# Patient Record
Sex: Female | Born: 1990 | Race: Black or African American | Hispanic: No | Marital: Single | Smoking: Never smoker
Health system: Southern US, Community
[De-identification: ages and names within clinical notes are randomized; demographics above are authoritative.]

## PROBLEM LIST (undated history)

## (undated) ENCOUNTER — Inpatient Hospital Stay (HOSPITAL_COMMUNITY): Payer: Self-pay

## (undated) ENCOUNTER — Emergency Department (HOSPITAL_COMMUNITY): Admission: EM | Payer: Medicaid Other

## (undated) ENCOUNTER — Ambulatory Visit (HOSPITAL_COMMUNITY): Admission: EM | Payer: Medicaid Other | Source: Home / Self Care

## (undated) DIAGNOSIS — R87610 Atypical squamous cells of undetermined significance on cytologic smear of cervix (ASC-US): Secondary | ICD-10-CM

## (undated) DIAGNOSIS — R87810 Cervical high risk human papillomavirus (HPV) DNA test positive: Secondary | ICD-10-CM

## (undated) DIAGNOSIS — B009 Herpesviral infection, unspecified: Secondary | ICD-10-CM

## (undated) DIAGNOSIS — Z3490 Encounter for supervision of normal pregnancy, unspecified, unspecified trimester: Secondary | ICD-10-CM

## (undated) HISTORY — PX: EYE SURGERY: SHX253

## (undated) HISTORY — PX: TOOTH EXTRACTION: SUR596

## (undated) HISTORY — DX: Herpesviral infection, unspecified: B00.9

## (undated) HISTORY — DX: Cervical high risk human papillomavirus (HPV) DNA test positive: R87.810

## (undated) HISTORY — DX: Atypical squamous cells of undetermined significance on cytologic smear of cervix (ASC-US): R87.610

---
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
## 2008-03-12 ENCOUNTER — Other Ambulatory Visit: Admission: RE | Admit: 2008-03-12 | Discharge: 2008-03-12 | Payer: Self-pay | Admitting: Family Medicine

## 2009-03-17 ENCOUNTER — Other Ambulatory Visit: Admission: RE | Admit: 2009-03-17 | Discharge: 2009-03-17 | Payer: Self-pay | Admitting: Family Medicine

## 2009-05-18 ENCOUNTER — Emergency Department (HOSPITAL_COMMUNITY): Admission: EM | Admit: 2009-05-18 | Discharge: 2009-05-18 | Payer: Self-pay | Admitting: Emergency Medicine

## 2010-02-11 ENCOUNTER — Inpatient Hospital Stay (INDEPENDENT_AMBULATORY_CARE_PROVIDER_SITE_OTHER)
Admission: RE | Admit: 2010-02-11 | Discharge: 2010-02-11 | Disposition: A | Discharge status: Home / Self Care | Payer: Medicaid Other | Source: Ambulatory Visit | Attending: Family Medicine | Admitting: Family Medicine

## 2010-02-11 DIAGNOSIS — J020 Streptococcal pharyngitis: Secondary | ICD-10-CM

## 2010-02-11 LAB — POCT RAPID STREP A (OFFICE): Streptococcus, Group A Screen (Direct): POSITIVE — AB

## 2010-02-12 ENCOUNTER — Emergency Department (HOSPITAL_COMMUNITY)
Admission: EM | Admit: 2010-02-12 | Discharge: 2010-02-12 | Disposition: A | Discharge status: Home / Self Care | Payer: Medicaid Other | Attending: Emergency Medicine | Admitting: Emergency Medicine

## 2010-02-12 DIAGNOSIS — R220 Localized swelling, mass and lump, head: Secondary | ICD-10-CM | POA: Insufficient documentation

## 2010-02-12 DIAGNOSIS — R599 Enlarged lymph nodes, unspecified: Secondary | ICD-10-CM | POA: Insufficient documentation

## 2010-02-12 DIAGNOSIS — J3489 Other specified disorders of nose and nasal sinuses: Secondary | ICD-10-CM | POA: Insufficient documentation

## 2010-02-12 DIAGNOSIS — R509 Fever, unspecified: Secondary | ICD-10-CM | POA: Insufficient documentation

## 2010-02-12 DIAGNOSIS — R110 Nausea: Secondary | ICD-10-CM | POA: Insufficient documentation

## 2010-02-12 DIAGNOSIS — R630 Anorexia: Secondary | ICD-10-CM | POA: Insufficient documentation

## 2010-02-12 DIAGNOSIS — J020 Streptococcal pharyngitis: Secondary | ICD-10-CM | POA: Insufficient documentation

## 2010-02-12 DIAGNOSIS — F988 Other specified behavioral and emotional disorders with onset usually occurring in childhood and adolescence: Secondary | ICD-10-CM | POA: Insufficient documentation

## 2010-02-12 DIAGNOSIS — R499 Unspecified voice and resonance disorder: Secondary | ICD-10-CM | POA: Insufficient documentation

## 2010-02-12 DIAGNOSIS — R1310 Dysphagia, unspecified: Secondary | ICD-10-CM | POA: Insufficient documentation

## 2010-02-12 LAB — RAPID STREP SCREEN (MED CTR MEBANE ONLY): Streptococcus, Group A Screen (Direct): POSITIVE — AB

## 2010-03-22 LAB — STREP A DNA PROBE: Group A Strep Probe: NEGATIVE

## 2010-03-22 LAB — POCT RAPID STREP A (OFFICE): Streptococcus, Group A Screen (Direct): NEGATIVE

## 2010-03-24 ENCOUNTER — Other Ambulatory Visit (HOSPITAL_COMMUNITY)
Admission: RE | Admit: 2010-03-24 | Discharge: 2010-03-24 | Disposition: A | Discharge status: Home / Self Care | Payer: Medicaid Other | Source: Ambulatory Visit | Attending: Obstetrics and Gynecology | Admitting: Obstetrics and Gynecology

## 2010-03-24 ENCOUNTER — Other Ambulatory Visit: Payer: Self-pay | Admitting: Nurse Practitioner

## 2010-03-24 DIAGNOSIS — Z01419 Encounter for gynecological examination (general) (routine) without abnormal findings: Secondary | ICD-10-CM | POA: Insufficient documentation

## 2010-03-24 DIAGNOSIS — Z113 Encounter for screening for infections with a predominantly sexual mode of transmission: Secondary | ICD-10-CM | POA: Insufficient documentation

## 2010-05-21 NOTE — Op Note (Signed)
NAMESELIA, WAREING           ACCOUNT NO.:  [id number]   MEDICAL RECORD NO.:  [id number]          PATIENT TYPE:  AMB   LOCATION:  DSC                          FACILITY:  [facility]   PHYSICIAN:  Pasty Spillers. Maple Hudson, M.D. DATE OF BIRTH:  Dec 31, 1990   DATE OF PROCEDURE:  12/24/2004  DATE OF DISCHARGE:                                 OPERATIVE REPORT   PREOPERATIVE DIAGNOSIS:  Residual esotropia.   POSTOPERATIVE DIAGNOSIS:  Residual esotropia.   PROCEDURE:  Right medial rectus muscle re-recession, 3.5 mm.   SURGEON:  Pasty Spillers. Maple Hudson, M.D.   ANESTHESIA:  General (laryngeal mask).   COMPLICATIONS:  None.   PROCEDURE:  After routine prep evaluation including informed consent from  the mother, the patient was taken to the operating room, where she was  identified by me.  General anesthesia was induced without difficulty after  placement of appropriate monitors.  The patient was prepped and draped in a  standard sterile fashion.  A lid speculum placed in the right eye.   Through an inferonasal fornix incision through conjunctiva and Tenon's  fascia, the right medial rectus muscle was engaged on a series of muscle  hooks and cleared of its fascial attachments.  The tendon was secured with a  double-arm 6-0 Vicryl suture, with a double-locking bite at each border of  the muscle, 1 mm from the insertion.  The muscle was disinserted.  Its  current insertion was found to be 9.5 mm posterior to the limbus.  The  muscle was reattached to sclera at a measured distance of 3.5 mm posterior  to the current insertion (i.e. 13.0 mm posterior to the limbus), using  direct scleral passes in crossed-swords fashion.  The sutures ends were tied  securely after the position of muscle been checked and found to be accurate.  Conjunctiva was closed with two 6-0 Vicryl sutures.  TobraDex ointment was  placed in the eye.  The patient was awakened without difficulty and taken to  the recovery room in stable  condition, having suffered no intraoperative or  immediate postoperative complications.      Pasty Spillers. Maple Hudson, M.D.  Electronically Signed     WOY/MEDQ  D:  12/24/2004  T:  12/27/2004  Job:  [id number]

## 2010-10-25 ENCOUNTER — Inpatient Hospital Stay (HOSPITAL_COMMUNITY)
Admission: AD | Admit: 2010-10-25 | Discharge: 2010-10-25 | Disposition: A | Discharge status: Home / Self Care | Payer: Medicaid Other | Source: Ambulatory Visit | Attending: Obstetrics & Gynecology | Admitting: Obstetrics & Gynecology

## 2010-10-25 ENCOUNTER — Encounter (HOSPITAL_COMMUNITY): Payer: Self-pay | Admitting: *Deleted

## 2010-10-25 DIAGNOSIS — N898 Other specified noninflammatory disorders of vagina: Secondary | ICD-10-CM | POA: Insufficient documentation

## 2010-10-25 DIAGNOSIS — A5901 Trichomonal vulvovaginitis: Secondary | ICD-10-CM

## 2010-10-25 LAB — URINALYSIS, ROUTINE W REFLEX MICROSCOPIC
Bilirubin Urine: NEGATIVE
Ketones, ur: 15 mg/dL — AB
Nitrite: NEGATIVE
Protein, ur: NEGATIVE mg/dL
Urobilinogen, UA: 0.2 mg/dL (ref 0.0–1.0)

## 2010-10-25 LAB — URINE MICROSCOPIC-ADD ON

## 2010-10-25 LAB — WET PREP, GENITAL: Clue Cells Wet Prep HPF POC: NONE SEEN

## 2010-10-25 MED ORDER — METRONIDAZOLE 500 MG PO TABS: 500.0000 mg | ORAL_TABLET | Freq: Two times a day (BID) | ORAL | Status: AC

## 2010-10-25 NOTE — Progress Notes (Signed)
Has not come on period, lmp 09/15, did home test was neg.  Thinks yeast infection, odor, itching and d/c.  Has tried OTC treatment and it is not helping.  Throat is scratchy and red- wants throat culture.

## 2010-10-25 NOTE — ED Provider Notes (Signed)
History     CSN: [id number] Arrival date & time: 10/25/2010  3:46 PM   None     Chief Complaint  Patient presents with  . Vaginal Discharge   HPI Diana Jacobs is a 20 y.o. female who presents to MAU for vaginal itching burning and irritation that started 3 weeks. The symptoms have gotten worse. Patient states that it feels like a yeast infection but much worse. The discharge is watery and causing burning in the vaginal area. Current sex partner x 7 months.   Past Medical History  Diagnosis Date  . No pertinent past medical history     Past Surgical History  Procedure Date  . Eye surgery   . Tooth extraction     No family history on file.  History  Substance Use Topics  . Smoking status: Never Smoker   . Smokeless tobacco: Not on file  . Alcohol Use: No    OB History    Grav Para Term Preterm Abortions TAB SAB Ect Mult Living   0               Review of Systems  HENT: Negative.   Eyes: Negative.   Respiratory: Negative.   Cardiovascular: Negative.   Gastrointestinal: Negative for nausea, vomiting, abdominal pain, diarrhea and constipation.  Genitourinary: Positive for dysuria, vaginal discharge and vaginal pain. Negative for urgency, frequency, vaginal bleeding and pelvic pain.  Musculoskeletal: Negative for back pain.  Skin: Negative.   Neurological: Negative.   Psychiatric/Behavioral: Negative for confusion and agitation.    Allergies  Review of patient's allergies indicates no known allergies.  Home Medications  No current outpatient prescriptions on file.  BP 117/59  Pulse 82  Temp(Src) 97.8 F (36.6 C) (Oral)  Resp 18  Ht 5' 4.5" (1.638 m)  Wt 196 lb (88.905 kg)  BMI 33.12 kg/m2  LMP 09/18/2010  Physical Exam  Nursing note and vitals reviewed. Constitutional: She is oriented to person, place, and time. She appears well-developed and well-nourished. No distress.  HENT:  Head: Normocephalic.  Eyes: EOM are normal.  Neck: Neck supple.   Cardiovascular: Normal rate.   Pulmonary/Chest: Effort normal.  Abdominal: Soft. There is no tenderness.  Genitourinary:       External genitalia without lesions. Frothy yellow vaginal discharge. Cervix friable with strawberry appearence. No CMT, no adnexal tenderness. Uterus without palpable enlargement.   Musculoskeletal: Normal range of motion.  Neurological: She is alert and oriented to person, place, and time. No cranial nerve deficit.  Skin: Skin is warm and dry.    ED Course  Procedures    Results for orders placed during the hospital encounter of 10/25/10 (from the past 24 hour(s))  URINALYSIS, ROUTINE W REFLEX MICROSCOPIC     Status: Abnormal   Collection Time   10/25/10  4:30 PM      Component Value Range   Color, Urine YELLOW  YELLOW    Appearance HAZY (*) CLEAR    Specific Gravity, Urine >1.030 (*) 1.005 - 1.030    pH 6.0  5.0 - 8.0    Glucose, UA NEGATIVE  NEGATIVE (mg/dL)   Hgb urine dipstick TRACE (*) NEGATIVE    Bilirubin Urine NEGATIVE  NEGATIVE    Ketones, ur 15 (*) NEGATIVE (mg/dL)   Protein, ur NEGATIVE  NEGATIVE (mg/dL)   Urobilinogen, UA 0.2  0.0 - 1.0 (mg/dL)   Nitrite NEGATIVE  NEGATIVE    Leukocytes, UA MODERATE (*) NEGATIVE   URINE MICROSCOPIC-ADD ON  Status: Abnormal   Collection Time   10/25/10  4:30 PM      Component Value Range   Squamous Epithelial / LPF FEW (*) RARE    WBC, UA 7-10  <3 (WBC/hpf)   RBC / HPF 0-2  <3 (RBC/hpf)   Bacteria, UA FEW (*) RARE    Urine-Other TRICHOMONAS PRESENT    POCT PREGNANCY, URINE     Status: Normal   Collection Time   10/25/10  4:40 PM      Component Value Range   Preg Test, Ur NEGATIVE    WET PREP, GENITAL     Status: Abnormal   Collection Time   10/25/10  4:50 PM      Component Value Range   Yeast, Wet Prep NONE SEEN  NONE SEEN    Trich, Wet Prep MANY (*) NONE SEEN    Clue Cells, Wet Prep NONE SEEN  NONE SEEN    WBC, Wet Prep HPF POC MODERATE (*) NONE SEEN     Assessment:  Trichomonas  vaginitis  Plan:  Flagyl 500 mg. Po bid x 7 days   Discussed need for partner treatment   GC, Chlamydia cultures pending   Follow up with STD clinic  MDM          Kerrie Buffalo, NP 10/25/10 1732

## 2011-01-24 ENCOUNTER — Encounter (HOSPITAL_COMMUNITY): Payer: Self-pay | Admitting: *Deleted

## 2011-01-24 ENCOUNTER — Inpatient Hospital Stay (HOSPITAL_COMMUNITY)
Admission: AD | Admit: 2011-01-24 | Discharge: 2011-01-24 | Disposition: A | Discharge status: Home / Self Care | Payer: Medicaid Other | Source: Ambulatory Visit | Attending: Obstetrics & Gynecology | Admitting: Obstetrics & Gynecology

## 2011-01-24 DIAGNOSIS — O99891 Other specified diseases and conditions complicating pregnancy: Secondary | ICD-10-CM | POA: Insufficient documentation

## 2011-01-24 DIAGNOSIS — Z3400 Encounter for supervision of normal first pregnancy, unspecified trimester: Secondary | ICD-10-CM

## 2011-01-24 MED ORDER — PRENATAL RX 60-1 MG PO TABS: 1.0000 | ORAL_TABLET | Freq: Every day | ORAL | Status: DC

## 2011-01-24 NOTE — ED Provider Notes (Signed)
Diana Trinka Hammonds20 y.o.G2P0 @[redacted]w[redacted]d  Chief Complaint  Patient presents with  . Possible Pregnancy    SUBJECTIVE  HPI:  Had pos HPT and requests pregnnacy verification letter. No vaginal bleeding or abd pain. Happy about pregnancy.   Past Medical History  Diagnosis Date  . No pertinent past medical history    Past Surgical History  Procedure Date  . Eye surgery   . Tooth extraction    History   Social History  . Marital Status: Single    Spouse Name: N/A    Number of Children: N/A  . Years of Education: N/A   Occupational History  . Not on file.   Social History Main Topics  . Smoking status: Never Smoker   . Smokeless tobacco: Not on file  . Alcohol Use: No  . Drug Use: No  . Sexually Active: Yes   Other Topics Concern  . Not on file   Social History Narrative  . No narrative on file   No current facility-administered medications on file prior to encounter.   Current Outpatient Prescriptions on File Prior to Encounter  Medication Sig Dispense Refill  . miconazole (MONISTAT 7) 100 MG vaginal suppository Place 100 mg vaginally at bedtime. Patient states that she was using this medication for a yeast infections.       No Known Allergies  ROS: Pertinent items in HPI  OBJECTIVE  BP 127/67  Pulse 91  Temp(Src) 99.2 F (37.3 C) (Oral)  Resp 16  Ht 5\' 4"  (1.626 m)  Wt 92.352 kg (203 lb 9.6 oz)  BMI 34.95 kg/m2  SpO2 99%  LMP 12/15/2010  Complete PE deferred NAD Abd soft, NT  Results for orders placed during the hospital encounter of 01/24/11 (from the past 24 hour(s))  POCT PREGNANCY, URINE     Status: Normal   Collection Time   01/24/11  5:23 PM      Component Value Range   Preg Test, Ur POSITIVE     ASSESSMENT   Early pregnancy [redacted]w[redacted]d by LMP  PLAN  Preg verification letter, PNV Rx, pregnancy precautions, list of providers given

## 2011-01-24 NOTE — Progress Notes (Signed)
Patient states she had a positive pregnancy test yesterday at home, wants confirmation. Had vomiting one time on 1-19 and has some nausea on and off. Able to eat and drink with no problems.

## 2011-01-31 ENCOUNTER — Encounter (HOSPITAL_COMMUNITY): Payer: Self-pay | Admitting: *Deleted

## 2011-01-31 ENCOUNTER — Inpatient Hospital Stay (HOSPITAL_COMMUNITY): Payer: Medicaid Other

## 2011-01-31 ENCOUNTER — Inpatient Hospital Stay (HOSPITAL_COMMUNITY)
Admission: AD | Admit: 2011-01-31 | Discharge: 2011-01-31 | Disposition: A | Discharge status: Home / Self Care | Payer: Medicaid Other | Source: Ambulatory Visit | Attending: Obstetrics & Gynecology | Admitting: Obstetrics & Gynecology

## 2011-01-31 DIAGNOSIS — B9689 Other specified bacterial agents as the cause of diseases classified elsewhere: Secondary | ICD-10-CM | POA: Insufficient documentation

## 2011-01-31 DIAGNOSIS — A499 Bacterial infection, unspecified: Secondary | ICD-10-CM

## 2011-01-31 DIAGNOSIS — N760 Acute vaginitis: Secondary | ICD-10-CM | POA: Insufficient documentation

## 2011-01-31 DIAGNOSIS — O2390 Unspecified genitourinary tract infection in pregnancy, unspecified trimester: Secondary | ICD-10-CM | POA: Insufficient documentation

## 2011-01-31 DIAGNOSIS — O200 Threatened abortion: Secondary | ICD-10-CM | POA: Insufficient documentation

## 2011-01-31 LAB — CBC
HCT: 40.1 % (ref 36.0–46.0)
Hemoglobin: 13.7 g/dL (ref 12.0–15.0)
MCH: 29.7 pg (ref 26.0–34.0)
MCHC: 34.2 g/dL (ref 30.0–36.0)
RBC: 4.62 MIL/uL (ref 3.87–5.11)

## 2011-01-31 LAB — URINE MICROSCOPIC-ADD ON

## 2011-01-31 LAB — HCG, QUANTITATIVE, PREGNANCY: hCG, Beta Chain, Quant, S: 19004 m[IU]/mL — ABNORMAL HIGH (ref <5)

## 2011-01-31 LAB — URINALYSIS, ROUTINE W REFLEX MICROSCOPIC
Bilirubin Urine: NEGATIVE
Glucose, UA: NEGATIVE mg/dL
Ketones, ur: NEGATIVE mg/dL
Leukocytes, UA: NEGATIVE
Specific Gravity, Urine: >1.03 — ABNORMAL HIGH (ref 1.005–1.030)
pH: 6 (ref 5.0–8.0)

## 2011-01-31 LAB — WET PREP, GENITAL
Trich, Wet Prep: NONE SEEN
Yeast Wet Prep HPF POC: NONE SEEN

## 2011-01-31 MED ORDER — PRENATAL RX 60-1 MG PO TABS: 1.0000 | ORAL_TABLET | Freq: Every day | ORAL | Status: DC

## 2011-01-31 MED ORDER — METRONIDAZOLE 500 MG PO TABS: 500.0000 mg | ORAL_TABLET | Freq: Three times a day (TID) | ORAL | Status: DC

## 2011-01-31 NOTE — ED Provider Notes (Signed)
History   Diana Jacobs is a 21 y.o. year old G89P0 female at [redacted]w[redacted]d weeks gestation by LMP who presents to MAU reporting bright red VB w/ small clots and mild cramping this morning. She has not had any ultrasounds this pregnancy and is worried that she may be miscarrying. She denies passage of tissue.    CSN: [id number]  Arrival date & time 01/31/11  0703   None     Chief Complaint  Patient presents with  . Abdominal Cramping  . Vaginal Bleeding    (Consider location/radiation/quality/duration/timing/severity/associated sxs/prior treatment) HPI  Past Medical History  Diagnosis Date  . No pertinent past medical history     Past Surgical History  Procedure Date  . Eye surgery   . Tooth extraction     No family history on file.  History  Substance Use Topics  . Smoking status: Never Smoker   . Smokeless tobacco: Not on file  . Alcohol Use: No    OB History    Grav Para Term Preterm Abortions TAB SAB Ect Mult Living   1               Review of Systems: Otherwise neg  Allergies  Review of patient's allergies indicates no known allergies.  Home Medications  No current outpatient prescriptions on file.  BP 121/74  Pulse 86  Temp(Src) 99 F (37.2 C) (Oral)  Resp 16  Ht 5\' 4"  (1.626 m)  Wt 92.352 kg (203 lb 9.6 oz)  BMI 34.95 kg/m2  SpO2 99%  LMP 12/15/2010  Physical Exam  Constitutional: She is oriented to person, place, and time. She appears well-developed and well-nourished. No distress.  Cardiovascular: Normal rate.   Pulmonary/Chest: Effort normal.  Abdominal: Soft. Bowel sounds are normal. There is no tenderness.  Genitourinary:       No blood on pad.  Pelvic exam: Cervix visually closed, pink, without lesion or injury, no bleeding noted in vaginal vault.    Bimanual exam: Cervix 0/th/hi, adnexa with no masses or enlargement, uterus slightly enlarged from nonpregnant size  Neurological: She is alert and oriented to person, place, and time.    Skin: Skin is warm and dry.  Psychiatric: She has a normal mood and affect.    ED Course  Procedures (including critical care time)  [phone]: To US Pelvic exam with wet prep, GC/Chlamydia  Results for orders placed during the hospital encounter of 01/31/11 (from the past 24 hour(s))  URINALYSIS, ROUTINE W REFLEX MICROSCOPIC     Status: Abnormal   Collection Time   01/31/11  7:20 AM      Component Value Range   Color, Urine YELLOW  YELLOW    APPearance CLEAR  CLEAR    Specific Gravity, Urine >1.030 (*) 1.005 - 1.030    pH 6.0  5.0 - 8.0    Glucose, UA NEGATIVE  NEGATIVE (mg/dL)   Hgb urine dipstick LARGE (*) NEGATIVE    Bilirubin Urine NEGATIVE  NEGATIVE    Ketones, ur NEGATIVE  NEGATIVE (mg/dL)   Protein, ur NEGATIVE  NEGATIVE (mg/dL)   Urobilinogen, UA 0.2  0.0 - 1.0 (mg/dL)   Nitrite NEGATIVE  NEGATIVE    Leukocytes, UA NEGATIVE  NEGATIVE   URINE MICROSCOPIC-ADD ON     Status: Abnormal   Collection Time   01/31/11  7:20 AM      Component Value Range   Squamous Epithelial / LPF FEW (*) RARE    RBC / HPF 3-6  <3 (RBC/hpf)  CBC     Status: Normal   Collection Time   01/31/11  8:31 AM      Component Value Range   WBC 5.8  4.0 - 10.5 (K/uL)   RBC 4.62  3.87 - 5.11 (MIL/uL)   Hemoglobin 13.7  12.0 - 15.0 (g/dL)   HCT 16.1  09.6 - 04.5 (%)   MCV 86.8  78.0 - 100.0 (fL)   MCH 29.7  26.0 - 34.0 (pg)   MCHC 34.2  30.0 - 36.0 (g/dL)   RDW 40.9  81.1 - 91.4 (%)   Platelets 373  150 - 400 (K/uL)  ABO/RH     Status: Normal   Collection Time   01/31/11  8:31 AM      Component Value Range   ABO/RH(D) B POS    HCG, QUANTITATIVE, PREGNANCY     Status: Abnormal   Collection Time   01/31/11  8:31 AM      Component Value Range   hCG, Beta Chain, Quant, S 19004 (*) <5 (mIU/mL)  WET PREP, GENITAL     Status: Abnormal   Collection Time   01/31/11  9:06 AM      Component Value Range   Yeast, Wet Prep NONE SEEN  NONE SEEN    Trich, Wet Prep NONE SEEN  NONE SEEN    Clue Cells, Wet Prep  MODERATE (*) NONE SEEN    WBC, Wet Prep HPF POC FEW (*) NONE SEEN     U/S indicates IUP [redacted]w[redacted]d with fetal pole and gestational sac visualized but no cardiac activity visualized.       MDM  Care of pt turned over to [facility], CNM.  Dorathy Kinsman 01/31/2011 7:46 AM   A: Threatened abortion Bacterial vaginosis  P: D/C home with bleeding precautions Return to MAU for quantitative hCG in 48 hours Flagyl 500 mg BID for 7 days

## 2011-01-31 NOTE — Progress Notes (Signed)
Patient states she started having a little cramping yesterday. This morning after wiping noted dark red blood on tissue. Not wearing a pad at this time.

## 2011-02-01 LAB — GC/CHLAMYDIA PROBE AMP, GENITAL: GC Probe Amp, Genital: NEGATIVE

## 2011-02-02 ENCOUNTER — Inpatient Hospital Stay (HOSPITAL_COMMUNITY)
Admission: AD | Admit: 2011-02-02 | Discharge: 2011-02-02 | Disposition: A | Discharge status: Home / Self Care | Payer: Medicaid Other | Source: Ambulatory Visit | Attending: Obstetrics and Gynecology | Admitting: Obstetrics and Gynecology

## 2011-02-02 ENCOUNTER — Inpatient Hospital Stay (HOSPITAL_COMMUNITY): Payer: Medicaid Other

## 2011-02-02 DIAGNOSIS — O021 Missed abortion: Secondary | ICD-10-CM | POA: Insufficient documentation

## 2011-02-02 MED ORDER — IBUPROFEN 600 MG PO TABS: 600.0000 mg | ORAL_TABLET | Freq: Four times a day (QID) | ORAL | Status: AC | PRN

## 2011-02-02 MED ORDER — PROMETHAZINE HCL 25 MG PO TABS: 25.0000 mg | ORAL_TABLET | Freq: Four times a day (QID) | ORAL | Status: AC | PRN

## 2011-02-02 MED ORDER — HYDROCODONE-ACETAMINOPHEN 5-325 MG PO TABS: 2.0000 | ORAL_TABLET | ORAL | Status: AC | PRN

## 2011-02-02 MED ORDER — PROMETHAZINE HCL 25 MG PO TABS: 25.0000 mg | ORAL_TABLET | Freq: Four times a day (QID) | ORAL | Status: DC | PRN

## 2011-02-02 MED ORDER — MISOPROSTOL 200 MCG PO TABS
800.0000 ug | ORAL_TABLET | Freq: Once | ORAL | Status: AC
  Administered 2011-02-02: 800 ug via VAGINAL
  Filled 2011-02-02: qty 4

## 2011-02-02 NOTE — ED Notes (Signed)
Patient taken to room #6 by Lilyan Punt, NP to discuss plan of care and place Cytotec.

## 2011-02-02 NOTE — Progress Notes (Signed)
Patient to MAU for repeat BHCG. Patient denies any pain or bleeding.  

## 2011-02-02 NOTE — ED Provider Notes (Signed)
History     Chief Complaint  Patient presents with  . Follow-up   HPI Diana Jacobs 21 y.o. returns to MAU today for repeat quant.  Was seen on 01-31-11 with some vaginal bleeding.  Quant was 19,000 and ultrasound showed IUGS with yolk sac and fetal pole, but no FHT seen.  Today is not having pain or vaginal bleeding.     OB History    Grav Para Term Preterm Abortions TAB SAB Ect Mult Living   1               Past Medical History  Diagnosis Date  . No pertinent past medical history     Past Surgical History  Procedure Date  . Eye surgery   . Tooth extraction     No family history on file.  History  Substance Use Topics  . Smoking status: Never Smoker   . Smokeless tobacco: Never Used  . Alcohol Use: No    Allergies: No Known Allergies  Prescriptions prior to admission  Medication Sig Dispense Refill  . metroNIDAZOLE (FLAGYL) 500 MG tablet Take 1 tablet (500 mg total) by mouth 3 (three) times daily.  14 tablet  0  . Prenatal Vit-Fe Fumarate-FA (PRENATAL MULTIVITAMIN) 60-1 MG tablet Take 1 tablet by mouth daily.  60 tablet  0    ROS Physical Exam   Blood pressure 119/77, pulse 97, temperature 98.5 F (36.9 C), temperature source Oral, resp. rate 16, last menstrual period 12/15/2010, SpO2 97.00%.  Physical Exam  Nursing note and vitals reviewed. Constitutional: She is oriented to person, place, and time. She appears well-developed and well-nourished.  HENT:  Head: Normocephalic.  Eyes: EOM are normal.  Neck: Neck supple.  Musculoskeletal: Normal range of motion.  Neurological: She is alert and oriented to person, place, and time.  Skin: Skin is warm and dry.  Psychiatric: She has a normal mood and affect.    MAU Course  Procedures Results for YOSHIYE, KRAFT (MRN [id number]) as of 02/02/2011 12:16  Ref. Range 01/31/2011 08:31 02/02/2011 10:05  hCG, Beta Chain, Quant, S Latest Range: <5 mIU/mL 19004 (H) 23394 (H)   Ultrasound - 7w 0d IUGS with  yolk sac and no fetal pole.  MDM Consult with Dr. Jolayne Panther.  Explained to client that the pregnancy is not progressing, no fetal pole seen today on ultrasound.  Discussed options for care and client wants to use cytotec.  Reviewed side effects and expected bleeding and cramping.  Gave written info on cytotec.  Will place Cytotec today and client to go home.  Blood type B +.  Hgb 13 on 01-31-11.         Early Intrauterine Pregnancy Failure  _X__  Documented intrauterine pregnancy failure less than or equal to [redacted] weeks gestation  _X__  No serious current illness  _X__  Baseline Hgb greater than or equal to 10g/dl  _X__  Patient has easily accessible transportation to the hospital  _X__  Clear preference  _X__  Practitioner/physician deems patient reliable  _X__  Counseling by practitioner or physician  ___  Patient education by RN  _X__  Consent form signed  _NA__  Rho-Gam given by RN if indicated  _X__ Medication dispensed   __X_   Cytotec 800 mcg  __   Intravaginally by patient at home         _X_   Intravaginally by NP in MAU        __   Rectally by patient  at home        __   Rectally by RN in MAU  ___  Ibuprofen 600 mg 1 tablet by mouth every 6 hours as needed #30 - prescribed ___  Hydrocodone/acetaminophen 5/325 mg by mouth every 4 to 6 hours as needed - prescribed ___  Phenergan 12.5 mg by mouth every 4 hours as needed for nausea - prescribed   Cytotec 800 mcg placed vaginally by NP.   Assessment and Plan  Inadequate rise in quant Ultrasound showing no progression of pregnancy.  Plan cytotec for early pregnancy failure Will send message to GYN clinic for follow up.  Diana Jacobs 02/02/2011, 12:19 PM   Nolene Bernheim, NP 02/02/11 1354

## 2011-02-04 NOTE — ED Provider Notes (Signed)
Agree with above note.  Diana Jacobs 02/04/2011 5:53 AM

## 2011-02-18 ENCOUNTER — Ambulatory Visit: Payer: Medicaid Other | Admitting: Family

## 2011-03-04 ENCOUNTER — Ambulatory Visit (INDEPENDENT_AMBULATORY_CARE_PROVIDER_SITE_OTHER): Payer: Self-pay | Admitting: Family

## 2011-03-04 ENCOUNTER — Encounter: Payer: Self-pay | Admitting: Family

## 2011-03-04 VITALS — BP 128/91 | HR 81 | Temp 97.2°F | Ht 64.5 in | Wt 203.8 lb

## 2011-03-04 DIAGNOSIS — O039 Complete or unspecified spontaneous abortion without complication: Secondary | ICD-10-CM | POA: Insufficient documentation

## 2011-03-04 NOTE — Progress Notes (Signed)
  Subjective:    Patient ID: Diana Jacobs, female    DOB: 1990-04-18, 21 y.o.   MRN: [id number]  HPI Pt is here s/p cytotec placement for failed pregnancy on 02/02/11.  Pt reports bleeding x 3 weeks.  Bleeding has decreased to spotting.  Pt desires pregnancy at this time.  No reports of signs of pregnancy.     Review of Systems  Genitourinary: Positive for vaginal bleeding (scant).  All other systems reviewed and are negative.       Objective:   Physical Exam  Constitutional: Diana Jacobs is oriented to person, place, and time. Diana Jacobs appears well-developed and well-nourished. No distress.  HENT:  Head: Normocephalic.  Neck: Normal range of motion. Neck supple.  Cardiovascular: Normal rate, regular rhythm and normal heart sounds.   Pulmonary/Chest: Effort normal and breath sounds normal.  Abdominal: Soft. Bowel sounds are normal. There is no tenderness.  Genitourinary: Uterus is not enlarged. There is bleeding (scant) around the vagina.  Musculoskeletal: Normal range of motion.  Neurological: Diana Jacobs is alert and oriented to person, place, and time.  Skin: Skin is warm and dry. No pallor.   Urine pregnancy - negative       Assessment & Plan:  Complete Miscarriage  Plan: Continue prenatal vitamins Obtain care when pregnant  [facility]

## 2011-03-04 NOTE — Progress Notes (Signed)
  Subjective:    Patient ID: Diana Jacobs, female    DOB: 11/11/1990, 21 y.o.   MRN: [id number]  HPI    Review of Systems     Objective:   Physical Exam        Assessment & Plan:

## 2011-03-04 NOTE — Patient Instructions (Signed)
Miscarriage An early pregnancy loss or spontaneous abortion (miscarriage) is a common problem. This usually happens when the pregnancy is not developing normally. It is very unlikely that you or your partner did anything to cause this, although cigarette smoking, a sexually transmitted disease, excessive alcohol use, or drug abuse can increase the risk. Other causes are:  Abnormalities of the uterus.   Hormone or medical problems.   Trauma or genetic (chromosome) problems.  Having a miscarriage does not change your chances of having a normal pregnancy in the future. Your caregiver will advise you when it is safe to try to get pregnant again. AFTER A MISCARRIAGE  A miscarriage is inevitable when there is continual, heavy vaginal bleeding; cramping; dilation of the cervix; or passing of any pregnancy tissue. Bleeding and cramping will usually continue until all the tissue has been removed from the womb (uterus).   Often the uterus does not clean itself out completely. A medication or a D&C procedure is needed to loosen or remove the pregnancy tissue from the uterus. A D&C scrapes or suctions the tissue out.   If you are RH negative, you may need to have Rh immune globulin to avoid Rh problems.   You may be given medication to fight an infection if the miscarriage was due to an infection.  HOME CARE INSTRUCTIONS   You should rest in bed for the next 2 to 3 days.   Do not take tub baths or put anything in your vagina, including tampons or a douche.   Do not have sex until your caregiver approves.   Avoid exercise or heavy activities until directed by your caregiver.   Save any vaginal discharge that looks like tissue. Ask your caregiver if he or she wants to inspect the discharge.   If you and your partner are having problems with guilt or grieving, talk to your caregiver or get counseling to help you understand and cope with your pregnancy loss.   Allow enough time to grieve before  trying to get pregnant again.  SEEK IMMEDIATE MEDICAL CARE IF:   You have persistent heavy bleeding or a bad smelling vaginal discharge.   You have continued abdominal or pelvic pain.   You have an oral temperature above 102 F (38.9 C), not controlled by medicine.   You have severe weakness, fainting, or keep throwing up (vomiting).   You develop chills.   You are experiencing domestic violence.  MAKE SURE YOU:   Understand these instructions.   Will watch your condition.   Will get help right away if you are not doing well or get worse.  Document Released: 01/28/2004 Document Revised: 09/01/2010 Document Reviewed: 12/13/2007 [employer] Patient Information 2012 [employer]. 

## 2011-05-05 ENCOUNTER — Other Ambulatory Visit: Payer: Self-pay | Admitting: Nurse Practitioner

## 2011-05-05 ENCOUNTER — Other Ambulatory Visit (HOSPITAL_COMMUNITY)
Admission: RE | Admit: 2011-05-05 | Discharge: 2011-05-05 | Disposition: A | Discharge status: Home / Self Care | Payer: Self-pay | Source: Ambulatory Visit | Attending: Obstetrics and Gynecology | Admitting: Obstetrics and Gynecology

## 2011-05-05 DIAGNOSIS — N760 Acute vaginitis: Secondary | ICD-10-CM | POA: Insufficient documentation

## 2011-05-05 DIAGNOSIS — Z01419 Encounter for gynecological examination (general) (routine) without abnormal findings: Secondary | ICD-10-CM | POA: Insufficient documentation

## 2011-05-05 DIAGNOSIS — R87810 Cervical high risk human papillomavirus (HPV) DNA test positive: Secondary | ICD-10-CM | POA: Insufficient documentation

## 2011-05-05 DIAGNOSIS — Z113 Encounter for screening for infections with a predominantly sexual mode of transmission: Secondary | ICD-10-CM | POA: Insufficient documentation

## 2012-06-30 ENCOUNTER — Inpatient Hospital Stay (HOSPITAL_COMMUNITY)
Admission: AD | Admit: 2012-06-30 | Discharge: 2012-06-30 | Disposition: A | Discharge status: Home / Self Care | Payer: Medicaid Other | Source: Ambulatory Visit | Attending: Obstetrics and Gynecology | Admitting: Obstetrics and Gynecology

## 2012-06-30 ENCOUNTER — Encounter (HOSPITAL_COMMUNITY): Payer: Self-pay | Admitting: *Deleted

## 2012-06-30 DIAGNOSIS — Z3201 Encounter for pregnancy test, result positive: Secondary | ICD-10-CM | POA: Insufficient documentation

## 2012-06-30 LAB — URINALYSIS, ROUTINE W REFLEX MICROSCOPIC
Ketones, ur: NEGATIVE mg/dL
Leukocytes, UA: NEGATIVE
Protein, ur: NEGATIVE mg/dL
Urobilinogen, UA: 0.2 mg/dL (ref 0.0–1.0)

## 2012-06-30 LAB — POCT PREGNANCY, URINE: Preg Test, Ur: POSITIVE — AB

## 2012-06-30 NOTE — MAU Provider Note (Signed)
History     CSN: [id number]  Arrival date and time: 06/30/12 0830   First Provider Initiated Contact with Patient 06/30/12 [phone]      Chief Complaint  Patient presents with  . Possible Pregnancy   HPI Diana Jacobs 22 y.o. [redacted]w[redacted]d  Comes to MAU for pregnancy verification.  Having some nausea with no vomiting.  Denies any problems.  Is not having abdominal pain or vaginal bleeding.  Does not have Mediciad.   OB History   Grav Para Term Preterm Abortions TAB SAB Ect Mult Living   2    1  1          Past Medical History  Diagnosis Date  . No pertinent past medical history   . Medical history non-contributory     Past Surgical History  Procedure Laterality Date  . Eye surgery    . Tooth extraction      Family History  Problem Relation Age of Onset  . Hypertension Neg Hx   . Diabetes Neg Hx     History  Substance Use Topics  . Smoking status: Never Smoker   . Smokeless tobacco: Never Used  . Alcohol Use: No    Allergies: No Known Allergies  No prescriptions prior to admission    Review of Systems  Constitutional: Negative for fever.  Gastrointestinal: Positive for nausea. Negative for vomiting, abdominal pain, diarrhea and constipation.  Genitourinary:       No vaginal discharge. No vaginal bleeding. No dysuria.   Physical Exam   Blood pressure 132/76, pulse 71, temperature 98.4 F (36.9 C), resp. rate 18, height 5' 4.5" (1.638 m), weight 214 lb (97.07 kg), last menstrual period 05/23/2012.  Physical Exam  Nursing note and vitals reviewed. Constitutional: She is oriented to person, place, and time. She appears well-developed and well-nourished. No distress.  HENT:  Head: Normocephalic.  Eyes: EOM are normal.  Neck: Neck supple.  Musculoskeletal: Normal range of motion.  Neurological: She is alert and oriented to person, place, and time.  Skin: Skin is warm and dry.  Psychiatric: She has a normal mood and affect.    MAU Course   Procedures Results for orders placed during the hospital encounter of 06/30/12 (from the past 24 hour(s))  URINALYSIS, ROUTINE W REFLEX MICROSCOPIC     Status: Abnormal   Collection Time    06/30/12  8:30 AM      Result Value Range   Color, Urine YELLOW  YELLOW   APPearance CLEAR  CLEAR   Specific Gravity, Urine 1.025  1.005 - 1.030   pH 6.0  5.0 - 8.0   Glucose, UA NEGATIVE  NEGATIVE mg/dL   Hgb urine dipstick TRACE (*) NEGATIVE   Bilirubin Urine NEGATIVE  NEGATIVE   Ketones, ur NEGATIVE  NEGATIVE mg/dL   Protein, ur NEGATIVE  NEGATIVE mg/dL   Urobilinogen, UA 0.2  0.0 - 1.0 mg/dL   Nitrite NEGATIVE  NEGATIVE   Leukocytes, UA NEGATIVE  NEGATIVE  URINE MICROSCOPIC-ADD ON     Status: None   Collection Time    06/30/12  8:30 AM      Result Value Range   Squamous Epithelial / LPF RARE  RARE   RBC / HPF 0-2  <3 RBC/hpf   Urine-Other MUCOUS PRESENT    POCT PREGNANCY, URINE     Status: Abnormal   Collection Time    06/30/12  8:56 AM      Result Value Range   Preg Test, Ur POSITIVE (*)  NEGATIVE   MDM Discussed with client beginning prenatal care.  Given info to apply for Medicaid.  [facility] to [facility] care.  Assessment and Plan  Early pregnancy  Plan Your pregnancy test is positive.  No smoking, no drugs, no alcohol.  Take a prenatal vitamin one by mouth every day.  Eat small frequent snacks to avoid nausea.  Begin prenatal care as soon as possible. Pregnancy verification given.  Katena Petitjean 06/30/2012, 9:35 AM

## 2012-06-30 NOTE — MAU Note (Signed)
Pt reports having positive pregnancy test at home. C/o mild nausea. No other complaints

## 2012-07-01 NOTE — MAU Provider Note (Signed)
Attestation of Attending Supervision of Advanced Practitioner (CNM/NP): Evaluation and management procedures were performed by the Advanced Practitioner under my supervision and collaboration.  I have reviewed the Advanced Practitioner's note and chart, and I agree with the management and plan.  Jatziry Wechter 07/01/2012 6:34 AM

## 2012-07-23 ENCOUNTER — Ambulatory Visit (INDEPENDENT_AMBULATORY_CARE_PROVIDER_SITE_OTHER): Payer: Medicaid Other | Admitting: Obstetrics and Gynecology

## 2012-07-23 ENCOUNTER — Encounter: Payer: Self-pay | Admitting: Obstetrics and Gynecology

## 2012-07-23 VITALS — BP 126/79 | Temp 97.3°F | Wt 211.0 lb

## 2012-07-23 DIAGNOSIS — Z3401 Encounter for supervision of normal first pregnancy, first trimester: Secondary | ICD-10-CM | POA: Insufficient documentation

## 2012-07-23 DIAGNOSIS — Z3481 Encounter for supervision of other normal pregnancy, first trimester: Secondary | ICD-10-CM

## 2012-07-23 DIAGNOSIS — Z3480 Encounter for supervision of other normal pregnancy, unspecified trimester: Secondary | ICD-10-CM

## 2012-07-23 LAB — POCT URINALYSIS DIP (DEVICE)
Glucose, UA: NEGATIVE mg/dL
Ketones, ur: NEGATIVE mg/dL
Protein, ur: NEGATIVE mg/dL
Specific Gravity, Urine: 1.025 (ref 1.005–1.030)
Urobilinogen, UA: 0.2 mg/dL (ref 0.0–1.0)

## 2012-07-23 LAB — US OB LIMITED

## 2012-07-23 MED ORDER — CONCEPT OB 130-92.4-1 MG PO CAPS: 1.0000 | ORAL_CAPSULE | ORAL | Status: DC

## 2012-07-23 NOTE — Patient Instructions (Signed)
Pregnancy - First Trimester  During sexual intercourse, millions of sperm go into the vagina. Only 1 sperm will penetrate and fertilize the female egg while it is in the Fallopian tube. One week later, the fertilized egg implants into the wall of the uterus. An embryo begins to develop into a baby. At 6 to 8 weeks, the eyes and face are formed and the heartbeat can be seen on ultrasound. At the end of 12 weeks (first trimester), all the baby's organs are formed. Now that you are pregnant, you will want to do everything you can to have a healthy baby. Two of the most important things are to get good prenatal care and follow your caregiver's instructions. Prenatal care is all the medical care you receive before the baby's birth. It is given to prevent, find, and treat problems during the pregnancy and childbirth.  PRENATAL EXAMS  · During prenatal visits, your weight, blood pressure, and urine are checked. This is done to make sure you are healthy and progressing normally during the pregnancy.  · A pregnant woman should gain 25 to 35 pounds during the pregnancy. However, if you are overweight or underweight, your caregiver will advise you regarding your weight.  · Your caregiver will ask and answer questions for you.  · Blood work, cervical cultures, other necessary tests, and a Pap test are done during your prenatal exams. These tests are done to check on your health and the probable health of your baby. Tests are strongly recommended and done for HIV with your permission. This is the virus that causes AIDS. These tests are done because medicines can be given to help prevent your baby from being born with this infection should you have been infected without knowing it. Blood work is also used to find out your blood type, previous infections, and follow your blood levels (hemoglobin).  · Low hemoglobin (anemia) is common during pregnancy. Iron and vitamins are given to help prevent this. Later in the pregnancy, blood  tests for diabetes will be done along with any other tests if any problems develop.  · You may need other tests to make sure you and the baby are doing well.  CHANGES DURING THE FIRST TRIMESTER   Your body goes through many changes during pregnancy. They vary from person to person. Talk to your caregiver about changes you notice and are concerned about. Changes can include:  · Your menstrual period stops.  · The egg and sperm carry the genes that determine what you look like. Genes from you and your partner are forming a baby. The female genes determine whether the baby is a boy or a girl.  · Your body increases in girth and you may feel bloated.  · Feeling sick to your stomach (nauseous) and throwing up (vomiting). If the vomiting is uncontrollable, call your caregiver.  · Your breasts will begin to enlarge and become tender.  · Your nipples may stick out more and become darker.  · The need to urinate more. Painful urination may mean you have a bladder infection.  · Tiring easily.  · Loss of appetite.  · Cravings for certain kinds of food.  · At first, you may gain or lose a couple of pounds.  · You may have changes in your emotions from day to day (excited to be pregnant or concerned something may go wrong with the pregnancy and baby).  · You may have more vivid and strange dreams.  HOME CARE INSTRUCTIONS   ·   It is very important to avoid all smoking, alcohol and non-prescribed drugs during your pregnancy. These affect the formation and growth of the baby. Avoid chemicals while pregnant to ensure the delivery of a healthy infant.  · Start your prenatal visits by the 12th week of pregnancy. They are usually scheduled monthly at first, then more often in the last 2 months before delivery. Keep your caregiver's appointments. Follow your caregiver's instructions regarding medicine use, blood and lab tests, exercise, and diet.  · During pregnancy, you are providing food for you and your baby. Eat regular, well-balanced  meals. Choose foods such as meat, fish, milk and other low fat dairy products, vegetables, fruits, and whole-grain breads and cereals. Your caregiver will tell you of the ideal weight gain.  · You can help morning sickness by keeping soda crackers at the bedside. Eat a couple before arising in the morning. You may want to use the crackers without salt on them.  · Eating 4 to 5 small meals rather than 3 large meals a day also may help the nausea and vomiting.  · Drinking liquids between meals instead of during meals also seems to help nausea and vomiting.  · A physical sexual relationship may be continued throughout pregnancy if there are no other problems. Problems may be early (premature) leaking of amniotic fluid from the membranes, vaginal bleeding, or belly (abdominal) pain.  · Exercise regularly if there are no restrictions. Check with your caregiver or physical therapist if you are unsure of the safety of some of your exercises. Greater weight gain will occur in the last 2 trimesters of pregnancy. Exercising will help:  · Control your weight.  · Keep you in shape.  · Prepare you for labor and delivery.  · Help you lose your pregnancy weight after you deliver your baby.  · Wear a good support or jogging bra for breast tenderness during pregnancy. This may help if worn during sleep too.  · Ask when prenatal classes are available. Begin classes when they are offered.  · Do not use hot tubs, steam rooms, or saunas.  · Wear your seat belt when driving. This protects you and your baby if you are in an accident.  · Avoid raw meat, uncooked cheese, cat litter boxes, and soil used by cats throughout the pregnancy. These carry germs that can cause birth defects in the baby.  · The first trimester is a good time to visit your dentist for your dental health. Getting your teeth cleaned is okay. Use a softer toothbrush and brush gently during pregnancy.  · Ask for help if you have financial, counseling, or nutritional needs  during pregnancy. Your caregiver will be able to offer counseling for these needs as well as refer you for other special needs.  · Do not take any medicines or herbs unless told by your caregiver.  · Inform your caregiver if there is any mental or physical domestic violence.  · Make a list of emergency phone numbers of family, friends, hospital, and police and fire departments.  · Write down your questions. Take them to your prenatal visit.  · Do not douche.  · Do not cross your legs.  · If you have to stand for long periods of time, rotate you feet or take small steps in a circle.  · You may have more vaginal secretions that may require a sanitary pad. Do not use tampons or scented sanitary pads.  MEDICINES AND DRUG USE IN PREGNANCY  ·   Take prenatal vitamins as directed. The vitamin should contain 1 milligram of folic acid. Keep all vitamins out of reach of children. Only a couple vitamins or tablets containing iron may be fatal to a baby or young child when ingested.  · Avoid use of all medicines, including herbs, over-the-counter medicines, not prescribed or suggested by your caregiver. Only take over-the-counter or prescription medicines for pain, discomfort, or fever as directed by your caregiver. Do not use aspirin, ibuprofen, or naproxen unless directed by your caregiver.  · Let your caregiver also know about herbs you may be using.  · Alcohol is related to a number of birth defects. This includes fetal alcohol syndrome. All alcohol, in any form, should be avoided completely. Smoking will cause low birth rate and premature babies.  · Street or illegal drugs are very harmful to the baby. They are absolutely forbidden. A baby born to an addicted mother will be addicted at birth. The baby will go through the same withdrawal an adult does.  · Let your caregiver know about any medicines that you have to take and for what reason you take them.  SEEK MEDICAL CARE IF:   You have any concerns or worries during your  pregnancy. It is better to call with your questions if you feel they cannot wait, rather than worry about them.  SEEK IMMEDIATE MEDICAL CARE IF:   · An unexplained oral temperature above 102° F (38.9° C) develops, or as your caregiver suggests.  · You have leaking of fluid from the vagina (birth canal). If leaking membranes are suspected, take your temperature and inform your caregiver of this when you call.  · There is vaginal spotting or bleeding. Notify your caregiver of the amount and how many pads are used.  · You develop a bad smelling vaginal discharge with a change in the color.  · You continue to feel sick to your stomach (nauseated) and have no relief from remedies suggested. You vomit blood or coffee ground-like materials.  · You lose more than 2 pounds of weight in 1 week.  · You gain more than 2 pounds of weight in 1 week and you notice swelling of your face, hands, feet, or legs.  · You gain 5 pounds or more in 1 week (even if you do not have swelling of your hands, face, legs, or feet).  · You get exposed to German measles and have never had them.  · You are exposed to fifth disease or chickenpox.  · You develop belly (abdominal) pain. Round ligament discomfort is a common non-cancerous (benign) cause of abdominal pain in pregnancy. Your caregiver still must evaluate this.  · You develop headache, fever, diarrhea, pain with urination, or shortness of breath.  · You fall or are in a car accident or have any kind of trauma.  · There is mental or physical violence in your home.  Document Released: 12/14/2000 Document Revised: 09/14/2011 Document Reviewed: 06/17/2008  Exit[employer]® Patient Information ©2014 [employer].

## 2012-07-23 NOTE — Progress Notes (Signed)
Pulse: 93

## 2012-07-23 NOTE — Progress Notes (Signed)
   Subjective:    Diana Jacobs is a G2P0010 [redacted]w[redacted]d being seen today for her first obstetrical visit.  Her obstetrical history is significant for obesity. Patient does intend to breast feed. Pregnancy history fully reviewed.  Patient reports no complaints.  Filed Vitals:   07/23/12 1356  BP: 126/79  Temp: 97.3 F (36.3 C)  Weight: 211 lb (95.709 kg)    HISTORY: OB History   Grav Para Term Preterm Abortions TAB SAB Ect Mult Living   2    1  1         # Outc Date GA Lbr Len/2nd Wgt Sex Del Anes PTL Lv   1 SAB 1/13           Comments: 7wks   2 CUR              Past Medical History  Diagnosis Date  . No pertinent past medical history   . Medical history non-contributory    Past Surgical History  Procedure Laterality Date  . Eye surgery    . Tooth extraction     Family History  Problem Relation Age of Onset  . Hypertension Neg Hx   . Diabetes Neg Hx      Exam   Gen WNWD IN NAD                         System: Breast:  normal appearance, no masses or tenderness   Skin: normal coloration and turgor, no rashes    Neurologic: oriented, normal, grossly non-focal   Extremities: normal strength, tone, and muscle mass   HEENT PERRLA   Mouth/Teeth mucous membranes moist, pharynx normal without lesions   Neck supple   Cardiovascular: regular rate and rhythm, no murmurs or gallops   Respiratory:  appears well, vitals normal, no respiratory distress, acyanotic, normal RR, ear and throat exam is normal, neck free of mass or lymphadenopathy, chest clear, no wheezing, crepitations, rhonchi, normal symmetric air entry   Abdomen: soft, non-tender; bowel sounds normal; no masses,  no organomegaly          Assessment:    Pregnancy: G2P0010 Patient Active Problem List   Diagnosis Date Noted  . Supervision of normal first pregnancy in first trimester 07/23/2012  . Miscarriage 03/04/2011        Plan:     Initial labs drawn. Prenatal vitamins. Problem list  reviewed and updated. Genetic Screening discussed Integrated Screen: ordered.  Ultrasound discussed; fetal survey: requested.  Follow up in 4 weeks. 50% of 30 min visit spent on counseling and coordination of care.  Diane to do ultrasound for viability. Pelvic and Pap next visit.   Teri Diltz 07/23/2012

## 2012-07-23 NOTE — Progress Notes (Signed)
Informal [place] for viability - +IUP, FHR = 156 per [facility] doppler.  Deirdre Poe CNM notified.  First screen scheduled on 8/18.

## 2012-07-24 LAB — OBSTETRIC PANEL
Antibody Screen: NEGATIVE
Eosinophils Absolute: 0.1 10*3/uL (ref 0.0–0.7)
Eosinophils Relative: 1 % (ref 0–5)
HCT: 38.8 % (ref 36.0–46.0)
Lymphocytes Relative: 22 % (ref 12–46)
Lymphs Abs: 1.3 10*3/uL (ref 0.7–4.0)
MCH: 29.8 pg (ref 26.0–34.0)
MCV: 86.8 fL (ref 78.0–100.0)
Monocytes Absolute: 0.5 10*3/uL (ref 0.1–1.0)
Platelets: 378 10*3/uL (ref 150–400)
RBC: 4.47 MIL/uL (ref 3.87–5.11)
Rh Type: POSITIVE
Rubella: 5.07 Index — ABNORMAL HIGH (ref <0.90)

## 2012-07-24 LAB — GC/CHLAMYDIA PROBE AMP: GC Probe RNA: NEGATIVE

## 2012-07-25 LAB — HEMOGLOBINOPATHY EVALUATION
Hgb A2 Quant: 2.7 % (ref 2.2–3.2)
Hgb A: 97.3 % (ref 96.8–97.8)

## 2012-07-26 LAB — CULTURE, OB URINE: Colony Count: >=100000

## 2012-07-28 ENCOUNTER — Inpatient Hospital Stay (HOSPITAL_COMMUNITY)
Admission: AD | Admit: 2012-07-28 | Discharge: 2012-07-28 | Disposition: A | Discharge status: Home / Self Care | Payer: Medicaid Other | Source: Ambulatory Visit | Attending: Obstetrics & Gynecology | Admitting: Obstetrics & Gynecology

## 2012-07-28 ENCOUNTER — Encounter (HOSPITAL_COMMUNITY): Payer: Self-pay

## 2012-07-28 DIAGNOSIS — O99891 Other specified diseases and conditions complicating pregnancy: Secondary | ICD-10-CM | POA: Insufficient documentation

## 2012-07-28 DIAGNOSIS — K219 Gastro-esophageal reflux disease without esophagitis: Secondary | ICD-10-CM | POA: Insufficient documentation

## 2012-07-28 DIAGNOSIS — R109 Unspecified abdominal pain: Secondary | ICD-10-CM | POA: Insufficient documentation

## 2012-07-28 LAB — COMPREHENSIVE METABOLIC PANEL
AST: 18 U/L (ref 0–37)
Albumin: 3.7 g/dL (ref 3.5–5.2)
Alkaline Phosphatase: 64 U/L (ref 39–117)
BUN: 13 mg/dL (ref 6–23)
Chloride: 96 mEq/L (ref 96–112)
Creatinine, Ser: 0.79 mg/dL (ref 0.50–1.10)
Potassium: 3.9 mEq/L (ref 3.5–5.1)
Total Protein: 7.6 g/dL (ref 6.0–8.3)

## 2012-07-28 LAB — CBC
HCT: 38.6 % (ref 36.0–46.0)
MCHC: 34.7 g/dL (ref 30.0–36.0)
Platelets: 325 10*3/uL (ref 150–400)
RDW: 12.8 % (ref 11.5–15.5)
WBC: 8.1 10*3/uL (ref 4.0–10.5)

## 2012-07-28 LAB — URINALYSIS, ROUTINE W REFLEX MICROSCOPIC
Glucose, UA: NEGATIVE mg/dL
Ketones, ur: NEGATIVE mg/dL
Leukocytes, UA: NEGATIVE
Protein, ur: NEGATIVE mg/dL
Urobilinogen, UA: 0.2 mg/dL (ref 0.0–1.0)

## 2012-07-28 MED ORDER — GI COCKTAIL ~~LOC~~
30.0000 mL | Freq: Once | ORAL | Status: AC
  Administered 2012-07-28: 30 mL via ORAL
  Filled 2012-07-28: qty 30

## 2012-07-28 MED ORDER — FAMOTIDINE 40 MG PO TABS: 40.0000 mg | ORAL_TABLET | Freq: Every day | ORAL | Status: DC

## 2012-07-28 NOTE — MAU Note (Signed)
Pt states upper abdominal pain began about an hour ago. Denies vag bleeding or d/c.

## 2012-07-28 NOTE — MAU Provider Note (Signed)
  History     CSN: [id number]  Arrival date and time: 07/28/12 2100   None     Chief Complaint  Patient presents with  . Abdominal Pain   HPI  Pt is a G2P0010 at 9.3 wks IUP (confirmed by ultrasound by Diane [facility] in [facility]) here with report of mid upper quadrant abdominal pain that started today.  Pain is difficult to describe per pt; rated a 12 out of 10.  Pain increased after eating.  Nothing improves the pain.  Denies nausea or vomiting.    Past Medical History  Diagnosis Date  . No pertinent past medical history   . Medical history non-contributory     Past Surgical History  Procedure Laterality Date  . Eye surgery    . Tooth extraction      Family History  Problem Relation Age of Onset  . Hypertension Neg Hx   . Diabetes Neg Hx     History  Substance Use Topics  . Smoking status: Never Smoker   . Smokeless tobacco: Never Used  . Alcohol Use: No    Allergies: No Known Allergies  Prescriptions prior to admission  Medication Sig Dispense Refill  . Prenat w/o A Vit-FeFum-FePo-FA (CONCEPT OB) 130-92.4-1 MG CAPS Take 1 tablet by mouth 1 day or 1 dose.  30 capsule  5    Review of Systems  Constitutional: Negative for fever and chills.  Cardiovascular: Negative for chest pain.  Gastrointestinal: Positive for abdominal pain. Negative for nausea, vomiting, diarrhea and constipation.  All other systems reviewed and are negative.   Physical Exam   Blood pressure 124/68, pulse 72, temperature 97.9 F (36.6 C), temperature source Oral, resp. rate 18, height 5\' 4"  (1.626 m), weight 96.435 kg (212 lb 9.6 oz), last menstrual period 05/23/2012, SpO2 100.00%.  Physical Exam  Constitutional: She is oriented to person, place, and time. She appears well-developed and well-nourished. No distress.  Appears comfortable  HENT:  Head: Normocephalic.  Neck: Normal range of motion. Neck supple.  Cardiovascular: Normal rate, regular rhythm and normal heart sounds.    Respiratory: Effort normal and breath sounds normal.  GI: Soft. She exhibits distension. There is tenderness (Mid upper quadrant) in the epigastric area. There is no rebound.  Genitourinary: No bleeding around the vagina.  Neurological: She is alert and oriented to person, place, and time.  Skin: Skin is warm and dry.    MAU Course  Procedures  2135 CBC, CMP, and GI Cocktail ordered. 2225 Report improvement in pain after GI Cocktail.    Assessment and Plan  GERD  Plan: DC to home RX Pepcid Keep scheduled appointment with [facility]  [facility] 07/28/2012, 9:27 PM

## 2012-07-29 NOTE — MAU Provider Note (Signed)
Attestation of Attending Supervision of Advanced Practitioner (PA/CNM/NP): Evaluation and management procedures were performed by the Advanced Practitioner under my supervision and collaboration.  I have reviewed the Advanced Practitioner's note and chart, and I agree with the management and plan.  Shabazz Mckey, MD, FACOG Attending Obstetrician & Gynecologist Faculty Practice, [facility]  

## 2012-08-07 ENCOUNTER — Encounter (HOSPITAL_COMMUNITY): Payer: Self-pay | Admitting: *Deleted

## 2012-08-07 ENCOUNTER — Inpatient Hospital Stay (HOSPITAL_COMMUNITY)
Admission: AD | Admit: 2012-08-07 | Discharge: 2012-08-07 | Disposition: A | Discharge status: Home / Self Care | Payer: Medicaid Other | Source: Ambulatory Visit | Attending: Obstetrics and Gynecology | Admitting: Obstetrics and Gynecology

## 2012-08-07 ENCOUNTER — Inpatient Hospital Stay (HOSPITAL_COMMUNITY): Payer: Medicaid Other

## 2012-08-07 DIAGNOSIS — O209 Hemorrhage in early pregnancy, unspecified: Secondary | ICD-10-CM | POA: Insufficient documentation

## 2012-08-07 DIAGNOSIS — O468X9 Other antepartum hemorrhage, unspecified trimester: Secondary | ICD-10-CM

## 2012-08-07 DIAGNOSIS — O418X10 Other specified disorders of amniotic fluid and membranes, first trimester, not applicable or unspecified: Secondary | ICD-10-CM

## 2012-08-07 NOTE — MAU Provider Note (Signed)
History     CSN: [id number]  Arrival date and time: 08/07/12 0135   First Provider Initiated Contact with Patient 08/07/12 0217      Chief Complaint  Patient presents with  . Vaginal Bleeding   HPI [name] is a 22 y.o. G2P0010 at [redacted]w[redacted]d who presents to MAU today with complaint of vaginal bleeding that started just after 1am today. The patient describes the amount of bleeding as similar to a period that has lightened since onset. She had IUP on unofficial [place] in [facility] previously. She denies pain, vaginal discharge, fever, N/V/D or constipation or UTI symptoms. She had intercourse on Saturday night.   OB History   Grav Para Term Preterm Abortions TAB SAB Ect Mult Living   2    1  1          Past Medical History  Diagnosis Date  . No pertinent past medical history   . Medical history non-contributory     Past Surgical History  Procedure Laterality Date  . Eye surgery    . Tooth extraction      Family History  Problem Relation Age of Onset  . Hypertension Neg Hx   . Diabetes Neg Hx     History  Substance Use Topics  . Smoking status: Never Smoker   . Smokeless tobacco: Never Used  . Alcohol Use: No    Allergies: No Known Allergies  No prescriptions prior to admission    Review of Systems  Constitutional: Negative for fever and malaise/fatigue.  Gastrointestinal: Negative for nausea, vomiting, abdominal pain and diarrhea.  Genitourinary: Negative for dysuria, urgency and frequency.       + vaginal bleeding Neg - vaginal discharge  Neurological: Negative for dizziness and loss of consciousness.   Physical Exam   Blood pressure 135/72, pulse 89, temperature 98.5 F (36.9 C), temperature source Oral, resp. rate 18, height 5\' 4"  (1.626 m), weight 216 lb 2 oz (98.034 kg), last menstrual period 05/23/2012.  Physical Exam  Constitutional: She is oriented to person, place, and time. She appears well-developed and well-nourished. No distress.   HENT:  Head: Normocephalic and atraumatic.  Cardiovascular: Normal rate, regular rhythm and normal heart sounds.   Respiratory: Breath sounds normal. No respiratory distress.  GI: Soft. Bowel sounds are normal. She exhibits no distension and no mass. There is no tenderness. There is no rebound and no guarding.  Genitourinary: Uterus is enlarged (appropriate for GA). Uterus is not tender. Cervix exhibits no motion tenderness, no discharge and no friability. Right adnexum displays no mass and no tenderness. Left adnexum displays no mass and no tenderness. There is bleeding (small amount of dark blood noted) around the vagina. No vaginal discharge found.  Neurological: She is alert and oriented to person, place, and time.  Skin: Skin is warm and dry. No erythema.  Psychiatric: She has a normal mood and affect.   US Ob Comp Less 14 Wks  08/07/2012   *RADIOLOGY REPORT*  Clinical Data: Pregnant patient with vaginal bleeding.  OBSTETRIC <14 WK ULTRASOUND  Technique:  Transabdominal ultrasound was performed for evaluation of the gestation as well as the maternal uterus and adnexal regions.  Comparison:  None.  Intrauterine gestational sac: Visualized/normal in shape. Small subchorionic hemorrhage is identified. Yolk sac: Visualized. Embryo: Visualized. Cardiac Activity: Detected. Heart Rate: 158 bpm  CRL:  38 mm  10 w  5 d        [place] EDC: 02/28/2013  Maternal uterus/Adnexae: Unremarkable.  IMPRESSION: Single living intrauterine pregnancy.  Small subchorionic hemorrhage is identified.   Original Report Authenticated By: Holley Dexter, M.D.     MAU Course  Procedures None  MDM +FHTs More bleeding noted than expected for spotting after intercourse. Will get [place] today to attempt to confirm source of bleeding  Assessment and Plan  A: IUP at 10w 5d with cardiac activity Small subchorionic hemorrhage, first trimester  P: Discharge home Pelvic rest advised Bleeding precautions discussed Patient  advised to keep appointment as scheduled for routine prenatal care with [facility] Patient may return to MAU as needed or if her condition were to change or worsen  Freddi Starr, PA-C  08/07/2012, 3:44 AM

## 2012-08-07 NOTE — Discharge Instructions (Signed)
Pelvic Rest Pelvic rest is sometimes recommended for women when:   The placenta is partially or completely covering the opening of the cervix (placenta previa).  There is bleeding between the uterine wall and the amniotic sac in the first trimester (subchorionic hemorrhage).  The cervix begins to open without labor starting (incompetent cervix, cervical insufficiency).  The labor is too early (preterm labor). HOME CARE INSTRUCTIONS  Do not have sexual intercourse, stimulation, or an orgasm.  Do not use tampons, douche, or put anything in the vagina.  Do not lift anything over 10 pounds (4.5 kg).  Avoid strenuous activity or straining your pelvic muscles. SEEK MEDICAL CARE IF:  You have any vaginal bleeding during pregnancy. Treat this as a potential emergency.  You have cramping pain felt low in the stomach (stronger than menstrual cramps).  You notice vaginal discharge (watery, mucus, or bloody).  You have a low, dull backache.  There are regular contractions or uterine tightening. SEEK IMMEDIATE MEDICAL CARE IF: You have vaginal bleeding and have placenta previa.  Document Released: 04/16/2010 Document Revised: 03/14/2011 Document Reviewed: 04/16/2010 [facility] Patient Information 2014 [place], [place]. Subchorionic Hematoma A subchorionic hematoma is a gathering of blood between the outer wall of the placenta and the inner wall of the womb (uterus). The placenta is the organ which connects the fetus to the wall of the uterus. The placenta performs the feeding, breathing (oxygen to the fetus), and removes the waste (excretory work) of the fetus.  [facility] is the most common abnormality found on an ultrasound done during the first trimester or early second trimester of pregnancy. If there has been little or no vaginal bleeding, early small hematomas usually shrink on their own and do not affect the baby or pregnancy. The blood is gradually absorbed over about 1-2 weeks. When bleeding  starts later in pregnancy, is larger or occurs in an older patient, the outcome may not be as good. Larger hematomas may get bigger, which increases the chances for miscarriage. [facility] also increases the risk of premature detachment of the placenta from the uterus, preterm (premature) labor and stillbirth. CAUSES  There is no known cause for [facility]. However, it may occur at the time when the fertilized egg implants on the uterus and slightly separates from the uterus. SYMPTOMS  Most patients with a small subchorionic hematoma in first trimester have no problems. If problems do show up, they may include:  Premature labor.  Painless vaginal bleeding.  Belly (abdominal) or pelvic pain/cramping. DIAGNOSIS  An ultrasound will likely be performed. HOME CARE INSTRUCTIONS   Although bed rest will not prevent more bleeding or prevent a miscarriage, your caregiver may recommend bed rest until you are advised otherwise.  Your caregiver may ask you to avoid heavy lifting (more than 10 pounds), exercise, sexual intercourse or douching.  Keep track of the number of pads you use each day and how soaked (saturated) they are. Write down this information.  Do not use tampons.  Your caregiver may ask you to have follow-up blood tests and/or ultrasound studies.  Your caregiver may recommend Rho gam vaccination if you are Rh negative and the father is Rh positive. SEEK IMMEDIATE MEDICAL CARE IF:   You experience severe cramps in your stomach, back, abdomen or pelvis.  An unexplained oral temperature above 100 F (37.8 C) develops. Write these down.  You pass large clots or tissue. Save any tissue for your caregiver to look at.  Your bleeding increases or you become light-headed, weak or  have fainting episodes. Document Released: 04/06/2006 Document Revised: 03/14/2011 Document Reviewed: 04/11/2008 [facility] Patient Information 2014 [place], [place].

## 2012-08-07 NOTE — MAU Note (Signed)
PT SAYS ALL FINE UNTIL TONIGHT  AT  0057- SHE WAS LAYING IN BED- SHE STARTED GUSHING BLOOD. WITH NO CRAMPS. PAD ON IN TRIAGE-  SMALL AMT RED/ BROWN   BLOOD.Marland Kitchen   GOING TO CLINIC  FOR PNC-  HAS AN APPOINTMENT ON 8-18.  HAD AN U/S IN CLINIC 2 WEEKS AGO 7-21-  ALL FINE.   LAST SEX-  Saturday.

## 2012-08-08 NOTE — MAU Provider Note (Signed)
Attestation of Attending Supervision of Advanced Practitioner (CNM/NP): Evaluation and management procedures were performed by the Advanced Practitioner under my supervision and collaboration.  I have reviewed the Advanced Practitioner's note and chart, and I agree with the management and plan.  Avianah Pellman 08/08/2012 10:58 AM   

## 2012-08-20 ENCOUNTER — Ambulatory Visit (HOSPITAL_COMMUNITY)
Admission: RE | Admit: 2012-08-20 | Discharge: 2012-08-20 | Disposition: A | Discharge status: Home / Self Care | Payer: Medicaid Other | Source: Ambulatory Visit | Attending: Obstetrics and Gynecology | Admitting: Obstetrics and Gynecology

## 2012-08-20 ENCOUNTER — Other Ambulatory Visit: Payer: Self-pay

## 2012-08-20 ENCOUNTER — Ambulatory Visit (HOSPITAL_COMMUNITY)
Admission: RE | Admit: 2012-08-20 | Discharge: 2012-08-20 | Disposition: A | Discharge status: Home / Self Care | Payer: Medicaid Other | Source: Ambulatory Visit | Attending: Family Medicine | Admitting: Family Medicine

## 2012-08-20 DIAGNOSIS — O3510X0 Maternal care for (suspected) chromosomal abnormality in fetus, unspecified, not applicable or unspecified: Secondary | ICD-10-CM | POA: Insufficient documentation

## 2012-08-20 DIAGNOSIS — Z3481 Encounter for supervision of other normal pregnancy, first trimester: Secondary | ICD-10-CM

## 2012-08-20 DIAGNOSIS — Z3689 Encounter for other specified antenatal screening: Secondary | ICD-10-CM | POA: Insufficient documentation

## 2012-08-20 DIAGNOSIS — O351XX0 Maternal care for (suspected) chromosomal abnormality in fetus, not applicable or unspecified: Secondary | ICD-10-CM | POA: Insufficient documentation

## 2012-08-21 ENCOUNTER — Ambulatory Visit (INDEPENDENT_AMBULATORY_CARE_PROVIDER_SITE_OTHER): Payer: Medicaid Other | Admitting: Family Medicine

## 2012-08-21 VITALS — BP 133/79 | Temp 96.8°F | Wt 216.7 lb

## 2012-08-21 DIAGNOSIS — Z3401 Encounter for supervision of normal first pregnancy, first trimester: Secondary | ICD-10-CM

## 2012-08-21 DIAGNOSIS — Z3482 Encounter for supervision of other normal pregnancy, second trimester: Secondary | ICD-10-CM

## 2012-08-21 DIAGNOSIS — Z3480 Encounter for supervision of other normal pregnancy, unspecified trimester: Secondary | ICD-10-CM

## 2012-08-21 LAB — POCT URINALYSIS DIP (DEVICE)
Bilirubin Urine: NEGATIVE
Leukocytes, UA: NEGATIVE
Nitrite: NEGATIVE
Protein, ur: NEGATIVE mg/dL
pH: 5.5 (ref 5.0–8.0)

## 2012-08-21 LAB — OB RESULTS CONSOLE HIV ANTIBODY (ROUTINE TESTING): HIV: NONREACTIVE

## 2012-08-21 NOTE — Progress Notes (Signed)
Pulse: 89

## 2012-08-21 NOTE — Progress Notes (Signed)
   Subjective:    Diana Jacobs is a 22 y.o. female being seen today for her obstetrical visit. She is at [redacted]w[redacted]d gestation. Patient reports no bleeding, no contractions and no cramping. Fetal movement: NA. Seen in MAU for vaginal bleeding. Resolved. No recurrence.  Menstrual History: OB History   Grav Para Term Preterm Abortions TAB SAB Ect Mult Living   2    1  1           Patient's last menstrual period was 05/23/2012.    The following portions of the patient's history were reviewed and updated as appropriate: allergies, current medications, past family history, past medical history, past social history, past surgical history and problem list.  Review of Systems Pertinent items are noted in HPI.   Objective:     BP 133/79  Temp(Src) 96.8 F (36 C)  Wt 98.294 kg (216 lb 11.2 oz)  BMI 37.18 kg/m2  LMP 05/23/2012 Uterine Size: size equals dates  Pelvic Exam:   FHT: 151  Assessment:  Diana Jacobs is a 22 y.o. G2P0010 at [redacted]w[redacted]d by here for ROB visit.  Normal NT test,   Discussed with Patient:  - New OB labs from last visit were wnl. - RTC for any VB, regular, painful cramps/ctxs occurring at a rate of >2/10 min, fever (100.5 or higher), n/v/d, any pain that is unresolving or worsening. - Routine precautions(SAB, depression, infection s/s) - RTC in 4 weeks for next appt.  To Do: 1.   [ ]  Vaccines: Flu:  Tdap:  [ ]  BCM:   Edu: [x]  PTL precautions; [ ]  BF class; [ ]  childbirth class; [ ]   BF counseling;

## 2012-08-21 NOTE — Patient Instructions (Signed)
Pregnancy - Second Trimester The second trimester of pregnancy (3 to 6 months) is a period of rapid growth for you and your baby. At the end of the sixth month, your baby is about 9 inches long and weighs 1 1/2 pounds. You will begin to feel the baby move between 18 and 20 weeks of the pregnancy. This is called quickening. Weight gain is faster. A clear fluid (colostrum) may leak out of your breasts. You may feel small contractions of the womb (uterus). This is known as false labor or Braxton-Hicks contractions. This is like a practice for labor when the baby is ready to be born. Usually, the problems with morning sickness have usually passed by the end of your first trimester. Some women develop small dark blotches (called cholasma, mask of pregnancy) on their face that usually goes away after the baby is born. Exposure to the sun makes the blotches worse. Acne may also develop in some pregnant women and pregnant women who have acne, may find that it goes away. PRENATAL EXAMS  Blood work may continue to be done during prenatal exams. These tests are done to check on your health and the probable health of your baby. Blood work is used to follow your blood levels (hemoglobin). Anemia (low hemoglobin) is common during pregnancy. Iron and vitamins are given to help prevent this. You will also be checked for diabetes between 24 and 28 weeks of the pregnancy. Some of the previous blood tests may be repeated.  The size of the uterus is measured during each visit. This is to make sure that the baby is continuing to grow properly according to the dates of the pregnancy.  Your blood pressure is checked every prenatal visit. This is to make sure you are not getting toxemia.  Your urine is checked to make sure you do not have an infection, diabetes or protein in the urine.  Your weight is checked often to make sure gains are happening at the suggested rate. This is to ensure that both you and your baby are  growing normally.  Sometimes, an ultrasound is performed to confirm the proper growth and development of the baby. This is a test which bounces harmless sound waves off the baby so your caregiver can more accurately determine due dates. Sometimes, a test is done on the amniotic fluid surrounding the baby. This test is called an amniocentesis. The amniotic fluid is obtained by sticking a needle into the belly (abdomen). This is done to check the chromosomes in instances where there is a concern about possible genetic problems with the baby. It is also sometimes done near the end of pregnancy if an early delivery is required. In this case, it is done to help make sure the baby's lungs are mature enough for the baby to live outside of the womb. CHANGES OCCURING IN THE SECOND TRIMESTER OF PREGNANCY Your body goes through many changes during pregnancy. They vary from person to person. Talk to your caregiver about changes you notice that you are concerned about.  During the second trimester, you will likely have an increase in your appetite. It is normal to have cravings for certain foods. This varies from person to person and pregnancy to pregnancy.  Your lower abdomen will begin to bulge.  You may have to urinate more often because the uterus and baby are pressing on your bladder. It is also common to get more bladder infections during pregnancy. You can help this by drinking lots of fluids   and emptying your bladder before and after intercourse.  You may begin to get stretch marks on your hips, abdomen, and breasts. These are normal changes in the body during pregnancy. There are no exercises or medicines to take that prevent this change.  You may begin to develop swollen and bulging veins (varicose veins) in your legs. Wearing support hose, elevating your feet for 15 minutes, 3 to 4 times a day and limiting salt in your diet helps lessen the problem.  Heartburn may develop as the uterus grows and  pushes up against the stomach. Antacids recommended by your caregiver helps with this problem. Also, eating smaller meals 4 to 5 times a day helps.  Constipation can be treated with a stool softener or adding bulk to your diet. Drinking lots of fluids, and eating vegetables, fruits, and whole grains are helpful.  Exercising is also helpful. If you have been very active up until your pregnancy, most of these activities can be continued during your pregnancy. If you have been less active, it is helpful to start an exercise program such as walking.  Hemorrhoids may develop at the end of the second trimester. Warm sitz baths and hemorrhoid cream recommended by your caregiver helps hemorrhoid problems.  Backaches may develop during this time of your pregnancy. Avoid heavy lifting, wear low heal shoes, and practice good posture to help with backache problems.  Some pregnant women develop tingling and numbness of their hand and fingers because of swelling and tightening of ligaments in the wrist (carpel tunnel syndrome). This goes away after the baby is born.  As your breasts enlarge, you may have to get a bigger bra. Get a comfortable, cotton, support bra. Do not get a nursing bra until the last month of the pregnancy if you will be nursing the baby.  You may get a dark line from your belly button to the pubic area called the linea nigra.  You may develop rosy cheeks because of increase blood flow to the face.  You may develop spider looking lines of the face, neck, arms, and chest. These go away after the baby is born. HOME CARE INSTRUCTIONS   It is extremely important to avoid all smoking, herbs, alcohol, and unprescribed drugs during your pregnancy. These chemicals affect the formation and growth of the baby. Avoid these chemicals throughout the pregnancy to ensure the delivery of a healthy infant.  Most of your home care instructions are the same as suggested for the first trimester of your  pregnancy. Keep your caregiver's appointments. Follow your caregiver's instructions regarding medicine use, exercise, and diet.  During pregnancy, you are providing food for you and your baby. Continue to eat regular, well-balanced meals. Choose foods such as meat, fish, milk and other low fat dairy products, vegetables, fruits, and whole-grain breads and cereals. Your caregiver will tell you of the ideal weight gain.  A physical sexual relationship may be continued up until near the end of pregnancy if there are no other problems. Problems could include early (premature) leaking of amniotic fluid from the membranes, vaginal bleeding, abdominal pain, or other medical or pregnancy problems.  Exercise regularly if there are no restrictions. Check with your caregiver if you are unsure of the safety of some of your exercises. The greatest weight gain will occur in the last 2 trimesters of pregnancy. Exercise will help you:  Control your weight.  Get you in shape for labor and delivery.  Lose weight after you have the baby.  Wear   a good support or jogging bra for breast tenderness during pregnancy. This may help if worn during sleep. Pads or tissues may be used in the bra if you are leaking colostrum.  Do not use hot tubs, steam rooms or saunas throughout the pregnancy.  Wear your seat belt at all times when driving. This protects you and your baby if you are in an accident.  Avoid raw meat, uncooked cheese, cat litter boxes, and soil used by cats. These carry germs that can cause birth defects in the baby.  The second trimester is also a good time to visit your dentist for your dental health if this has not been done yet. Getting your teeth cleaned is okay. Use a soft toothbrush. Brush gently during pregnancy.  It is easier to leak urine during pregnancy. Tightening up and strengthening the pelvic muscles will help with this problem. Practice stopping your urination while you are going to the  bathroom. These are the same muscles you need to strengthen. It is also the muscles you would use as if you were trying to stop from passing gas. You can practice tightening these muscles up 10 times a set and repeating this about 3 times per day. Once you know what muscles to tighten up, do not perform these exercises during urination. It is more likely to contribute to an infection by backing up the urine.  Ask for help if you have financial, counseling, or nutritional needs during pregnancy. Your caregiver will be able to offer counseling for these needs as well as refer you for other special needs.  Your skin may become oily. If so, wash your face with mild soap, use non-greasy moisturizer and oil or cream based makeup. MEDICINES AND DRUG USE IN PREGNANCY  Take prenatal vitamins as directed. The vitamin should contain 1 milligram of folic acid. Keep all vitamins out of reach of children. Only a couple vitamins or tablets containing iron may be fatal to a baby or young child when ingested.  Avoid use of all medicines, including herbs, over-the-counter medicines, not prescribed or suggested by your caregiver. Only take over-the-counter or prescription medicines for pain, discomfort, or fever as directed by your caregiver. Do not use aspirin.  Let your caregiver also know about herbs you may be using.  Alcohol is related to a number of birth defects. This includes fetal alcohol syndrome. All alcohol, in any form, should be avoided completely. Smoking will cause low birth rate and premature babies.  Street or illegal drugs are very harmful to the baby. They are absolutely forbidden. A baby born to an addicted mother will be addicted at birth. The baby will go through the same withdrawal an adult does. SEEK MEDICAL CARE IF:  You have any concerns or worries during your pregnancy. It is better to call with your questions if you feel they cannot wait, rather than worry about them. SEEK IMMEDIATE  MEDICAL CARE IF:   An unexplained oral temperature above 102 F (38.9 C) develops, or as your caregiver suggests.  You have leaking of fluid from the vagina (birth canal). If leaking membranes are suspected, take your temperature and tell your caregiver of this when you call.  There is vaginal spotting, bleeding, or passing clots. Tell your caregiver of the amount and how many pads are used. Light spotting in pregnancy is common, especially following intercourse.  You develop a bad smelling vaginal discharge with a change in the color from clear to white.  You continue to feel   sick to your stomach (nauseated) and have no relief from remedies suggested. You vomit blood or coffee ground-like materials.  You lose more than 2 pounds of weight or gain more than 2 pounds of weight over 1 week, or as suggested by your caregiver.  You notice swelling of your face, hands, feet, or legs.  You get exposed to German measles and have never had them.  You are exposed to fifth disease or chickenpox.  You develop belly (abdominal) pain. Round ligament discomfort is a common non-cancerous (benign) cause of abdominal pain in pregnancy. Your caregiver still must evaluate you.  You develop a bad headache that does not go away.  You develop fever, diarrhea, pain with urination, or shortness of breath.  You develop visual problems, blurry, or double vision.  You fall or are in a car accident or any kind of trauma.  There is mental or physical violence at home. Document Released: 12/14/2000 Document Revised: 09/14/2011 Document Reviewed: 06/18/2008 [employer] Patient Information 2014 [employer].  

## 2012-08-31 ENCOUNTER — Encounter: Payer: Self-pay | Admitting: *Deleted

## 2012-08-31 DIAGNOSIS — Z3401 Encounter for supervision of normal first pregnancy, first trimester: Secondary | ICD-10-CM

## 2012-09-10 ENCOUNTER — Encounter (HOSPITAL_COMMUNITY): Payer: Self-pay | Admitting: *Deleted

## 2012-09-10 ENCOUNTER — Inpatient Hospital Stay (HOSPITAL_COMMUNITY): Payer: Medicaid Other

## 2012-09-10 ENCOUNTER — Inpatient Hospital Stay (HOSPITAL_COMMUNITY)
Admission: AD | Admit: 2012-09-10 | Discharge: 2012-09-10 | Disposition: A | Discharge status: Home / Self Care | Payer: Medicaid Other | Source: Ambulatory Visit | Attending: Obstetrics & Gynecology | Admitting: Obstetrics & Gynecology

## 2012-09-10 DIAGNOSIS — B373 Candidiasis of vulva and vagina: Secondary | ICD-10-CM | POA: Insufficient documentation

## 2012-09-10 DIAGNOSIS — O2390 Unspecified genitourinary tract infection in pregnancy, unspecified trimester: Secondary | ICD-10-CM | POA: Insufficient documentation

## 2012-09-10 DIAGNOSIS — N949 Unspecified condition associated with female genital organs and menstrual cycle: Secondary | ICD-10-CM | POA: Insufficient documentation

## 2012-09-10 DIAGNOSIS — B3731 Acute candidiasis of vulva and vagina: Secondary | ICD-10-CM | POA: Insufficient documentation

## 2012-09-10 DIAGNOSIS — R109 Unspecified abdominal pain: Secondary | ICD-10-CM | POA: Insufficient documentation

## 2012-09-10 LAB — URINALYSIS, ROUTINE W REFLEX MICROSCOPIC
Bilirubin Urine: NEGATIVE
Glucose, UA: NEGATIVE mg/dL
Hgb urine dipstick: NEGATIVE
Protein, ur: NEGATIVE mg/dL
Urobilinogen, UA: 0.2 mg/dL (ref 0.0–1.0)

## 2012-09-10 LAB — URINE MICROSCOPIC-ADD ON

## 2012-09-10 LAB — WET PREP, GENITAL
Clue Cells Wet Prep HPF POC: NONE SEEN
Trich, Wet Prep: NONE SEEN

## 2012-09-10 MED ORDER — FLUCONAZOLE 150 MG PO TABS: 150.0000 mg | ORAL_TABLET | Freq: Once | ORAL | Status: DC

## 2012-09-10 MED ORDER — FLUCONAZOLE 150 MG PO TABS
150.0000 mg | ORAL_TABLET | Freq: Once | ORAL | Status: AC
  Administered 2012-09-10: 150 mg via ORAL
  Filled 2012-09-10: qty 1

## 2012-09-10 NOTE — MAU Note (Signed)
asking if she can get an [place]

## 2012-09-10 NOTE — MAU Note (Signed)
Thinks has UTI (burns when she pees), and having pain in rt side, started last Thurs.

## 2012-09-10 NOTE — MAU Note (Signed)
Pt says she has had right sided, lateral pain since last Thursday that has worsened.  She mostly c/o frequency and burning upon urination for the past four days as welll.

## 2012-09-10 NOTE — MAU Provider Note (Signed)
History     CSN: [id number]  Arrival date and time: 09/10/12 1420   First Provider Initiated Contact with Patient 09/10/12 1523      Chief Complaint  Patient presents with  . Abdominal Pain   HPI   [name] is a 22 y.o. female G2P0010 at [redacted]w[redacted]d who presents with complaints of abdominal pain and vaginal pain. Symptoms started last Thursday; the pain comes and goes. Pt has not started feeling the baby move; she denies vaginal bleeding or abnormal vaginal discharge.  Plans to see Diana Jacobs office for prenatal care. First appointment is next week. She started prenatal care in the clinic, however decided to change Diana Jacobs is very interested in having an US done today.   OB History   Grav Para Term Preterm Abortions TAB SAB Ect Mult Living   2    1  1          Past Medical History  Diagnosis Date  . No pertinent past medical history   . Medical history non-contributory     Past Surgical History  Procedure Laterality Date  . Eye surgery    . Tooth extraction      Family History  Problem Relation Age of Onset  . Hypertension Neg Hx   . Diabetes Neg Hx     History  Substance Use Topics  . Smoking status: Never Smoker   . Smokeless tobacco: Never Used  . Alcohol Use: No    Allergies: No Known Allergies  Prescriptions prior to admission  Medication Sig Dispense Refill  . Prenat w/o A Vit-FeFum-FePo-FA (CONCEPT OB) 130-92.4-1 MG CAPS Take 1 tablet by mouth 1 day or 1 dose.  30 capsule  5   Results for orders placed during the hospital encounter of 09/10/12 (from the past 24 hour(s))  URINALYSIS, ROUTINE W REFLEX MICROSCOPIC     Status: Abnormal   Collection Time    09/10/12  3:00 PM      Result Value Range   Color, Urine YELLOW  YELLOW   APPearance CLEAR  CLEAR   Specific Gravity, Urine >1.030 (*) 1.005 - 1.030   pH 6.0  5.0 - 8.0   Glucose, UA NEGATIVE  NEGATIVE mg/dL   Hgb urine dipstick NEGATIVE  NEGATIVE   Bilirubin Urine NEGATIVE  NEGATIVE   Ketones, ur NEGATIVE  NEGATIVE mg/dL   Protein, ur NEGATIVE  NEGATIVE mg/dL   Urobilinogen, UA 0.2  0.0 - 1.0 mg/dL   Nitrite NEGATIVE  NEGATIVE   Leukocytes, UA TRACE (*) NEGATIVE  URINE MICROSCOPIC-ADD ON     Status: Abnormal   Collection Time    09/10/12  3:00 PM      Result Value Range   Squamous Epithelial / LPF FEW (*) RARE   WBC, UA 7-10  <3 WBC/hpf   RBC / HPF 3-6  <3 RBC/hpf   Bacteria, UA MANY (*) RARE   Urine-Other FEW YEAST    WET PREP, GENITAL     Status: Abnormal   Collection Time    09/10/12  3:50 PM      Result Value Range   Yeast Wet Prep HPF POC FEW (*) NONE SEEN   Trich, Wet Prep NONE SEEN  NONE SEEN   Clue Cells Wet Prep HPF POC NONE SEEN  NONE SEEN   WBC, Wet Prep HPF POC FEW (*) NONE SEEN     Review of Systems  Constitutional: Negative for fever and chills.  Gastrointestinal: Positive for abdominal pain. Negative  for heartburn, nausea, vomiting, diarrhea and constipation.       Right mid to upper abdominal pain  Genitourinary: Positive for dysuria, urgency and frequency. Negative for hematuria and flank pain.  Neurological: Negative for dizziness and headaches.   Physical Exam   Blood pressure 121/74, pulse 83, temperature 98.4 F (36.9 C), temperature source Oral, resp. rate 20, height 5\' 3"  (1.6 m), weight 97.977 kg (216 lb), last menstrual period 05/23/2012. Fetal heart tones by fetal doppler: 147 bpm    Physical Exam  Constitutional: She appears well-developed and well-nourished. No distress.  GI: She exhibits no distension. There is no tenderness. There is no rebound.  Genitourinary: There is tenderness on the right labia. There is tenderness on the left labia. Uterus is enlarged. Cervix exhibits discharge. Cervix exhibits no motion tenderness and no friability. Right adnexum displays no tenderness. Left adnexum displays no tenderness. Vaginal discharge found.  Erythema surrounding perineum  Speculum exam: Vagina - moderate amount of white  discharge, no odor; Thick Curd like discharge in vaginal canal Cervix - No contact bleeding Bimanual exam: Cervix closed; middle, fetal presentation palpated low in the pelvis  Uterus non tender, normal size for gestational age Adnexa non tender, no masses bilaterally GC/Chlam, wet prep done Chaperone present for exam.   Skin: Skin is warm. She is not diaphoretic.    MAU Course  Procedures  MDM +fht  UA Wet prep  GC/Chlamydia   [place] for cervical length  Diflucan 150 mg PO in MAU   [place] report shows: Cervical length 4.43 cm. Cervix: normal appearance by transvaginal scan  Assessment and Plan  A: Vulvovaginal Candidiasis   Normal cervical length by [place]    P: Discharge home RX: Diflucan 150 mg PO; take second dose in 3 days (#1) no rf Return to MAU with worsening symptoms  Keep your scheduled appointment with Diana Jacobs, Diana Jacobs IRENE FNP-C 09/10/2012, 8:02 PM

## 2012-09-11 LAB — GC/CHLAMYDIA PROBE AMP
CT Probe RNA: NEGATIVE
GC Probe RNA: NEGATIVE

## 2012-09-11 LAB — URINE CULTURE

## 2012-09-18 ENCOUNTER — Encounter: Payer: Medicaid Other | Admitting: Obstetrics & Gynecology

## 2012-09-25 ENCOUNTER — Inpatient Hospital Stay (HOSPITAL_COMMUNITY): Admission: AD | Admit: 2012-09-25 | Payer: Medicaid Other | Source: Ambulatory Visit | Admitting: Obstetrics

## 2012-10-12 ENCOUNTER — Ambulatory Visit (HOSPITAL_COMMUNITY): Payer: Medicaid Other

## 2012-11-15 ENCOUNTER — Encounter (HOSPITAL_COMMUNITY): Payer: Self-pay | Admitting: Emergency Medicine

## 2012-11-15 ENCOUNTER — Emergency Department (INDEPENDENT_AMBULATORY_CARE_PROVIDER_SITE_OTHER)
Admission: EM | Admit: 2012-11-15 | Discharge: 2012-11-15 | Disposition: A | Discharge status: Home / Self Care | Payer: Medicaid Other | Source: Home / Self Care

## 2012-11-15 DIAGNOSIS — M778 Other enthesopathies, not elsewhere classified: Secondary | ICD-10-CM

## 2012-11-15 DIAGNOSIS — M65839 Other synovitis and tenosynovitis, unspecified forearm: Secondary | ICD-10-CM

## 2012-11-15 NOTE — ED Provider Notes (Signed)
CSN: [id number]     Arrival date & time 11/15/12  1422 History   First MD Initiated Contact with Patient 11/15/12 1442     Chief Complaint  Patient presents with  . Arm Pain   (Consider location/radiation/quality/duration/timing/severity/associated sxs/prior Treatment) HPI Comments: 22 year old moderately obese female complaining of left wrist and forearm pain since last p.m. She states it woke her up during the night. Pain is located from mid forearm to the wrist. She is unaware of any particular injury that may have caused the pain. She uses her phone for various reasons to include games and texting but states she never uses her left hand. She is unemployed and has no job for repetitive use. She states she does clean her house periodically and uses both hands.  Patient is a 22 y.o. female presenting with arm pain.  Arm Pain    Past Medical History  Diagnosis Date  . No pertinent past medical history   . Medical history non-contributory    Past Surgical History  Procedure Laterality Date  . Eye surgery    . Tooth extraction     Family History  Problem Relation Age of Onset  . Hypertension Neg Hx   . Diabetes Neg Hx    History  Substance Use Topics  . Smoking status: Never Smoker   . Smokeless tobacco: Never Used  . Alcohol Use: No   OB History   Grav Para Term Preterm Abortions TAB SAB Ect Mult Living   2    1  1         Review of Systems  Constitutional: Negative for fever, chills and activity change.  HENT: Negative.   Respiratory: Negative.   Cardiovascular: Negative.   Musculoskeletal: Positive for arthralgias.       As per HPI  Skin: Negative.  Negative for color change, pallor and rash.  Neurological: Negative.     Allergies  Review of patient's allergies indicates no known allergies.  Home Medications   Current Outpatient Rx  Name  Route  Sig  Dispense  Refill  . Prenat w/o A Vit-FeFum-FePo-FA (CONCEPT OB) 130-92.4-1 MG CAPS   Oral   Take 1 tablet  by mouth 1 day or 1 dose.   30 capsule   5   . fluconazole (DIFLUCAN) 150 MG tablet   Oral   Take 1 tablet (150 mg total) by mouth once.   1 tablet   0    BP 127/75  Pulse 97  Temp(Src) 98 F (36.7 C) (Oral)  Resp 16  SpO2 100%  LMP 05/23/2012 Physical Exam  Nursing note and vitals reviewed. Constitutional: She is oriented to person, place, and time. She appears well-developed and well-nourished. No distress.  HENT:  Head: Normocephalic and atraumatic.  Eyes: EOM are normal.  Neck: Normal range of motion. Neck supple.  Pulmonary/Chest: Effort normal. No respiratory distress.  Musculoskeletal:  The lightest touch to her distal forearm and wrist causes her to withdrawal  And wence. No swelling, deformity or discoloration. Pulse 2+. Able to make a fist, extend and flex the wrist but with forearm and wrist pain. Pronation and supination intact. Distal N/V, M/S intact.  Neurological: She is alert and oriented to person, place, and time. No cranial nerve deficit.  Skin: Skin is warm and dry.  Psychiatric: She has a normal mood and affect.  Content of speech and behavior suggests she is undereducated with a poor fund of knowledge.    ED Course  Procedures (including critical care  time) Labs Review Labs Reviewed - No data to display Imaging Review No results found.     MDM   1. Tendinitis of left wrist      Wear wrist splint for 4-5 days, longer if pain persists. Limit use of L wrist and hand. Ice off and on Tylenol as needed Follow with your [facility] doctor.  Hayden Rasmussen, NP 11/15/12 1530  Hayden Rasmussen, NP 11/15/12 1537

## 2012-11-15 NOTE — ED Notes (Signed)
Pt c/o left arm/wrist pain onset this morning Denies: inj/trauma, strenuous activity Reports she was awoken by the pain; does not know if she inj arm while asleep Pain increases w/activity Alert w/no signs of acute distress.

## 2012-11-17 NOTE — ED Provider Notes (Signed)
Medical screening examination/treatment/procedure(s) were performed by a resident physician or non-physician practitioner and as the supervising physician I was immediately available for consultation/collaboration.  Clementeen Graham, MD    Rodolph Bong, MD 11/17/12 [phone]

## 2012-11-21 ENCOUNTER — Encounter: Payer: Self-pay | Admitting: *Deleted

## 2012-12-12 ENCOUNTER — Inpatient Hospital Stay (HOSPITAL_COMMUNITY)
Admission: AD | Admit: 2012-12-12 | Discharge: 2012-12-12 | Disposition: A | Discharge status: Home / Self Care | Payer: Medicaid Other | Source: Ambulatory Visit | Attending: Obstetrics | Admitting: Obstetrics

## 2012-12-12 ENCOUNTER — Encounter (HOSPITAL_COMMUNITY): Payer: Self-pay | Admitting: *Deleted

## 2012-12-12 DIAGNOSIS — O26899 Other specified pregnancy related conditions, unspecified trimester: Secondary | ICD-10-CM

## 2012-12-12 DIAGNOSIS — R109 Unspecified abdominal pain: Secondary | ICD-10-CM | POA: Insufficient documentation

## 2012-12-12 DIAGNOSIS — O99891 Other specified diseases and conditions complicating pregnancy: Secondary | ICD-10-CM | POA: Insufficient documentation

## 2012-12-12 DIAGNOSIS — O9989 Other specified diseases and conditions complicating pregnancy, childbirth and the puerperium: Secondary | ICD-10-CM

## 2012-12-12 LAB — WET PREP, GENITAL
Clue Cells Wet Prep HPF POC: NONE SEEN
Trich, Wet Prep: NONE SEEN
Yeast Wet Prep HPF POC: NONE SEEN

## 2012-12-12 LAB — URINE MICROSCOPIC-ADD ON

## 2012-12-12 LAB — URINALYSIS, ROUTINE W REFLEX MICROSCOPIC
Glucose, UA: NEGATIVE mg/dL
Nitrite: NEGATIVE
Specific Gravity, Urine: 1.025 (ref 1.005–1.030)
pH: 5.5 (ref 5.0–8.0)

## 2012-12-12 MED ORDER — ACETAMINOPHEN 325 MG PO TABS
650.0000 mg | ORAL_TABLET | Freq: Once | ORAL | Status: AC
  Administered 2012-12-12: 650 mg via ORAL
  Filled 2012-12-12: qty 2

## 2012-12-12 NOTE — MAU Note (Signed)
abd pain and pelvic pressure, started at 1200. Denies hx of PTL

## 2012-12-12 NOTE — MAU Provider Note (Signed)
History     CSN: [id number]  Arrival date and time: 12/12/12 1242   First Provider Initiated Contact with Patient 12/12/12 1358      Chief Complaint  Patient presents with  . Abdominal Pain   HPI  Diana Jacobs is 22 y.o. G2P0010 [redacted]w[redacted]d weeks presenting with sharp abdominal pain--suprapubic area-X 2 hrs.  Rating her pain 10/10.  She is a patient of Dr. Gaynell Face.  She reports uncomplicated pregnancy.  Denies trauma.  Began while she was sitting.  Denies vaginal bleeding, leaking of fluid, or vaginal discharge.  She has not tried anything for the pain.  + FETAL MOVEMENT>    Past Medical History  Diagnosis Date  . No pertinent past medical history   . Medical history non-contributory     Past Surgical History  Procedure Laterality Date  . Eye surgery    . Tooth extraction      Family History  Problem Relation Age of Onset  . Hypertension Neg Hx   . Diabetes Neg Hx     History  Substance Use Topics  . Smoking status: Never Smoker   . Smokeless tobacco: Never Used  . Alcohol Use: No    Allergies: No Known Allergies  Prescriptions prior to admission  Medication Sig Dispense Refill  . Prenatal Vit-Fe Fumarate-FA (PRENATAL MULTIVITAMIN) TABS tablet Take 1 tablet by mouth daily at 12 noon.        Review of Systems  Constitutional: Negative for fever and chills.  Gastrointestinal: Positive for abdominal pain (suprapubic and bilateral lower pain ). Negative for nausea and vomiting.  Genitourinary: Negative for dysuria, urgency, frequency and hematuria.       Neg for vaginal bleeding, vaginal discharge   Physical Exam   Blood pressure 118/70, pulse 104, temperature 98.7 F (37.1 C), temperature source Oral, resp. rate 18, height 5' 3.5" (1.613 m), weight 221 lb (100.245 kg), last menstrual period 05/23/2012.  Physical Exam  Constitutional: She is oriented to person, place, and time. She appears well-developed and well-nourished. No distress.  HENT:  Head:  Normocephalic.  Neck: Normal range of motion.  Cardiovascular: Normal rate.   Respiratory: Effort normal.  GI: Soft. She exhibits no distension and no mass. There is no tenderness (mild suprapubic tenderness and bilateral pain- very low over the round ligaments). There is no rebound and no guarding.  Genitourinary: There is no rash, tenderness or lesion on the right labia. There is no rash, tenderness or lesion on the left labia. Uterus is enlarged. Uterus is not tender. Cervix exhibits no motion tenderness, no discharge and no friability. No erythema or bleeding around the vagina. Vaginal discharge (small amount of white discharge without odor) found.  Cervix is closed. posterior  Neurological: She is alert and oriented to person, place, and time.  Skin: Skin is warm and dry.  Psychiatric: She has a normal mood and affect. Her behavior is normal.   Results for orders placed during the hospital encounter of 12/12/12 (from the past 24 hour(s))  URINALYSIS, ROUTINE W REFLEX MICROSCOPIC     Status: Abnormal   Collection Time    12/12/12  1:30 PM      Result Value Range   Color, Urine AMBER (*) YELLOW   APPearance CLEAR  CLEAR   Specific Gravity, Urine 1.025  1.005 - 1.030   pH 5.5  5.0 - 8.0   Glucose, UA NEGATIVE  NEGATIVE mg/dL   Hgb urine dipstick NEGATIVE  NEGATIVE   Bilirubin Urine NEGATIVE  NEGATIVE   Ketones, ur 15 (*) NEGATIVE mg/dL   Protein, ur NEGATIVE  NEGATIVE mg/dL   Urobilinogen, UA 1.0  0.0 - 1.0 mg/dL   Nitrite NEGATIVE  NEGATIVE   Leukocytes, UA TRACE (*) NEGATIVE  URINE MICROSCOPIC-ADD ON     Status: Abnormal   Collection Time    12/12/12  1:30 PM      Result Value Range   Squamous Epithelial / LPF MANY (*) RARE   WBC, UA 3-6  <3 WBC/hpf   RBC / HPF 0-2  <3 RBC/hpf   Bacteria, UA MANY (*) RARE  WET PREP, GENITAL     Status: Abnormal   Collection Time    12/12/12  2:30 PM      Result Value Range   Yeast Wet Prep HPF POC NONE SEEN  NONE SEEN   Trich, Wet Prep  NONE SEEN  NONE SEEN   Clue Cells Wet Prep HPF POC NONE SEEN  NONE SEEN   WBC, Wet Prep HPF POC FEW (*) NONE SEEN   MAU Course  Procedures   FMS  Baseline 135, moderate variability, reactive.  Contractions not seen                      Urine culture to lab  MDM Tyelnol 650mg  po given in MAU. 15:35  Patient is feeling better after tylenol 650mg , po hydration and crackers--- rating her pain 3/10  Assessment and Plan  A:  Abdominal pain in third trimester      Cervix is closed      Round ligament pain     P:  Stressed importance of staying well hydrated to decrease muscle pain      May take Tylenol prn at home    Follow up with Dr. Caryl Pina if sxs worsen.    Patient instructed to call office Friday am to get urine culture results  KEY,EVE M 12/12/2012, 3:41 PM

## 2012-12-13 LAB — URINE CULTURE

## 2013-01-03 NOTE — L&D Delivery Note (Signed)
Delivery Note At 2:12 AM a viable female was delivered via Vaginal, Spontaneous Delivery (Presentation: ; Occiput Posterior).  APGAR: , ; weight .   Placenta status: Intact, Spontaneous.  Cord: 3 vessels with the following complications: None.  Cord pH: not deone  Anesthesia: Epidural  Episiotomy: Median Lacerations: 3rd degree Suture Repair: 2.0 vicryl Est. Blood Loss (mL):   Mom to postpartum.  Baby to Couplet care / Skin to Skin.  Diana Jacobs A 02/28/2013, 2:31 AM

## 2013-01-22 LAB — OB RESULTS CONSOLE GBS: STREP GROUP B AG: POSITIVE

## 2013-01-22 LAB — OB RESULTS CONSOLE GC/CHLAMYDIA
CHLAMYDIA, DNA PROBE: NEGATIVE
GC PROBE AMP, GENITAL: NEGATIVE

## 2013-02-26 ENCOUNTER — Telehealth (HOSPITAL_COMMUNITY): Payer: Self-pay | Admitting: *Deleted

## 2013-02-26 ENCOUNTER — Encounter (HOSPITAL_COMMUNITY): Payer: Self-pay | Admitting: *Deleted

## 2013-02-26 NOTE — Telephone Encounter (Signed)
Preadmission screen  

## 2013-02-27 ENCOUNTER — Encounter (HOSPITAL_COMMUNITY): Payer: Self-pay

## 2013-02-27 ENCOUNTER — Inpatient Hospital Stay (HOSPITAL_COMMUNITY)
Admission: RE | Admit: 2013-02-27 | Discharge: 2013-03-02 | DRG: 775 | Disposition: A | Discharge status: Home / Self Care | Payer: Medicaid Other | Source: Ambulatory Visit | Attending: Obstetrics | Admitting: Obstetrics

## 2013-02-27 ENCOUNTER — Encounter (HOSPITAL_COMMUNITY): Payer: Medicaid Other | Admitting: Anesthesiology

## 2013-02-27 ENCOUNTER — Inpatient Hospital Stay (HOSPITAL_COMMUNITY): Payer: Medicaid Other | Admitting: Anesthesiology

## 2013-02-27 DIAGNOSIS — O9989 Other specified diseases and conditions complicating pregnancy, childbirth and the puerperium: Secondary | ICD-10-CM

## 2013-02-27 DIAGNOSIS — Z22330 Carrier of Group B streptococcus: Secondary | ICD-10-CM

## 2013-02-27 DIAGNOSIS — D649 Anemia, unspecified: Secondary | ICD-10-CM | POA: Diagnosis present

## 2013-02-27 DIAGNOSIS — Z3490 Encounter for supervision of normal pregnancy, unspecified, unspecified trimester: Secondary | ICD-10-CM

## 2013-02-27 DIAGNOSIS — O99892 Other specified diseases and conditions complicating childbirth: Secondary | ICD-10-CM | POA: Diagnosis present

## 2013-02-27 DIAGNOSIS — O9902 Anemia complicating childbirth: Secondary | ICD-10-CM | POA: Diagnosis present

## 2013-02-27 LAB — CBC
HCT: 33.9 % — ABNORMAL LOW (ref 36.0–46.0)
HCT: 35.2 % — ABNORMAL LOW (ref 36.0–46.0)
Hemoglobin: 11.2 g/dL — ABNORMAL LOW (ref 12.0–15.0)
Hemoglobin: 11.9 g/dL — ABNORMAL LOW (ref 12.0–15.0)
MCH: 27.6 pg (ref 26.0–34.0)
MCH: 28.3 pg (ref 26.0–34.0)
MCHC: 33 g/dL (ref 30.0–36.0)
MCHC: 33.8 g/dL (ref 30.0–36.0)
MCV: 83.5 fL (ref 78.0–100.0)
MCV: 83.8 fL (ref 78.0–100.0)
PLATELETS: 193 10*3/uL (ref 150–400)
PLATELETS: 206 10*3/uL (ref 150–400)
RBC: 4.06 MIL/uL (ref 3.87–5.11)
RBC: 4.2 MIL/uL (ref 3.87–5.11)
RDW: 13.5 % (ref 11.5–15.5)
RDW: 13.6 % (ref 11.5–15.5)
WBC: 5.5 10*3/uL (ref 4.0–10.5)
WBC: 10.2 10*3/uL (ref 4.0–10.5)

## 2013-02-27 LAB — URINALYSIS, ROUTINE W REFLEX MICROSCOPIC
BILIRUBIN URINE: NEGATIVE
Glucose, UA: NEGATIVE mg/dL
Ketones, ur: 40 mg/dL — AB
Leukocytes, UA: NEGATIVE
Nitrite: NEGATIVE
PH: 6 (ref 5.0–8.0)
PROTEIN: 30 mg/dL — AB
Specific Gravity, Urine: 1.025 (ref 1.005–1.030)
Urobilinogen, UA: 0.2 mg/dL (ref 0.0–1.0)

## 2013-02-27 LAB — TYPE AND SCREEN
ABO/RH(D): B POS
Antibody Screen: NEGATIVE

## 2013-02-27 LAB — COMPREHENSIVE METABOLIC PANEL
ALBUMIN: 3.1 g/dL — AB (ref 3.5–5.2)
ALK PHOS: 202 U/L — AB (ref 39–117)
ALT: 10 U/L (ref 0–35)
AST: 20 U/L (ref 0–37)
BUN: 7 mg/dL (ref 6–23)
CHLORIDE: 99 meq/L (ref 96–112)
CO2: 21 meq/L (ref 19–32)
CREATININE: 0.75 mg/dL (ref 0.50–1.10)
Calcium: 9.1 mg/dL (ref 8.4–10.5)
GFR calc Af Amer: >90 mL/min (ref >90)
GFR calc non Af Amer: >90 mL/min (ref >90)
Glucose, Bld: 98 mg/dL (ref 70–99)
POTASSIUM: 4.3 meq/L (ref 3.7–5.3)
SODIUM: 136 meq/L — AB (ref 137–147)
Total Bilirubin: 0.4 mg/dL (ref 0.3–1.2)
Total Protein: 7.7 g/dL (ref 6.0–8.3)

## 2013-02-27 LAB — LACTATE DEHYDROGENASE: LDH: 293 U/L — AB (ref 94–250)

## 2013-02-27 LAB — RPR: RPR Ser Ql: NONREACTIVE

## 2013-02-27 LAB — URINE MICROSCOPIC-ADD ON

## 2013-02-27 LAB — URIC ACID: URIC ACID, SERUM: 4.8 mg/dL (ref 2.4–7.0)

## 2013-02-27 MED ORDER — MISOPROSTOL 25 MCG QUARTER TABLET
25.0000 ug | ORAL_TABLET | Freq: Four times a day (QID) | ORAL | Status: AC
  Administered 2013-02-27: 25 ug via VAGINAL
  Filled 2013-02-27: qty 0.25

## 2013-02-27 MED ORDER — CITRIC ACID-SODIUM CITRATE 334-500 MG/5ML PO SOLN: 30.0000 mL | ORAL | Status: DC | PRN

## 2013-02-27 MED ORDER — DIPHENHYDRAMINE HCL 50 MG/ML IJ SOLN
12.5000 mg | INTRAMUSCULAR | Status: DC | PRN
  Administered 2013-02-27 (×2): 12.5 mg via INTRAVENOUS

## 2013-02-27 MED ORDER — EPHEDRINE 5 MG/ML INJ
INTRAVENOUS | Status: AC
  Filled 2013-02-27: qty 4

## 2013-02-27 MED ORDER — PHENYLEPHRINE 40 MCG/ML (10ML) SYRINGE FOR IV PUSH (FOR BLOOD PRESSURE SUPPORT): 80.0000 ug | PREFILLED_SYRINGE | INTRAVENOUS | Status: DC | PRN

## 2013-02-27 MED ORDER — FLEET ENEMA 7-19 GM/118ML RE ENEM
1.0000 | ENEMA | RECTAL | Status: DC | PRN
Start: 2013-02-27 — End: 2013-02-28

## 2013-02-27 MED ORDER — OXYCODONE-ACETAMINOPHEN 5-325 MG PO TABS: 1.0000 | ORAL_TABLET | ORAL | Status: DC | PRN

## 2013-02-27 MED ORDER — FENTANYL 2.5 MCG/ML BUPIVACAINE 1/10 % EPIDURAL INFUSION (WH - ANES): 14.0000 mL/h | INTRAMUSCULAR | Status: DC | PRN

## 2013-02-27 MED ORDER — PENICILLIN G POTASSIUM 5000000 UNITS IJ SOLR
5.0000 10*6.[IU] | Freq: Once | INTRAVENOUS | Status: AC
  Administered 2013-02-27: 5 10*6.[IU] via INTRAVENOUS
  Filled 2013-02-27: qty 5

## 2013-02-27 MED ORDER — OXYTOCIN 40 UNITS IN LACTATED RINGERS INFUSION - SIMPLE MED
1.0000 m[IU]/min | INTRAVENOUS | Status: DC
  Administered 2013-02-27: 2 m[IU]/min via INTRAVENOUS
  Filled 2013-02-27: qty 1000

## 2013-02-27 MED ORDER — LACTATED RINGERS IV SOLN: 500.0000 mL | Freq: Once | INTRAVENOUS | Status: DC

## 2013-02-27 MED ORDER — MAGNESIUM SULFATE BOLUS VIA INFUSION
4.0000 g | Freq: Once | INTRAVENOUS | Status: DC
  Filled 2013-02-27: qty 500

## 2013-02-27 MED ORDER — EPHEDRINE 5 MG/ML INJ: 10.0000 mg | INTRAVENOUS | Status: DC | PRN

## 2013-02-27 MED ORDER — BUTORPHANOL TARTRATE 1 MG/ML IJ SOLN
1.0000 mg | INTRAMUSCULAR | Status: DC | PRN
  Filled 2013-02-27: qty 1

## 2013-02-27 MED ORDER — FENTANYL 2.5 MCG/ML BUPIVACAINE 1/10 % EPIDURAL INFUSION (WH - ANES)
14.0000 mL/h | INTRAMUSCULAR | Status: DC | PRN
  Administered 2013-02-27 (×2): 14 mL/h via EPIDURAL
  Filled 2013-02-27: qty 125

## 2013-02-27 MED ORDER — OXYTOCIN 40 UNITS IN LACTATED RINGERS INFUSION - SIMPLE MED
1.0000 m[IU]/min | INTRAVENOUS | Status: DC
  Administered 2013-02-27: 2 m[IU]/min via INTRAVENOUS

## 2013-02-27 MED ORDER — LACTATED RINGERS IV SOLN: 500.0000 mL | INTRAVENOUS | Status: DC | PRN

## 2013-02-27 MED ORDER — ACETAMINOPHEN 325 MG PO TABS: 650.0000 mg | ORAL_TABLET | ORAL | Status: DC | PRN

## 2013-02-27 MED ORDER — ONDANSETRON HCL 4 MG/2ML IJ SOLN: 4.0000 mg | Freq: Four times a day (QID) | INTRAMUSCULAR | Status: DC | PRN

## 2013-02-27 MED ORDER — FENTANYL 2.5 MCG/ML BUPIVACAINE 1/10 % EPIDURAL INFUSION (WH - ANES)
INTRAMUSCULAR | Status: AC
  Filled 2013-02-27: qty 125

## 2013-02-27 MED ORDER — LIDOCAINE HCL (PF) 1 % IJ SOLN
30.0000 mL | INTRAMUSCULAR | Status: AC | PRN
  Administered 2013-02-27 (×2): 5 mL via SUBCUTANEOUS

## 2013-02-27 MED ORDER — PENICILLIN G POTASSIUM 5000000 UNITS IJ SOLR
2.5000 10*6.[IU] | INTRAMUSCULAR | Status: DC
  Administered 2013-02-27 – 2013-02-28 (×4): 2.5 10*6.[IU] via INTRAVENOUS
  Filled 2013-02-27 (×8): qty 2.5

## 2013-02-27 MED ORDER — LACTATED RINGERS IV SOLN
INTRAVENOUS | Status: DC
  Administered 2013-02-27 – 2013-02-28 (×3): via INTRAVENOUS

## 2013-02-27 MED ORDER — PHENYLEPHRINE 40 MCG/ML (10ML) SYRINGE FOR IV PUSH (FOR BLOOD PRESSURE SUPPORT)
PREFILLED_SYRINGE | INTRAVENOUS | Status: AC
  Filled 2013-02-27: qty 10

## 2013-02-27 MED ORDER — DIPHENHYDRAMINE HCL 50 MG/ML IJ SOLN
12.5000 mg | INTRAMUSCULAR | Status: DC | PRN
  Filled 2013-02-27 (×2): qty 1

## 2013-02-27 MED ORDER — LIDOCAINE HCL (PF) 1 % IJ SOLN
INTRAMUSCULAR | Status: AC
  Administered 2013-02-28: 30 mL
  Filled 2013-02-27: qty 30

## 2013-02-27 MED ORDER — IBUPROFEN 600 MG PO TABS
600.0000 mg | ORAL_TABLET | Freq: Four times a day (QID) | ORAL | Status: DC | PRN
  Administered 2013-02-28: 600 mg via ORAL
  Filled 2013-02-27: qty 1

## 2013-02-27 MED ORDER — OXYTOCIN 40 UNITS IN LACTATED RINGERS INFUSION - SIMPLE MED
62.5000 mL/h | INTRAVENOUS | Status: DC
  Administered 2013-02-28: 62.5 mL/h via INTRAVENOUS

## 2013-02-27 MED ORDER — MAGNESIUM SULFATE 40 G IN LACTATED RINGERS - SIMPLE
2.0000 g/h | INTRAVENOUS | Status: DC
  Administered 2013-02-27: 4 g/h via INTRAVENOUS
  Filled 2013-02-27 (×2): qty 500

## 2013-02-27 MED ORDER — OXYTOCIN BOLUS FROM INFUSION: 500.0000 mL | INTRAVENOUS | Status: DC

## 2013-02-27 MED ORDER — TERBUTALINE SULFATE 1 MG/ML IJ SOLN: 0.2500 mg | Freq: Once | INTRAMUSCULAR | Status: AC | PRN

## 2013-02-27 NOTE — H&P (Signed)
This is Diana Jacobs dictating the history and physical on  Diana Jacobs she is a 23 year old gravida 2 para 0010 at 40 weeks EDC 225 HEENT patient desires induction positive GBS and received penicillin on admission she was started on low-dose Pitocin and is having occasional contraction that was discontinued cervix 1 cm 80% presenting part floating so instead Cytotec was inserted since she was not contracting amniotomy an attempt was done however the no fluid obtained so patient will get Cytotec x2 to initiate labor Past medical history negative Past surgical history negative Social history negative System review negative Physical exam well-developed female in no distress HEENT negative Lungs clear to P&A Heart regular rhythm no murmurs no gallops Abdomen term Pelvic as described above

## 2013-02-27 NOTE — Anesthesia Procedure Notes (Signed)
Epidural Patient location during procedure: OB Start time: 02/27/2013 4:08 PM End time: 02/27/2013 4:18 PM  Staffing Anesthesiologist: Meghana Tullo, CHRIS Performed by: anesthesiologist   Preanesthetic Checklist Completed: patient identified, surgical consent, pre-op evaluation, timeout performed, IV checked, risks and benefits discussed and monitors and equipment checked  Epidural Patient position: sitting Prep: site prepped and draped and DuraPrep Patient monitoring: heart rate, cardiac monitor, continuous pulse ox and blood pressure Approach: midline Location: L3-L4 Injection technique: LOR saline  Needle:  Needle type: Tuohy  Needle gauge: 17 G Needle length: 9 cm Needle insertion depth: 8 cm Catheter type: closed end flexible Catheter size: 19 Gauge Catheter at skin depth: 14 cm Test dose: Other  Assessment Events: blood not aspirated, injection not painful, no injection resistance, negative IV test and no paresthesia  Additional Notes H+P and labs checked, risks and benefits discussed with the patient, consent obtained, procedure tolerated well and without complications.  Reason for block:procedure for pain

## 2013-02-27 NOTE — Progress Notes (Signed)
Provider Attempted to AROM--unsuccessful--orders to stop pitocin and start cytotec x2 doses

## 2013-02-27 NOTE — Anesthesia Preprocedure Evaluation (Signed)
Anesthesia Evaluation  Patient identified by MRN, date of birth, ID band Patient awake    Reviewed: Allergy & Precautions, H&P , NPO status , Patient's Chart, lab work & pertinent test results  History of Anesthesia Complications Negative for: history of anesthetic complications  Airway Mallampati: III TM Distance: >3 FB Neck ROM: Full    Dental  (+) Teeth Intact   Pulmonary neg pulmonary ROS,    Pulmonary exam normal       Cardiovascular negative cardio ROS  Rhythm:Regular Rate:Normal     Neuro/Psych negative neurological ROS  negative psych ROS   GI/Hepatic negative GI ROS, Neg liver ROS,   Endo/Other  negative endocrine ROS  Renal/GU negative Renal ROS     Musculoskeletal negative musculoskeletal ROS (+)   Abdominal   Peds  Hematology  (+) anemia ,   Anesthesia Other Findings   Reproductive/Obstetrics (+) Pregnancy                           Anesthesia Physical Anesthesia Plan  ASA: II  Anesthesia Plan: Epidural   Post-op Pain Management:    Induction:   Airway Management Planned: Natural Airway  Additional Equipment: None  Intra-op Plan:   Post-operative Plan:   Informed Consent: I have reviewed the patients History and Physical, chart, labs and discussed the procedure including the risks, benefits and alternatives for the proposed anesthesia with the patient or authorized representative who has indicated his/her understanding and acceptance.   Dental advisory given  Plan Discussed with: Anesthesiologist  Anesthesia Plan Comments:         Anesthesia Quick Evaluation

## 2013-02-28 ENCOUNTER — Encounter (HOSPITAL_COMMUNITY): Payer: Self-pay

## 2013-02-28 LAB — CBC
HEMATOCRIT: 30.1 % — AB (ref 36.0–46.0)
Hemoglobin: 10.1 g/dL — ABNORMAL LOW (ref 12.0–15.0)
MCH: 27.9 pg (ref 26.0–34.0)
MCHC: 33.6 g/dL (ref 30.0–36.0)
MCV: 83.1 fL (ref 78.0–100.0)
PLATELETS: 185 10*3/uL (ref 150–400)
RBC: 3.62 MIL/uL — ABNORMAL LOW (ref 3.87–5.11)
RDW: 13.6 % (ref 11.5–15.5)
WBC: 10.6 10*3/uL — AB (ref 4.0–10.5)

## 2013-02-28 LAB — MRSA PCR SCREENING: MRSA BY PCR: NEGATIVE

## 2013-02-28 MED ORDER — LANOLIN HYDROUS EX OINT: TOPICAL_OINTMENT | CUTANEOUS | Status: DC | PRN

## 2013-02-28 MED ORDER — IBUPROFEN 600 MG PO TABS
600.0000 mg | ORAL_TABLET | Freq: Four times a day (QID) | ORAL | Status: DC
  Administered 2013-02-28 – 2013-03-02 (×8): 600 mg via ORAL
  Filled 2013-02-28 (×8): qty 1

## 2013-02-28 MED ORDER — ONDANSETRON HCL 4 MG PO TABS: 4.0000 mg | ORAL_TABLET | ORAL | Status: DC | PRN

## 2013-02-28 MED ORDER — BENZOCAINE-MENTHOL 20-0.5 % EX AERO
1.0000 "application " | INHALATION_SPRAY | CUTANEOUS | Status: DC | PRN
  Administered 2013-03-01: 1 via TOPICAL
  Filled 2013-02-28 (×3): qty 56

## 2013-02-28 MED ORDER — DIPHENHYDRAMINE HCL 25 MG PO CAPS: 25.0000 mg | ORAL_CAPSULE | Freq: Four times a day (QID) | ORAL | Status: DC | PRN

## 2013-02-28 MED ORDER — WITCH HAZEL-GLYCERIN EX PADS: 1.0000 "application " | MEDICATED_PAD | CUTANEOUS | Status: DC | PRN

## 2013-02-28 MED ORDER — SENNOSIDES-DOCUSATE SODIUM 8.6-50 MG PO TABS
2.0000 | ORAL_TABLET | ORAL | Status: DC
  Administered 2013-02-28: 2 via ORAL
  Filled 2013-02-28: qty 2

## 2013-02-28 MED ORDER — TETANUS-DIPHTH-ACELL PERTUSSIS 5-2.5-18.5 LF-MCG/0.5 IM SUSP
0.5000 mL | Freq: Once | INTRAMUSCULAR | Status: AC
  Administered 2013-03-01: 0.5 mL via INTRAMUSCULAR
  Filled 2013-02-28 (×2): qty 0.5

## 2013-02-28 MED ORDER — DIBUCAINE 1 % RE OINT
1.0000 "application " | TOPICAL_OINTMENT | RECTAL | Status: DC | PRN
  Filled 2013-02-28: qty 28

## 2013-02-28 MED ORDER — ONDANSETRON HCL 4 MG/2ML IJ SOLN: 4.0000 mg | INTRAMUSCULAR | Status: DC | PRN

## 2013-02-28 MED ORDER — OXYCODONE-ACETAMINOPHEN 5-325 MG PO TABS: 1.0000 | ORAL_TABLET | ORAL | Status: DC | PRN

## 2013-02-28 MED ORDER — LABETALOL HCL 200 MG PO TABS
200.0000 mg | ORAL_TABLET | Freq: Three times a day (TID) | ORAL | Status: DC
  Filled 2013-02-28: qty 1

## 2013-02-28 MED ORDER — SIMETHICONE 80 MG PO CHEW: 80.0000 mg | CHEWABLE_TABLET | ORAL | Status: DC | PRN

## 2013-02-28 MED ORDER — LACTATED RINGERS IV SOLN
INTRAVENOUS | Status: DC
  Administered 2013-02-28 – 2013-03-01 (×2): via INTRAVENOUS

## 2013-02-28 MED ORDER — FERROUS SULFATE 325 (65 FE) MG PO TABS
325.0000 mg | ORAL_TABLET | Freq: Two times a day (BID) | ORAL | Status: DC
  Administered 2013-02-28 – 2013-03-02 (×5): 325 mg via ORAL
  Filled 2013-02-28 (×7): qty 1

## 2013-02-28 MED ORDER — PRENATAL MULTIVITAMIN CH
1.0000 | ORAL_TABLET | Freq: Every day | ORAL | Status: DC
  Administered 2013-02-28 – 2013-03-01 (×2): 1 via ORAL
  Filled 2013-02-28 (×2): qty 1

## 2013-02-28 MED ORDER — LABETALOL HCL 200 MG PO TABS
200.0000 mg | ORAL_TABLET | Freq: Three times a day (TID) | ORAL | Status: DC
  Administered 2013-02-28 – 2013-03-02 (×7): 200 mg via ORAL
  Filled 2013-02-28 (×7): qty 1

## 2013-02-28 MED ORDER — INFLUENZA VAC SPLIT QUAD 0.5 ML IM SUSP
0.5000 mL | INTRAMUSCULAR | Status: AC
  Administered 2013-03-01: 0.5 mL via INTRAMUSCULAR
  Filled 2013-02-28 (×2): qty 0.5

## 2013-02-28 MED ORDER — ZOLPIDEM TARTRATE 5 MG PO TABS: 5.0000 mg | ORAL_TABLET | Freq: Every evening | ORAL | Status: DC | PRN

## 2013-02-28 NOTE — Progress Notes (Signed)
Give Patient Care Guide, advised of patient rights while hospitalized, given advanced directive brochure and enc to ask for further information if needed.  Patient teaching reviewed.

## 2013-02-28 NOTE — Progress Notes (Signed)
Patient ID: Diana CluckMercedes G Jacobs, female   DOB: March 08, 1990, 23 y.o.   MRN: [id number] Postpartum day 0 Blood pressure is normal Fundus firm Lochia moderate Negative Patient's on magnesium sulfate 2 g per hour and labetalol 200 3 times a day

## 2013-03-01 NOTE — Anesthesia Postprocedure Evaluation (Signed)
  Anesthesia Post-op Note  Patient: Diana Jacobs  Procedure(s) Performed: * No procedures listed *  Patient Location: A-ICU  Anesthesia Type:Epidural  Level of Consciousness: awake and alert   Airway and Oxygen Therapy: Patient Spontanous Breathing  Post-op Pain: mild  Post-op Assessment: Patient's Cardiovascular Status Stable, Respiratory Function Stable, No signs of Nausea or vomiting, Pain level controlled, No headache, No residual numbness and No residual motor weakness  Post-op Vital Signs: stable  Complications: No apparent anesthesia complications

## 2013-03-01 NOTE — Progress Notes (Signed)
UR chart review completed.  

## 2013-03-01 NOTE — Progress Notes (Signed)
Patient ID: Diana Jacobs, female   DOB: 02-15-1990, 23 y.o.   MRN: [id number] Postpartum day one Vital signs normal on Fundus firm Legs negative discontinue magnesium sulfate today and transfer and and

## 2013-03-02 NOTE — Discharge Summary (Signed)
Obstetric Discharge Summary Reason for Admission: induction of labor Prenatal Procedures: none Intrapartum Procedures: spontaneous vaginal delivery Postpartum Procedures: none Complications-Operative and Postpartum: none Hemoglobin  Date Value Ref Range Status  02/28/2013 10.1* 12.0 - 15.0 g/dL Final     HCT  Date Value Ref Range Status  02/28/2013 30.1* 36.0 - 46.0 % Final    Physical Exam:  General: alert Lochia: appropriate Uterine Fundus: firm Incision: healing well DVT Evaluation: No evidence of DVT seen on physical exam.  Discharge Diagnoses: Term Pregnancy-delivered  Discharge Information: Date: 03/02/2013 Activity: pelvic rest Diet: routine Medications: Percocet Condition: stable Instructions: refer to practice specific booklet Discharge to: home Follow-up Information   Follow up with Diana CosierMARSHALL,[name], MD.   Specialty:  Obstetrics and Gynecology   Contact information:   [address] [place] [address] [phone]       Newborn Data: Live born female  Birth Weight: 8 lb 4.8 oz (3765 g) APGAR: 7, 8  Home with mother.  Diana Jacobs Jacobs 03/02/2013, 7:53 AM

## 2013-03-02 NOTE — Discharge Instructions (Signed)
Discharge instructions ° °· You can wash your hair °· Shower °· Eat what you want °· Drink what you want °· See me in 6 weeks °· Your ankles are going to swell more in the next 2 weeks than when pregnant °· No sex for 6 weeks ° ° °Vihan Santagata A, MD 03/02/2013 ° ° °

## 2013-11-04 ENCOUNTER — Encounter (HOSPITAL_COMMUNITY): Payer: Self-pay

## 2013-11-26 ENCOUNTER — Encounter (HOSPITAL_COMMUNITY): Payer: Self-pay | Admitting: Emergency Medicine

## 2013-11-26 ENCOUNTER — Emergency Department (INDEPENDENT_AMBULATORY_CARE_PROVIDER_SITE_OTHER): Payer: Medicaid Other

## 2013-11-26 ENCOUNTER — Emergency Department (HOSPITAL_COMMUNITY)
Admission: EM | Admit: 2013-11-26 | Discharge: 2013-11-26 | Disposition: A | Discharge status: Home / Self Care | Payer: Medicaid Other | Source: Home / Self Care | Attending: Family Medicine | Admitting: Family Medicine

## 2013-11-26 DIAGNOSIS — S63502A Unspecified sprain of left wrist, initial encounter: Secondary | ICD-10-CM

## 2013-11-26 NOTE — Discharge Instructions (Signed)
Ice, splint and advil as needed. Return or see orthopedist if further problems.

## 2013-11-26 NOTE — ED Provider Notes (Signed)
CSN: [id number]     Arrival date & time 11/26/13  [id number] History   First MD Initiated Contact with Patient 11/26/13 1021     Chief Complaint  Patient presents with  . Wrist Injury   (Consider location/radiation/quality/duration/timing/severity/associated sxs/prior Treatment) Patient is a 23 y.o. female presenting with wrist injury. The history is provided by the patient.  Wrist Injury Location:  Wrist Time since incident:  12 hours Injury: yes   Mechanism of injury: fall   Fall:    Fall occurred:  Down stairs Wrist location:  L wrist Pain details:    Severity:  Moderate   Progression:  Unchanged Chronicity:  New Dislocation: no   Prior injury to area:  No Relieved by:  Ice Associated symptoms: decreased range of motion   Associated symptoms: no numbness and no tingling     Past Medical History  Diagnosis Date  . No pertinent past medical history   . Medical history non-contributory   . Herpes     told had herpes, no lesions   Past Surgical History  Procedure Laterality Date  . Eye surgery    . Tooth extraction    . Eye surgery     Family History  Problem Relation Age of Onset  . Hypertension Neg Hx   . Diabetes Neg Hx    History  Substance Use Topics  . Smoking status: Never Smoker   . Smokeless tobacco: Never Used  . Alcohol Use: No   OB History    Gravida Para Term Preterm AB TAB SAB Ectopic Multiple Living   2 1 1  1  1   1      Review of Systems  Constitutional: Negative.   Musculoskeletal: Positive for joint swelling.  Skin: Negative.     Allergies  Review of patient's allergies indicates no known allergies.  Home Medications   Prior to Admission medications   Not on File   LMP 11/05/2013  Breastfeeding? No Physical Exam  Constitutional: She is oriented to person, place, and time. She appears well-developed and well-nourished.  Musculoskeletal: She exhibits tenderness.       Left shoulder: Normal.       Left elbow: Normal.       Left  wrist: She exhibits decreased range of motion, tenderness, bony tenderness and swelling. She exhibits no deformity.       Left hand: Normal. She exhibits normal range of motion. Normal sensation noted. Normal strength noted.  Neurological: She is alert and oriented to person, place, and time.  Skin: Skin is warm and dry.  Nursing note and vitals reviewed.   ED Course  Procedures (including critical care time) Labs Review Labs Reviewed - No data to display  Imaging Review Dg Wrist Complete Left  11/26/2013   CLINICAL DATA:  Patient fell down stairs with pain after fall  EXAM: LEFT WRIST - COMPLETE 3+ VIEW  COMPARISON:  None.  FINDINGS: Frontal, oblique, lateral, and ulnar deviation scaphoid images were obtained. There is no demonstrable fracture or dislocation. Joint spaces appear intact. No erosive change.  IMPRESSION: No fracture or dislocation.  No appreciable arthropathy.   Electronically Signed   By: Bretta BangWilliam  Woodruff M.D.   On: 11/26/2013 10:59   X-rays reviewed and report per radiologist.   MDM   1. Sprain of wrist joint, left, initial encounter        Linna HoffJames D Lyssa Hackley, MD 11/26/13 1115

## 2013-11-26 NOTE — ED Notes (Signed)
Reports falling down steps last night injuring right wrist.  Having pain and limited ROM.

## 2013-12-10 ENCOUNTER — Encounter (HOSPITAL_COMMUNITY): Payer: Self-pay | Admitting: *Deleted

## 2013-12-10 ENCOUNTER — Inpatient Hospital Stay (HOSPITAL_COMMUNITY)
Admission: AD | Admit: 2013-12-10 | Discharge: 2013-12-10 | Disposition: A | Discharge status: Home / Self Care | Payer: Medicaid Other | Source: Ambulatory Visit | Attending: Obstetrics | Admitting: Obstetrics

## 2013-12-10 DIAGNOSIS — Z3201 Encounter for pregnancy test, result positive: Secondary | ICD-10-CM | POA: Diagnosis not present

## 2013-12-10 DIAGNOSIS — Z3200 Encounter for pregnancy test, result unknown: Secondary | ICD-10-CM | POA: Diagnosis present

## 2013-12-10 LAB — POCT PREGNANCY, URINE: Preg Test, Ur: POSITIVE — AB

## 2013-12-10 NOTE — MAU Note (Signed)
Here for preg test. Had pos test on 12/05.  Needs confirmation.  Asking if the dr can give the pills to pass the baby through.

## 2013-12-10 NOTE — MAU Note (Signed)
Urine in lab 

## 2013-12-10 NOTE — MAU Provider Note (Signed)
  History     CSN: [id number]  Arrival date and time: 12/10/13 0846   None     Chief Complaint  Patient presents with  . Possible Pregnancy   HPI Pt is a 23 yo G3P1011 at 5928w0d pregnancy here for pregnancy verification letter.  Denies abdominal pain or vaginal bleeding.  Patient's last menstrual period was 11/05/2013.  Pt verbalizes not desiring pregnancy.    Past Medical History  Diagnosis Date  . No pertinent past medical history   . Medical history non-contributory   . Herpes     told had herpes, no lesions    Past Surgical History  Procedure Laterality Date  . Eye surgery    . Tooth extraction    . Eye surgery      Family History  Problem Relation Age of Onset  . Hypertension Neg Hx   . Diabetes Neg Hx     History  Substance Use Topics  . Smoking status: Never Smoker   . Smokeless tobacco: Never Used  . Alcohol Use: No    Allergies: No Known Allergies  No prescriptions prior to admission    ROS  No symptoms at this time.  Physical Exam   Blood pressure 125/78, pulse 84, temperature 98 F (36.7 C), temperature source Oral, resp. rate 16, height 5' 3.5" (1.613 m), weight 97.523 kg (215 lb), last menstrual period 11/05/2013, not currently breastfeeding.  Physical Exam  Constitutional: She is oriented to person, place, and time. She appears well-developed and well-nourished. No distress.  HENT:  Head: Normocephalic.  Neck: Normal range of motion. Neck supple.  Neurological: She is alert and oriented to person, place, and time. She has normal reflexes.  Skin: Skin is warm and dry.    MAU Course  Procedures Results for orders placed or performed during the hospital encounter of 12/10/13 (from the past 24 hour(s))  Pregnancy, urine POC     Status: Abnormal   Collection Time: 12/10/13  9:17 AM  Result Value Ref Range   Preg Test, Ur POSITIVE (A) NEGATIVE     Assessment and Plan  23 yo G3P1011 at 1628w0d wks Pregnancy  Plan: Discharge to  home Pregnancy letter given.    Rochele PagesKARIM, Lounette Sloan N 12/10/2013, 9:29 AM

## 2014-01-28 ENCOUNTER — Encounter (HOSPITAL_COMMUNITY): Payer: Self-pay

## 2014-02-13 IMAGING — US US OB TRANSVAGINAL
1 series · 14 of 16 positions shown · non-contrast
Comparison: none

[Series 1: us ob transvaginal · 16 acquisitions, 14 frames shown]
[im 1/16]
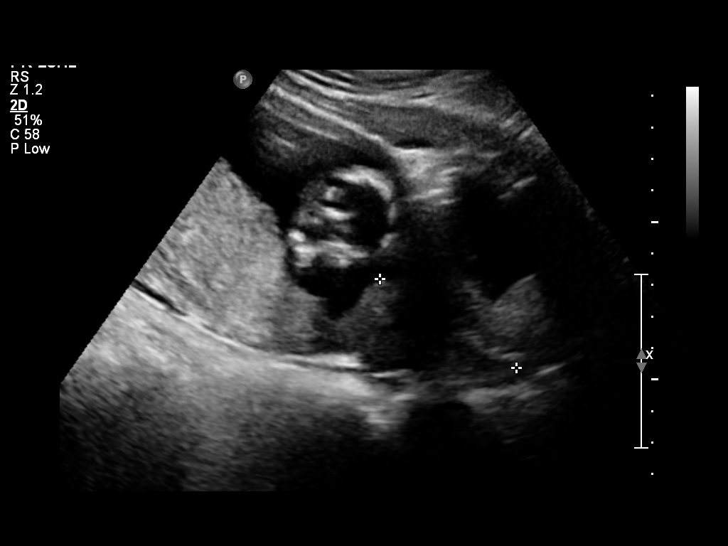
[im 2/16]
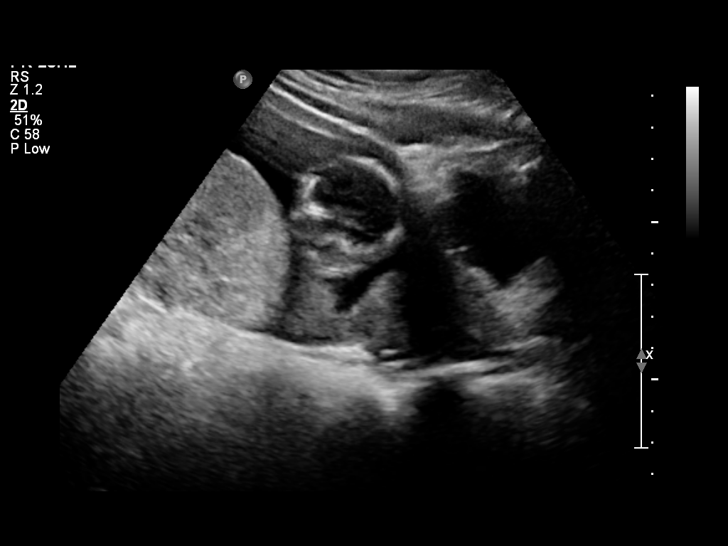
[im 3/16]
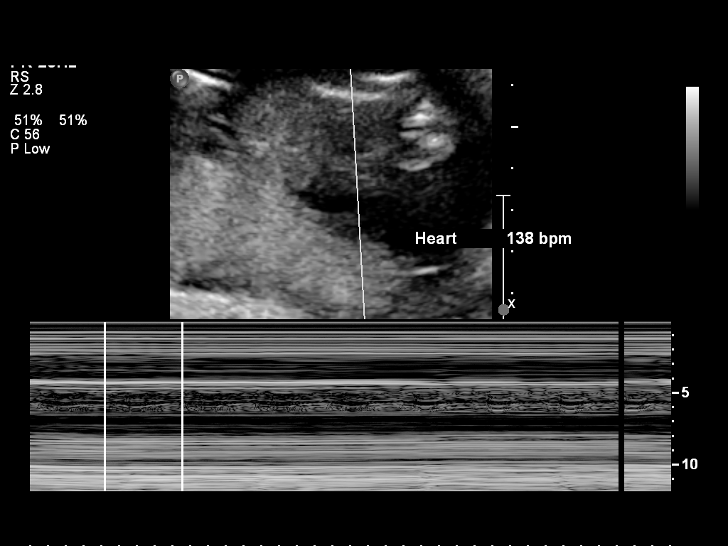
[im 5/16]
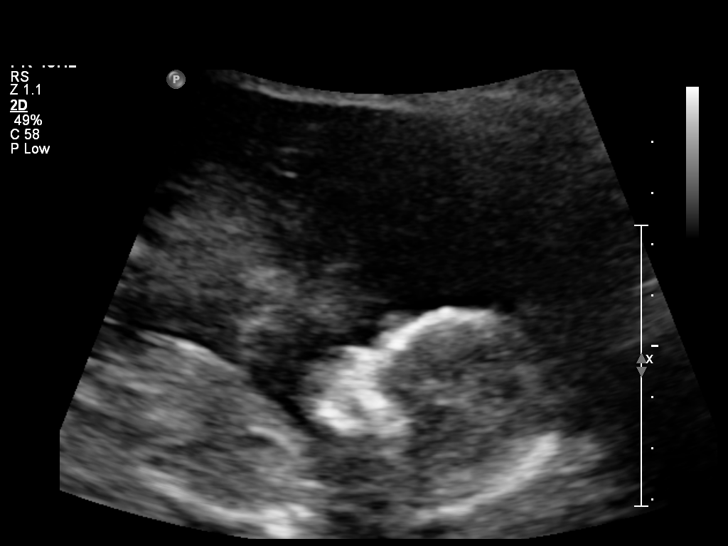
[im 6/16]
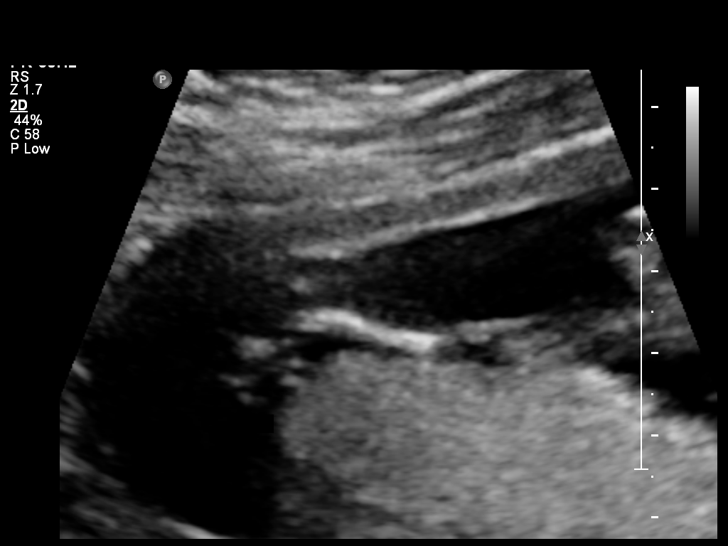
[im 7/16]
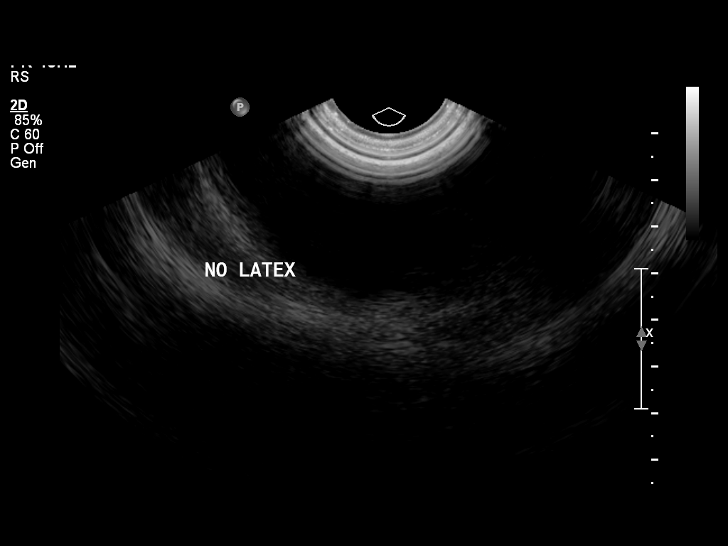
[im 8/16]
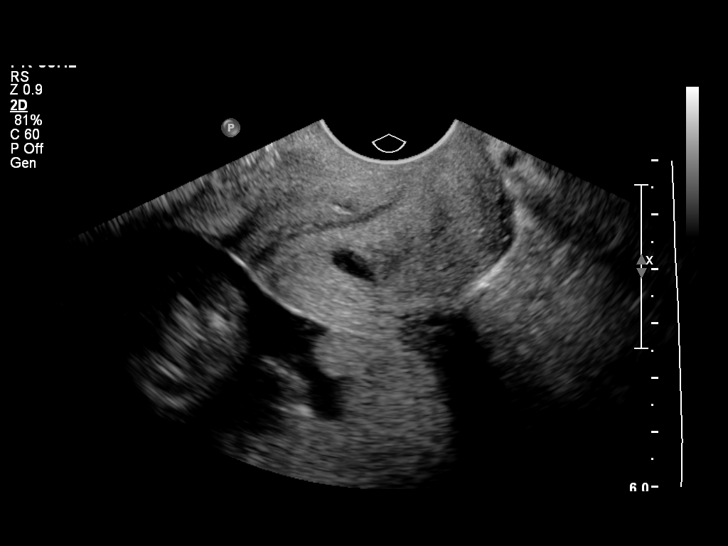
[im 9/16]
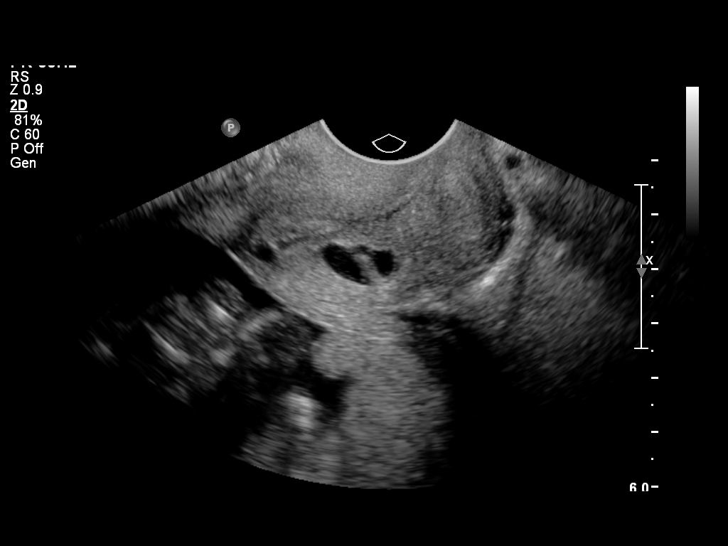
[im 10/16]
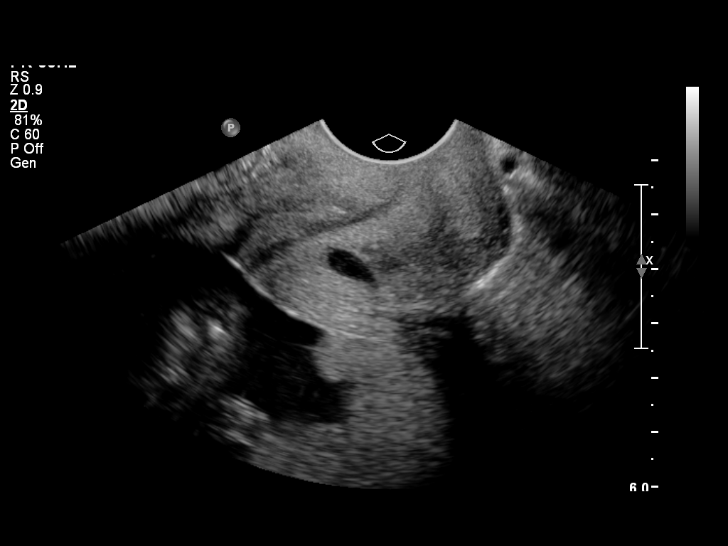
[im 11/16]
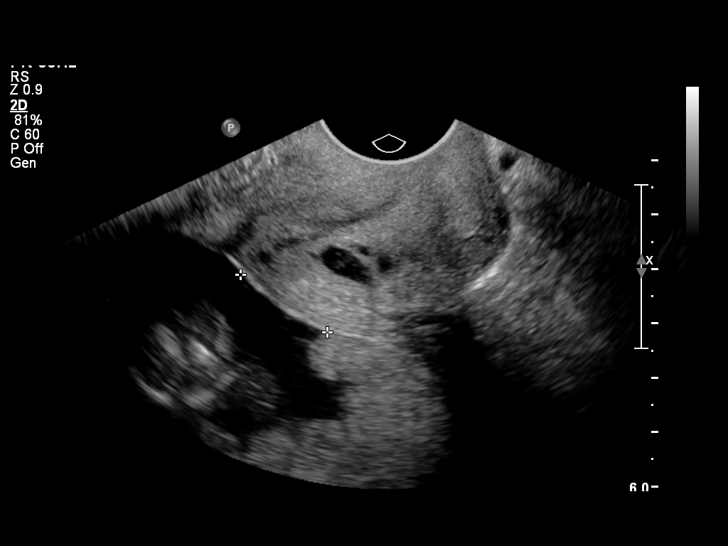
[im 13/16]
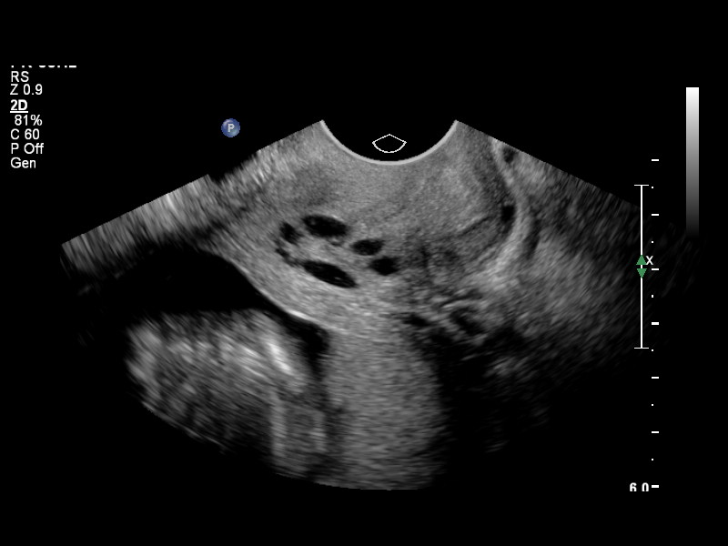
[im 14/16]
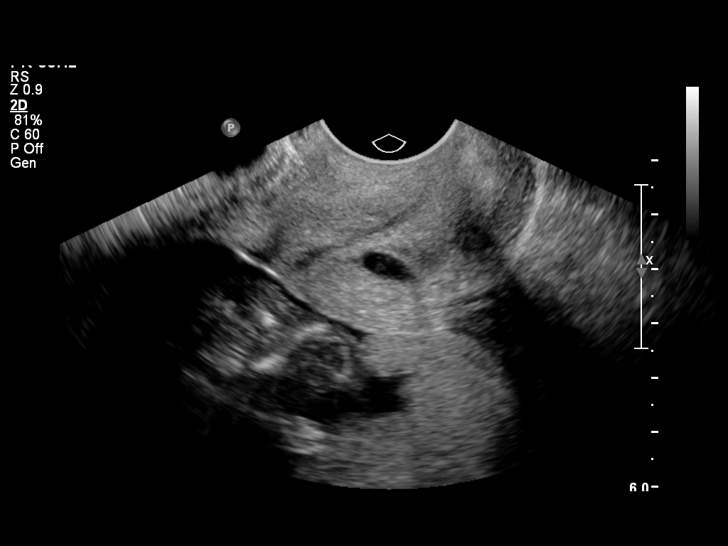
[im 15/16]
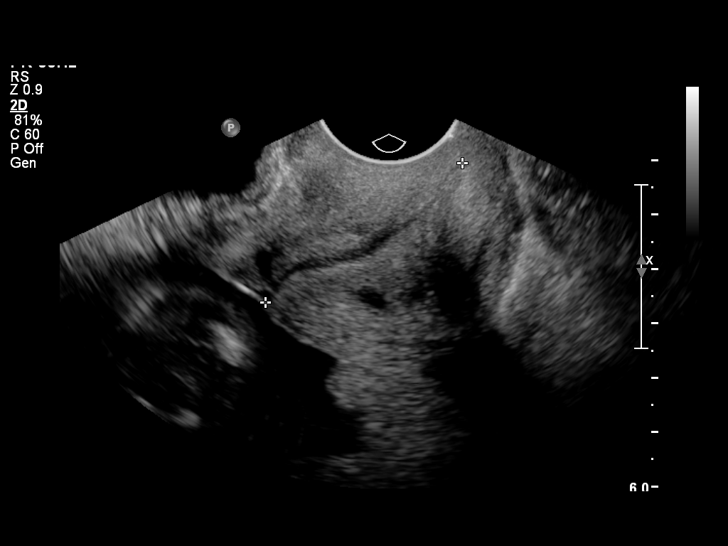
[im 16/16]
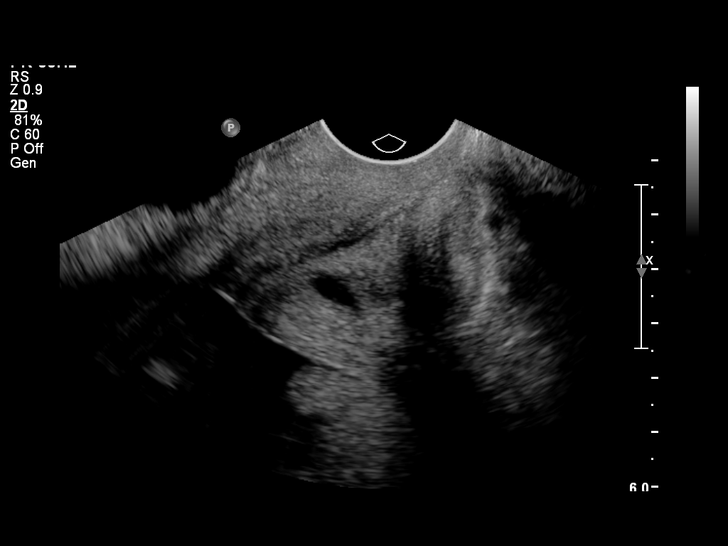

[14 of 16 positions shown; findings below may reference images not displayed]

OBSTETRICS REPORT
                      (Signed Final 09/11/2012 [DATE])

Service(s) Provided

 US OB TRANSVAGINAL                                    76817.0
Indications

 Threatened abortion; check cervical length
Fetal Evaluation

 Num Of Fetuses:    1
 Fetal Heart Rate:  138                          bpm
 Cardiac Activity:  Observed
 Presentation:      Cephalic
Gestational Age

 LMP:           15w 5d        Date:  05/23/12                 EDD:   02/27/13
 Best:          15w 5d     Det. By:  LMP  (05/23/12)          EDD:   02/27/13
Cervix Uterus Adnexa

 Cervical Length:    4.43     cm

 Cervix:       Normal appearance by transvaginal scan
Impression

 SIUP at 15+5 weeks
 Cardiac activity
 EV views of cervix: normal length (4.4 cms) without funneling;
 low-lying posterior placenta (of little significance at this GA)
Recommendations

 Offer anatomy U/S by 18 weeks; reassess of placental
 location

## 2014-06-09 ENCOUNTER — Encounter (HOSPITAL_COMMUNITY): Payer: Self-pay | Admitting: Emergency Medicine

## 2014-06-09 ENCOUNTER — Emergency Department (INDEPENDENT_AMBULATORY_CARE_PROVIDER_SITE_OTHER)
Admission: EM | Admit: 2014-06-09 | Discharge: 2014-06-09 | Disposition: A | Discharge status: Home / Self Care | Payer: Medicaid Other | Source: Home / Self Care | Attending: Family Medicine | Admitting: Family Medicine

## 2014-06-09 DIAGNOSIS — S239XXA Sprain of unspecified parts of thorax, initial encounter: Secondary | ICD-10-CM

## 2014-06-09 MED ORDER — METAXALONE 800 MG PO TABS: 800.0000 mg | ORAL_TABLET | Freq: Three times a day (TID) | ORAL | Status: DC

## 2014-06-09 NOTE — ED Provider Notes (Signed)
CSN: [id number]     Arrival date & time 06/09/14  [id number] History   First MD Initiated Contact with Patient 06/09/14 1743     Chief Complaint  Patient presents with  . Back Pain   (Consider location/radiation/quality/duration/timing/severity/associated sxs/prior Treatment) Patient is a 24 y.o. female presenting with back pain. The history is provided by the patient.  Back Pain Location:  Thoracic spine Quality:  Shooting Radiates to:  Does not radiate Pain severity:  Mild Onset quality:  Sudden Duration:  1 day Progression:  Unchanged Chronicity:  New Context comment:  Getting out of car yest am felt pain in left infrascapular back. Relieved by:  None tried Worsened by:  Nothing tried Associated symptoms: no numbness and no weakness     Past Medical History  Diagnosis Date  . No pertinent past medical history   . Medical history non-contributory   . Herpes     told had herpes, no lesions   Past Surgical History  Procedure Laterality Date  . Eye surgery    . Tooth extraction    . Eye surgery     Family History  Problem Relation Age of Onset  . Hypertension Neg Hx   . Diabetes Neg Hx    History  Substance Use Topics  . Smoking status: Never Smoker   . Smokeless tobacco: Never Used  . Alcohol Use: No   OB History    Gravida Para Term Preterm AB TAB SAB Ectopic Multiple Living   3 1 1  1  1   1      Review of Systems  Constitutional: Negative.   Musculoskeletal: Positive for back pain. Negative for joint swelling, gait problem and neck pain.  Skin: Negative.   Neurological: Negative for weakness and numbness.    Allergies  Review of patient's allergies indicates no known allergies.  Home Medications   Prior to Admission medications   Medication Sig Start Date End Date Taking? Authorizing Provider  metaxalone (SKELAXIN) 800 MG tablet Take 1 tablet (800 mg total) by mouth 3 (three) times daily. As muscle relaxer 06/09/14   Linna HoffJames D Abdulrahman Bracey, MD   BP 121/79 mmHg   Pulse 88  Temp(Src) 97.5 F (36.4 C) (Oral)  Resp 18  SpO2 99%  LMP 11/05/2013  Breastfeeding? Unknown Physical Exam  Constitutional: She is oriented to person, place, and time. She appears well-developed and well-nourished. No distress.  Pulmonary/Chest: Effort normal and breath sounds normal. She exhibits no tenderness.  Musculoskeletal: She exhibits tenderness.       Arms: Neurological: She is alert and oriented to person, place, and time.  Skin: Skin is warm and dry.  Nursing note and vitals reviewed.   ED Course  Procedures (including critical care time) Labs Review Labs Reviewed - No data to display  Imaging Review No results found.   MDM   1. Thoracic back sprain, initial encounter        Linna HoffJames D Levy Wellman, MD 06/09/14 [phone]

## 2014-06-09 NOTE — Discharge Instructions (Signed)
Heat, stretch and medicine as needed °

## 2014-06-09 NOTE — ED Notes (Signed)
Patient reports noticing left mid back pain yesterday morning when she was trying to get out of car.

## 2014-06-25 LAB — PROCEDURE REPORT - SCANNED: PAP SMEAR: ABNORMAL — AB

## 2014-08-29 ENCOUNTER — Encounter (HOSPITAL_COMMUNITY): Payer: Self-pay | Admitting: *Deleted

## 2014-08-29 ENCOUNTER — Emergency Department (INDEPENDENT_AMBULATORY_CARE_PROVIDER_SITE_OTHER)
Admission: EM | Admit: 2014-08-29 | Discharge: 2014-08-29 | Disposition: A | Discharge status: Home / Self Care | Payer: Medicaid Other | Source: Home / Self Care | Attending: Family Medicine | Admitting: Family Medicine

## 2014-08-29 DIAGNOSIS — J069 Acute upper respiratory infection, unspecified: Secondary | ICD-10-CM | POA: Diagnosis not present

## 2014-08-29 LAB — POCT RAPID STREP A: STREPTOCOCCUS, GROUP A SCREEN (DIRECT): NEGATIVE

## 2014-08-29 NOTE — Discharge Instructions (Signed)
Drink lots of fluids, use lozenges as needed.return if needed °

## 2014-08-29 NOTE — ED Notes (Signed)
Pt  Reports   Symptoms  Of  sorethroat       With  Pain   When  She  Swallows        With  Onset  Of  Symptoms    X 4  Days

## 2014-08-29 NOTE — ED Provider Notes (Signed)
CSN: [id number]     Arrival date & time 08/29/14  1301 History   First MD Initiated Contact with Patient 08/29/14 1347     Chief Complaint  Patient presents with  . Sore Throat   (Consider location/radiation/quality/duration/timing/severity/associated sxs/prior Treatment) Patient is a 24 y.o. female presenting with pharyngitis. The history is provided by the patient.  Sore Throat This is a new problem. The current episode started more than 2 days ago. The problem has not changed since onset.The symptoms are aggravated by swallowing.    Past Medical History  Diagnosis Date  . No pertinent past medical history   . Medical history non-contributory   . Herpes     told had herpes, no lesions   Past Surgical History  Procedure Laterality Date  . Eye surgery    . Tooth extraction    . Eye surgery     Family History  Problem Relation Age of Onset  . Hypertension Neg Hx   . Diabetes Neg Hx    Social History  Substance Use Topics  . Smoking status: Never Smoker   . Smokeless tobacco: Never Used  . Alcohol Use: No   OB History    Gravida Para Term Preterm AB TAB SAB Ectopic Multiple Living   Review of Systems  Constitutional: Negative.   HENT: Positive for sore throat. Negative for congestion, postnasal drip and rhinorrhea.   Respiratory: Negative.   Cardiovascular: Negative.   Gastrointestinal: Negative.     Allergies  Review of patient's allergies indicates no known allergies.  Home Medications   Prior to Admission medications   Medication Sig Start Date End Date Taking? Authorizing Provider  metaxalone (SKELAXIN) 800 MG tablet Take 1 tablet (800 mg total) by mouth 3 (three) times daily. As muscle relaxer 06/09/14   Linna Hoff, MD   Meds Ordered and Administered this Visit  Medications - No data to display  BP 117/81 mmHg  Pulse 91  Temp(Src) 98.2 F (36.8 C) (Oral)  Resp 16  SpO2 98%  LMP 11/05/2013 No data found.   Physical Exam   Constitutional: She is oriented to person, place, and time. She appears well-developed and well-nourished. No distress.  HENT:  Head: Normocephalic.  Right Ear: External ear normal.  Left Ear: External ear normal.  Mouth/Throat: Oropharynx is clear and moist. No oropharyngeal exudate.  Eyes: Pupils are equal, round, and reactive to light.  Neck: Normal range of motion. Neck supple.  Cardiovascular: Normal heart sounds.   Pulmonary/Chest: Breath sounds normal.  Lymphadenopathy:    She has no cervical adenopathy.  Neurological: She is alert and oriented to person, place, and time.  Skin: Skin is warm and dry.  Nursing note and vitals reviewed.   ED Course  Procedures (including critical care time)  Labs Review Labs Reviewed  POCT RAPID STREP A   Strep neg  Imaging Review No results found.   Visual Acuity Review  Right Eye Distance:   Left Eye Distance:   Bilateral Distance:    Right Eye Near:   Left Eye Near:    Bilateral Near:         MDM   1. URI, acute        Linna Hoff, MD 08/29/14 1420

## 2014-08-31 LAB — CULTURE, GROUP A STREP

## 2014-09-01 NOTE — ED Notes (Signed)
Final report of strep testing available for review. Positive for strep, previously untreated. Discussed w JD Kindl, who authorized Clindamycin 300 mg TID #21, NR. Called and discussed w patient, and have called new Rx in to [facility] [place]. Left detailed VM on pharmacy  answering machine  

## 2014-09-02 NOTE — ED Notes (Signed)
Final report of strep testing available for review. Positive for strep, previously untreated. Discussed w JD Kindl, who authorized Clindamycin 300 mg TID #21, NR. Called and discussed w patient, and have called new Rx in to [facility] [place]. Left detailed VM on pharmacy  answering machine

## 2015-02-07 ENCOUNTER — Emergency Department (HOSPITAL_COMMUNITY)
Admission: EM | Admit: 2015-02-07 | Discharge: 2015-02-07 | Disposition: A | Discharge status: Home / Self Care | Payer: Medicaid Other | Attending: Emergency Medicine | Admitting: Emergency Medicine

## 2015-02-07 ENCOUNTER — Encounter (HOSPITAL_COMMUNITY): Payer: Self-pay | Admitting: *Deleted

## 2015-02-07 DIAGNOSIS — J020 Streptococcal pharyngitis: Secondary | ICD-10-CM | POA: Diagnosis not present

## 2015-02-07 DIAGNOSIS — Z79899 Other long term (current) drug therapy: Secondary | ICD-10-CM | POA: Insufficient documentation

## 2015-02-07 DIAGNOSIS — R000 Tachycardia, unspecified: Secondary | ICD-10-CM | POA: Insufficient documentation

## 2015-02-07 DIAGNOSIS — Z8619 Personal history of other infectious and parasitic diseases: Secondary | ICD-10-CM | POA: Diagnosis not present

## 2015-02-07 DIAGNOSIS — R509 Fever, unspecified: Secondary | ICD-10-CM | POA: Diagnosis present

## 2015-02-07 LAB — RAPID STREP SCREEN (MED CTR MEBANE ONLY): STREPTOCOCCUS, GROUP A SCREEN (DIRECT): POSITIVE — AB

## 2015-02-07 MED ORDER — AMOXICILLIN 500 MG PO CAPS
500.0000 mg | ORAL_CAPSULE | Freq: Once | ORAL | Status: AC
  Administered 2015-02-07: 500 mg via ORAL
  Filled 2015-02-07: qty 1

## 2015-02-07 MED ORDER — SODIUM CHLORIDE 0.9 % IV BOLUS (SEPSIS): 1000.0000 mL | Freq: Once | INTRAVENOUS | Status: DC

## 2015-02-07 MED ORDER — ACETAMINOPHEN 500 MG PO TABS
1000.0000 mg | ORAL_TABLET | Freq: Once | ORAL | Status: AC
  Administered 2015-02-07: 1000 mg via ORAL
  Filled 2015-02-07: qty 2

## 2015-02-07 MED ORDER — AMOXICILLIN 500 MG PO CAPS: 500.0000 mg | ORAL_CAPSULE | Freq: Two times a day (BID) | ORAL | Status: DC

## 2015-02-07 MED ORDER — IBUPROFEN 800 MG PO TABS: 800.0000 mg | ORAL_TABLET | Freq: Three times a day (TID) | ORAL | Status: DC

## 2015-02-07 NOTE — Discharge Instructions (Signed)
Your test was positive for strep throat. I will give you prescriptions for antibiotics as well as medicine to help with pain and fever.   Take medications as prescribed. Return to the emergency room for worsening condition or new concerning symptoms. Follow up with your regular doctor. If you don't have a regular doctor use one of the numbers below to establish a primary care doctor.   Emergency Department Resource Guide 1) Find a Doctor and Pay Out of Pocket Although you won't have to find out who is covered by your insurance plan, it is a good idea to ask around and get recommendations. You will then need to call the office and see if the doctor you have chosen will accept you as a new patient and what types of options they offer for patients who are self-pay. Some doctors offer discounts or will set up payment plans for their patients who do not have insurance, but you will need to ask so you aren't surprised when you get to your appointment.  2) Contact Your Local Health Department Not all health departments have doctors that can see patients for sick visits, but many do, so it is worth a call to see if yours does. If you don't know where your local health department is, you can check in your phone book. The CDC also has a tool to help you locate your state's health department, and many state websites also have listings of all of their local health departments.  3) Find a Walk-in Clinic If your illness is not likely to be very severe or complicated, you may want to try a walk in clinic. These are popping up all over the country in pharmacies, drugstores, and shopping centers. They're usually staffed by nurse practitioners or physician assistants that have been trained to treat common illnesses and complaints. They're usually fairly quick and inexpensive. However, if you have serious medical issues or chronic medical problems, these are probably not your best option.  No Primary Care  Doctor: - Call Health Connect at  [phone] - they can help you locate a primary care doctor that  accepts your insurance, provides certain services, etc. - Physician Referral [phone]  Emergency Department Resource Guide 1) Find a Doctor and Pay Out of Pocket Although you won't have to find out who is covered by your insurance plan, it is a good idea to ask around and get recommendations. You will then need to call the office and see if the doctor you have chosen will accept you as a new patient and what types of options they offer for patients who are self-pay. Some doctors offer discounts or will set up payment plans for their patients who do not have insurance, but you will need to ask so you aren't surprised when you get to your appointment.  2) Contact Your Local Health Department Not all health departments have doctors that can see patients for sick visits, but many do, so it is worth a call to see if yours does. If you don't know where your local health department is, you can check in your phone book. The CDC also has a tool to help you locate your state's health department, and many state websites also have listings of all of their local health departments.  3) Find a Walk-in Clinic If your illness is not likely to be very severe or complicated, you may want to try a walk in clinic. These are popping up all over the country in pharmacies,  drugstores, and shopping centers. They're usually staffed by nurse practitioners or physician assistants that have been trained to treat common illnesses and complaints. They're usually fairly quick and inexpensive. However, if you have serious medical issues or chronic medical problems, these are probably not your best option.  No Primary Care Doctor: - Call Health Connect at  [phone] - they can help you locate a primary care doctor that  accepts your insurance, provides certain services, etc. - Physician Referral Service-  [phone]  Chronic Pain Problems: Organization         Address  Phone   Notes  [facility]  [phone] Patients need to be referred by their primary care doctor.   Medication Assistance: Organization         Address  Phone   Notes  [facility] Medication [facility] [address] [place], [place] [address] [phone] --Must be a resident of [facility] [place] -- Must have NO insurance coverage whatsoever (no Medicaid/ Medicare, etc.) -- The pt. MUST have a primary care doctor that directs their care regularly and follows them in the community   MedAssist  [phone]   [employer]  [phone]    Agencies that provide inexpensive medical care: Organization         Address  Phone   Notes  [facility]  [phone]   [facility]    [phone]   [facility] [address] [place], [place] [address] [phone]   [facility] [address], [place] [phone]   [facility]    [phone]   [facility]    [phone]) [phone]   [facility] and [facility]  [address], [place] Phone:  [phone]) [phone], Fax:  [phone] Hours of Operation:  9 am - 6 pm, M-F.  Also accepts Medicaid/Medicare and self-pay.  [facility]  [address], [place] Phone: [phone], Fax: [phone]. Hours of Operation:  8:30 am - 5:30 pm, M-F.  Also accepts Medicaid and self-pay.  [facility] [place] [address], [place] Phone: [phone]   [facility] [address], [place], [place] [phone], Ext. [phone] Mondays & Thursdays: 7-9 AM.  First 15 patients are seen on a first come, first serve basis.    Medicaid-accepting [facility] Providers:  Organization         Address  Phone   Notes  [facility] [address],  [place] [phone] Also accepts self-pay patients.  [facility] [address], [place]  [phone]   [facility] [address], [place] [phone]   [facility] [address] [place] [address], [place] [phone]   Renaye RakersVeita Bland [address], [place]   [phone] Only accepts [place] patients after they have their name applied to their card.   Self-Pay (no insurance) in [facility] [place]:  Organization         Address  Phone   Notes  Sickle Cell Patients, [facility] [address], [place] [phone]   [facility] [address], [place] [phone]   [facility] [place]  [address], [place] [phone]   [facility]/Dr. Osei-Bonsu  [address] [place] or [address], [place] [phone] Phone number for both [place] and [place] locations is the same.  [facility] [address], [place] [phone]   [facility] [address] [place] or [address] [phone]   [facility] [address], [place] [phone], phone; [phone], fax Sees patients 1st and 3rd Saturday of every month.  Must not qualify for public or private insurance (i.e. Medicaid, Medicare, [place] Health Choice, Veterans' Benefits)  Household income should be no more than 200% of the poverty level The clinic cannot treat you if you are pregnant or think you are pregnant  Sexually transmitted diseases are not treated at the clinic.     Strep Throat Strep throat is a bacterial infection of the throat. Your health care provider may call the infection tonsillitis or pharyngitis, depending on whether there is swelling in the tonsils or at the back of the throat.  Strep throat is most common during the cold months of the year in children who are 15-71 years of age, but it can happen during any season in people of any age. This infection is spread from person to person (contagious) through coughing, sneezing, or close contact. CAUSES Strep throat is caused by the bacteria called Streptococcus pyogenes. RISK FACTORS This condition is more likely to develop in:  People who spend time in crowded places where the infection can spread easily.  People who have close contact with someone who has strep throat. SYMPTOMS Symptoms of this condition include:  Fever or chills.   Redness, swelling, or pain in the tonsils or throat.  Pain or difficulty when swallowing.  White or yellow spots on the tonsils or throat.  Swollen, tender glands in the neck or under the jaw.  Red rash all over the body (rare). DIAGNOSIS This condition is diagnosed by performing a rapid strep test or by taking a swab of your throat (throat culture test). Results from a rapid strep test are usually ready in a few minutes, but throat culture test results are available after one or two days. TREATMENT This condition is treated with antibiotic medicine. HOME CARE INSTRUCTIONS Medicines  Take over-the-counter and prescription medicines only as told by your health care provider.  Take your antibiotic as told by your health care provider. Do not stop taking the antibiotic even if you start to feel better.  Have family members who also have a sore throat or fever tested for strep throat. They may need antibiotics if they have the strep infection. Eating and Drinking  Do not share food, drinking cups, or personal items that could cause the infection to spread to other people.  If swallowing is difficult, try eating soft foods until your sore throat feels better.  Drink enough fluid to keep your urine clear or pale yellow. General Instructions  Gargle with a salt-water mixture 3-4  times per day or as needed. To make a salt-water mixture, completely dissolve -1 tsp of salt in 1 cup of warm water.  Make sure that all household members wash their hands well.  Get plenty of rest.  Stay home from school or work until you have been taking antibiotics for 24 hours.  Keep all follow-up visits as told by your health care provider. This is important. SEEK MEDICAL CARE IF:  The glands in  your neck continue to get bigger.  You develop a rash, cough, or earache.  You cough up a thick liquid that is green, yellow-brown, or bloody.  You have pain or discomfort that does not get better with medicine.  Your problems seem to be getting worse rather than better.  You have a fever. SEEK IMMEDIATE MEDICAL CARE IF:  You have new symptoms, such as vomiting, severe headache, stiff or painful neck, chest pain, or shortness of breath.  You have severe throat pain, drooling, or changes in your voice.  You have swelling of the neck, or the skin on the neck becomes red and tender.  You have signs of dehydration, such as fatigue, dry mouth, and decreased urination.  You become increasingly sleepy, or you cannot wake up completely.  Your joints become red or painful.   This information is not intended to replace advice given to you by your health care provider. Make sure you discuss any questions you have with your health care provider.   Document Released: 12/18/1999 Document Revised: 09/10/2014 Document Reviewed: 04/14/2014 Elsevier Interactive Patient Education [employer]

## 2015-02-07 NOTE — ED Notes (Signed)
Pt reports generalized body aches ,chills and fever. Pt denies any V/D .

## 2015-02-07 NOTE — ED Provider Notes (Signed)
CSN: [id number]     Arrival date & time 02/07/15  1027 History  By signing my name below, I, Placido Jacobs, attest that this documentation has been prepared under the direction and in the presence of Odelia Graciano Y [place], [place]. Electronically Signed: Placido Jacobs, ED Scribe. 02/07/2015. 12:50 PM.    Chief Complaint  Patient presents with  . Influenza   The history is provided by the patient. No language interpreter was used.    HPI Comments: Diana Jacobs is a 25 y.o. female who presents to the Emergency Department complaining of a constant, mild, fever onset yesterday morning (TMAX 100.5 F). She reports associated chills, body aches and sore throat when swallowing. Pt took OTC medication last night but is unsure of what it was. She denies recent travel, known sick contacts or any other known medical conditions. Pt denies cough, sneezing, rhinorrhea, SOB, abd pain, n/v, congestion or any other associated symptoms at this time.    Past Medical History  Diagnosis Date  . No pertinent past medical history   . Medical history non-contributory   . Herpes     told had herpes, no lesions   Past Surgical History  Procedure Laterality Date  . Eye surgery    . Tooth extraction    . Eye surgery     Family History  Problem Relation Age of Onset  . Hypertension Neg Hx   . Diabetes Neg Hx    Social History  Substance Use Topics  . Smoking status: Never Smoker   . Smokeless tobacco: Never Used  . Alcohol Use: No   OB History    Gravida Para Term Preterm AB TAB SAB Ectopic Multiple Living   Review of Systems  All other systems reviewed and are negative.   Allergies  Review of patient's allergies indicates no known allergies.  Home Medications   Prior to Admission medications   Medication Sig Start Date End Date Taking? Authorizing Provider  metaxalone (SKELAXIN) 800 MG tablet Take 1 tablet (800 mg total) by mouth 3 (three) times daily. As muscle relaxer  06/09/14   Linna Hoff, MD   BP 134/77 mmHg  Pulse 108  Temp(Src) 100.5 F (38.1 C) (Oral)  Resp 21  Ht  (1.651 m)  Wt 215 lb (97.523 kg)  BMI 35.78 kg/m2  SpO2 100%    Physical Exam  Constitutional: She is oriented to person, place, and time. She appears well-developed.  HENT:  Head: Normocephalic and atraumatic.  Mouth/Throat: Oropharyngeal exudate present.  Bilateral tonsillar exudate, erythema, and edema. Uvula midline. No trismus.   Eyes: EOM are normal.  Neck: Normal range of motion.  Cardiovascular: Normal rate.   Pulmonary/Chest: Effort normal. No respiratory distress.  Abdominal: Soft.  Musculoskeletal: Normal range of motion.  Lymphadenopathy:    She has cervical adenopathy (left anterior).  Neurological: She is alert and oriented to person, place, and time.  Skin: Skin is warm and dry.  Psychiatric: She has a normal mood and affect.  Nursing note and vitals reviewed.  Filed Vitals:   02/07/15 1043 02/07/15 1309  BP: 134/77 128/81  Pulse: 108 98  Temp: 100.5 F (38.1 C) 99.4 F (37.4 C)  TempSrc: Oral   Resp: 21 20  Height:  (1.651 m)   Weight: 97.523 kg   SpO2: 100% 100%     ED Course  Procedures  DIAGNOSTIC STUDIES: Oxygen Saturation is 100%  on RA, normal by my interpretation.    COORDINATION OF CARE: 11:32 AM Discussed next steps with pt including a rapid strep screening and reevaluation based on results of her lab work. Pt understood and is agreeable with the plan.   Labs Review Labs Reviewed  RAPID STREP SCREEN (NOT AT [facility]) - Abnormal; Notable for the following:    Streptococcus, Group A Screen (Direct) POSITIVE (*)    All other components within normal limits    Imaging Review No results found. I have personally reviewed and evaluated these lab results as part of my medical decision-making.   EKG Interpretation None      MDM   Final diagnoses:  Strep pharyngitis    Pt initially febrile to 100.5, tachycardic to 108  which i suspect secondary to fever. Pt denies chest pain or SOB. Tylenol given in the ED. Rapid strep positive. Rx given for amoxicillin which pt states she has tolerated well for strep throat in the past, first dose given in the ED. Temp improved to 99.39F and HR down to 98. Discussed with pt that she may also take NSAIDs when she gets home in addition to the tylenol we gave here. Encouraged plenty of fluid intake. Pt is otherwise nontoxic appearing and stable for discharge and outpatient follow up. Instructed to f/u with PCP early next week. Also gave rx for ibuprofen for fever and sore throat. ER return precautions given.   I personally performed the services described in this documentation, which was scribed in my presence. The recorded information has been reviewed and is accurate.   Carlene Coria, PA-C 02/08/15 1013  Diana Sou, MD 02/08/15 [phone]

## 2015-07-21 ENCOUNTER — Encounter: Payer: Self-pay | Admitting: Obstetrics & Gynecology

## 2015-07-21 ENCOUNTER — Ambulatory Visit (INDEPENDENT_AMBULATORY_CARE_PROVIDER_SITE_OTHER): Payer: Medicaid Other | Admitting: Obstetrics & Gynecology

## 2015-07-21 VITALS — Ht 64.5 in | Wt 236.0 lb

## 2015-07-21 DIAGNOSIS — Z0000 Encounter for general adult medical examination without abnormal findings: Secondary | ICD-10-CM | POA: Diagnosis not present

## 2015-07-21 DIAGNOSIS — R87610 Atypical squamous cells of undetermined significance on cytologic smear of cervix (ASC-US): Secondary | ICD-10-CM | POA: Insufficient documentation

## 2015-07-21 DIAGNOSIS — Z01419 Encounter for gynecological examination (general) (routine) without abnormal findings: Secondary | ICD-10-CM

## 2015-07-21 DIAGNOSIS — R87810 Cervical high risk human papillomavirus (HPV) DNA test positive: Secondary | ICD-10-CM

## 2015-07-21 DIAGNOSIS — R87612 Low grade squamous intraepithelial lesion on cytologic smear of cervix (LGSIL): Secondary | ICD-10-CM | POA: Insufficient documentation

## 2015-07-21 NOTE — Patient Instructions (Signed)
Thank you for enrolling in [facility]. Please follow the instructions below to securely access your online medical record. MyChart allows you to send messages to your doctor, view your test results, renew your prescriptions, schedule appointments, and more.  How Do I Sign Up? 1. In your Internet browser, go to [email or web address]. 2. Click on the New  User? link in the Sign In box.  3. Enter your MyChart Access Code exactly as it appears below. You will not need to use this code after you have completed the sign-up process. If you do not sign up before the expiration date, you must request a new code. MyChart Access Code: [email or web address] Expires: 09/06/2015 11:20 AM  4. Enter the last four digits of your Social Security Number (xxxx) and Date of Birth (mm/dd/yyyy) as indicated and click Next. You will be taken to the next sign-up page. 5. Create a MyChart ID. This will be your MyChart login ID and cannot be changed, so think of one that is secure and easy to remember. 6. Create a MyChart password. You can change your password at any time. 7. Enter your Password Reset Question and Answer and click Next. This can be used at a later time if you forget your password.  8. Select your communication preference, and if applicable enter your e-mail address. You will receive e-mail notification when new information is available in MyChart by choosing to receive e-mail notifications and filling in your e-mail. 9. Click Sign In. You can now view your medical record.   Additional Information If you have questions, you can email [email or web address] or call [phone] to talk to our [facility] staff. Remember, MyChart is NOT to be used for urgent needs. For medical emergencies, dial 911.   Preventive Care for Adults, Female A healthy lifestyle and preventive care can promote health and wellness. Preventive health guidelines for women include the following key practices.  A routine  yearly physical is a good way to check with your health care provider about your health and preventive screening. It is a chance to share any concerns and updates on your health and to receive a thorough exam.  Visit your dentist for a routine exam and preventive care every 6 months. Brush your teeth twice a day and floss once a day. Good oral hygiene prevents tooth decay and gum disease.  The frequency of eye exams is based on your age, health, family medical history, use of contact lenses, and other factors. Follow your health care provider's recommendations for frequency of eye exams.  Eat a healthy diet. Foods like vegetables, fruits, whole grains, low-fat dairy products, and lean protein foods contain the nutrients you need without too many calories. Decrease your intake of foods high in solid fats, added sugars, and salt. Eat the right amount of calories for you.Get information about a proper diet from your health care provider, if necessary.  Regular physical exercise is one of the most important things you can do for your health. Most adults should get at least 150 minutes of moderate-intensity exercise (any activity that increases your heart rate and causes you to sweat) each week. In addition, most adults need muscle-strengthening exercises on 2 or more days a week.  Maintain a healthy weight. The body mass index (BMI) is a screening tool to identify possible weight problems. It provides an estimate of body fat based on height and weight. Your health care provider can find your BMI and can help you achieve or maintain  a healthy weight.For adults 20 years and older:  A BMI below 18.5 is considered underweight.  A BMI of 18.5 to 24.9 is normal.  A BMI of 25 to 29.9 is considered overweight.  A BMI of 30 and above is considered obese.  Maintain normal blood lipids and cholesterol levels by exercising and minimizing your intake of saturated fat. Eat a balanced diet with plenty of fruit  and vegetables. Blood tests for lipids and cholesterol should begin at age 19 and be repeated every 5 years. If your lipid or cholesterol levels are high, you are over 50, or you are at high risk for heart disease, you may need your cholesterol levels checked more frequently.Ongoing high lipid and cholesterol levels should be treated with medicines if diet and exercise are not working.  If you smoke, find out from your health care provider how to quit. If you do not use tobacco, do not start.  Lung cancer screening is recommended for adults aged 77-80 years who are at high risk for developing lung cancer because of a history of smoking. A yearly low-dose CT scan of the lungs is recommended for people who have at least a 30-pack-year history of smoking and are a current smoker or have quit within the past 15 years. A pack year of smoking is smoking an average of 1 pack of cigarettes a day for 1 year (for example: 1 pack a day for 30 years or 2 packs a day for 15 years). Yearly screening should continue until the smoker has stopped smoking for at least 15 years. Yearly screening should be stopped for people who develop a health problem that would prevent them from having lung cancer treatment.  If you are pregnant, do not drink alcohol. If you are breastfeeding, be very cautious about drinking alcohol. If you are not pregnant and choose to drink alcohol, do not have more than 1 drink per day. One drink is considered to be 12 ounces (355 mL) of beer, 5 ounces (148 mL) of wine, or 1.5 ounces (44 mL) of liquor.  Avoid use of street drugs. Do not share needles with anyone. Ask for help if you need support or instructions about stopping the use of drugs.  High blood pressure causes heart disease and increases the risk of stroke. Your blood pressure should be checked at least every 1 to 2 years. Ongoing high blood pressure should be treated with medicines if weight loss and exercise do not work.  If you are  15-24 years old, ask your health care provider if you should take aspirin to prevent strokes.  Diabetes screening is done by taking a blood sample to check your blood glucose level after you have not eaten for a certain period of time (fasting). If you are not overweight and you do not have risk factors for diabetes, you should be screened once every 3 years starting at age 23. If you are overweight or obese and you are 46-12 years of age, you should be screened for diabetes every year as part of your cardiovascular risk assessment.  Breast cancer screening is essential preventive care for women. You should practice "breast self-awareness." This means understanding the normal appearance and feel of your breasts and may include breast self-examination. Any changes detected, no matter how small, should be reported to a health care provider. Women in their 23s and 30s should have a clinical breast exam (CBE) by a health care provider as part of a regular health exam every  1 to 3 years. After age 81, women should have a CBE every year. Starting at age 39, women should consider having a mammogram (breast X-ray test) every year. Women who have a family history of breast cancer should talk to their health care provider about genetic screening. Women at a high risk of breast cancer should talk to their health care providers about having an MRI and a mammogram every year.  Breast cancer gene (BRCA)-related cancer risk assessment is recommended for women who have family members with BRCA-related cancers. BRCA-related cancers include breast, ovarian, tubal, and peritoneal cancers. Having family members with these cancers may be associated with an increased risk for harmful changes (mutations) in the breast cancer genes BRCA1 and BRCA2. Results of the assessment will determine the need for genetic counseling and BRCA1 and BRCA2 testing.  Your health care provider may recommend that you be screened regularly for cancer  of the pelvic organs (ovaries, uterus, and vagina). This screening involves a pelvic examination, including checking for microscopic changes to the surface of your cervix (Pap test). You may be encouraged to have this screening done every 3 years, beginning at age 82.  For women ages 25-65, health care providers may recommend pelvic exams and Pap testing every 3 years, or they may recommend the Pap and pelvic exam, combined with testing for human papilloma virus (HPV), every 5 years. Some types of HPV increase your risk of cervical cancer. Testing for HPV may also be done on women of any age with unclear Pap test results.  Other health care providers may not recommend any screening for nonpregnant women who are considered low risk for pelvic cancer and who do not have symptoms. Ask your health care provider if a screening pelvic exam is right for you.  If you have had past treatment for cervical cancer or a condition that could lead to cancer, you need Pap tests and screening for cancer for at least 20 years after your treatment. If Pap tests have been discontinued, your risk factors (such as having a new sexual partner) need to be reassessed to determine if screening should resume. Some women have medical problems that increase the chance of getting cervical cancer. In these cases, your health care provider may recommend more frequent screening and Pap tests.  Colorectal cancer can be detected and often prevented. Most routine colorectal cancer screening begins at the age of 81 years and continues through age 22 years. However, your health care provider may recommend screening at an earlier age if you have risk factors for colon cancer. On a yearly basis, your health care provider may provide home test kits to check for hidden blood in the stool. Use of a small camera at the end of a tube, to directly examine the colon (sigmoidoscopy or colonoscopy), can detect the earliest forms of colorectal cancer. Talk  to your health care provider about this at age 48, when routine screening begins. Direct exam of the colon should be repeated every 5-10 years through age 17 years, unless early forms of precancerous polyps or small growths are found.  People who are at an increased risk for hepatitis B should be screened for this virus. You are considered at high risk for hepatitis B if:  You were born in a country where hepatitis B occurs often. Talk with your health care provider about which countries are considered high risk.  Your parents were born in a high-risk country and you have not received a shot to protect  against hepatitis B (hepatitis B vaccine).  You have HIV or AIDS.  You use needles to inject street drugs.  You live with, or have sex with, someone who has hepatitis B.  You get hemodialysis treatment.  You take certain medicines for conditions like cancer, organ transplantation, and autoimmune conditions.  Hepatitis C blood testing is recommended for all people born from [birth year] through 1967 and any individual with known risks for hepatitis C.  Practice safe sex. Use condoms and avoid high-risk sexual practices to reduce the spread of sexually transmitted infections (STIs). STIs include gonorrhea, chlamydia, syphilis, trichomonas, herpes, HPV, and human immunodeficiency virus (HIV). Herpes, HIV, and HPV are viral illnesses that have no cure. They can result in disability, cancer, and death.  You should be screened for sexually transmitted illnesses (STIs) including gonorrhea and chlamydia if:  You are sexually active and are younger than 24 years.  You are older than 24 years and your health care provider tells you that you are at risk for this type of infection.  Your sexual activity has changed since you were last screened and you are at an increased risk for chlamydia or gonorrhea. Ask your health care provider if you are at risk.  If you are at risk of being infected with HIV, it is  recommended that you take a prescription medicine daily to prevent HIV infection. This is called preexposure prophylaxis (PrEP). You are considered at risk if:  You are sexually active and do not regularly use condoms or know the HIV status of your partner(s).  You take drugs by injection.  You are sexually active with a partner who has HIV.  Talk with your health care provider about whether you are at high risk of being infected with HIV. If you choose to begin PrEP, you should first be tested for HIV. You should then be tested every 3 months for as long as you are taking PrEP.  Osteoporosis is a disease in which the bones lose minerals and strength with aging. This can result in serious bone fractures or breaks. The risk of osteoporosis can be identified using a bone density scan. Women ages 73 years and over and women at risk for fractures or osteoporosis should discuss screening with their health care providers. Ask your health care provider whether you should take a calcium supplement or vitamin D to reduce the rate of osteoporosis.  Menopause can be associated with physical symptoms and risks. Hormone replacement therapy is available to decrease symptoms and risks. You should talk to your health care provider about whether hormone replacement therapy is right for you.  Use sunscreen. Apply sunscreen liberally and repeatedly throughout the day. You should seek shade when your shadow is shorter than you. Protect yourself by wearing long sleeves, pants, a wide-brimmed hat, and sunglasses year round, whenever you are outdoors.  Once a month, do a whole body skin exam, using a mirror to look at the skin on your back. Tell your health care provider of new moles, moles that have irregular borders, moles that are larger than a pencil eraser, or moles that have changed in shape or color.  Stay current with required vaccines (immunizations).  Influenza vaccine. All adults should be immunized every  year.  Tetanus, diphtheria, and acellular pertussis (Td, Tdap) vaccine. Pregnant women should receive 1 dose of Tdap vaccine during each pregnancy. The dose should be obtained regardless of the length of time since the last dose. Immunization is preferred during the 27th-36th week  of gestation. An adult who has not previously received Tdap or who does not know her vaccine status should receive 1 dose of Tdap. This initial dose should be followed by tetanus and diphtheria toxoids (Td) booster doses every 10 years. Adults with an unknown or incomplete history of completing a 3-dose immunization series with Td-containing vaccines should begin or complete a primary immunization series including a Tdap dose. Adults should receive a Td booster every 10 years.  Varicella vaccine. An adult without evidence of immunity to varicella should receive 2 doses or a second dose if she has previously received 1 dose. Pregnant females who do not have evidence of immunity should receive the first dose after pregnancy. This first dose should be obtained before leaving the health care facility. The second dose should be obtained 4-8 weeks after the first dose.  Human papillomavirus (HPV) vaccine. Females aged 13-26 years who have not received the vaccine previously should obtain the 3-dose series. The vaccine is not recommended for use in pregnant females. However, pregnancy testing is not needed before receiving a dose. If a female is found to be pregnant after receiving a dose, no treatment is needed. In that case, the remaining doses should be delayed until after the pregnancy. Immunization is recommended for any person with an immunocompromised condition through the age of 39 years if she did not get any or all doses earlier. During the 3-dose series, the second dose should be obtained 4-8 weeks after the first dose. The third dose should be obtained 24 weeks after the first dose and 16 weeks after the second dose.  Zoster  vaccine. One dose is recommended for adults aged 72 years or older unless certain conditions are present.  Measles, mumps, and rubella (MMR) vaccine. Adults born before [birth year] generally are considered immune to measles and mumps. Adults born in [birth year] or later should have 1 or more doses of MMR vaccine unless there is a contraindication to the vaccine or there is laboratory evidence of immunity to each of the three diseases. A routine second dose of MMR vaccine should be obtained at least 28 days after the first dose for students attending postsecondary schools, health care workers, or international travelers. People who received inactivated measles vaccine or an unknown type of measles vaccine during 1963-1967 should receive 2 doses of MMR vaccine. People who received inactivated mumps vaccine or an unknown type of mumps vaccine before 1979 and are at high risk for mumps infection should consider immunization with 2 doses of MMR vaccine. For females of childbearing age, rubella immunity should be determined. If there is no evidence of immunity, females who are not pregnant should be vaccinated. If there is no evidence of immunity, females who are pregnant should delay immunization until after pregnancy. Unvaccinated health care workers born before [birth year] who lack laboratory evidence of measles, mumps, or rubella immunity or laboratory confirmation of disease should consider measles and mumps immunization with 2 doses of MMR vaccine or rubella immunization with 1 dose of MMR vaccine.  Pneumococcal 13-valent conjugate (PCV13) vaccine. When indicated, a person who is uncertain of his immunization history and has no record of immunization should receive the PCV13 vaccine. All adults 68 years of age and older should receive this vaccine. An adult aged 47 years or older who has certain medical conditions and has not been previously immunized should receive 1 dose of PCV13 vaccine. This PCV13 should be followed with a dose  of pneumococcal polysaccharide (PPSV23) vaccine. Adults who are  at high risk for pneumococcal disease should obtain the PPSV23 vaccine at least 8 weeks after the dose of PCV13 vaccine. Adults older than 27 years of age who have normal immune system function should obtain the PPSV23 vaccine dose at least 1 year after the dose of PCV13 vaccine.  Pneumococcal polysaccharide (PPSV23) vaccine. When PCV13 is also indicated, PCV13 should be obtained first. All adults aged 12 years and older should be immunized. An adult younger than age 26 years who has certain medical conditions should be immunized. Any person who resides in a nursing home or long-term care facility should be immunized. An adult smoker should be immunized. People with an immunocompromised condition and certain other conditions should receive both PCV13 and PPSV23 vaccines. People with human immunodeficiency virus (HIV) infection should be immunized as soon as possible after diagnosis. Immunization during chemotherapy or radiation therapy should be avoided. Routine use of PPSV23 vaccine is not recommended for American Indians, [place] Natives, or people younger than 65 years unless there are medical conditions that require PPSV23 vaccine. When indicated, people who have unknown immunization and have no record of immunization should receive PPSV23 vaccine. One-time revaccination 5 years after the first dose of PPSV23 is recommended for people aged 19-64 years who have chronic kidney failure, nephrotic syndrome, asplenia, or immunocompromised conditions. People who received 1-2 doses of PPSV23 before age 66 years should receive another dose of PPSV23 vaccine at age 77 years or later if at least 5 years have passed since the previous dose. Doses of PPSV23 are not needed for people immunized with PPSV23 at or after age 81 years.  Meningococcal vaccine. Adults with asplenia or persistent complement component deficiencies should receive 2 doses of  quadrivalent meningococcal conjugate (MenACWY-D) vaccine. The doses should be obtained at least 2 months apart. Microbiologists working with certain meningococcal bacteria, [employer] recruits, people at risk during an outbreak, and people who travel to or live in countries with a high rate of meningitis should be immunized. A first-year college student up through age 36 years who is living in a residence hall should receive a dose if she did not receive a dose on or after her 16th birthday. Adults who have certain high-risk conditions should receive one or more doses of vaccine.  Hepatitis A vaccine. Adults who wish to be protected from this disease, have certain high-risk conditions, work with hepatitis A-infected animals, work in hepatitis A research labs, or travel to or work in countries with a high rate of hepatitis A should be immunized. Adults who were previously unvaccinated and who anticipate close contact with an international adoptee during the first 60 days after arrival in the [place] from a country with a high rate of hepatitis A should be immunized.  Hepatitis B vaccine. Adults who wish to be protected from this disease, have certain high-risk conditions, may be exposed to blood or other infectious body fluids, are household contacts or sex partners of hepatitis B positive people, are clients or workers in certain care facilities, or travel to or work in countries with a high rate of hepatitis B should be immunized.  Haemophilus influenzae type b (Hib) vaccine. A previously unvaccinated person with asplenia or sickle cell disease or having a scheduled splenectomy should receive 1 dose of Hib vaccine. Regardless of previous immunization, a recipient of a hematopoietic stem cell transplant should receive a 3-dose series 6-12 months after her successful transplant. Hib vaccine is not recommended for adults with HIV infection. Preventive Services / Frequency  Ages 63 to 39 years  Blood  pressure check.** / Every 3-5 years.  Lipid and cholesterol check.** / Every 5 years beginning at age 65.  Clinical breast exam.** / Every 3 years for women in their 11s and 67s.  BRCA-related cancer risk assessment.** / For women who have family members with a BRCA-related cancer (breast, ovarian, tubal, or peritoneal cancers).  Pap test.** / Every 2 years from ages 16 through 62. Every 3 years starting at age 65 through age 35 or 60 with a history of 3 consecutive normal Pap tests.  HPV screening.** / Every 3 years from ages 65 through ages 32 to 2 with a history of 3 consecutive normal Pap tests.  Hepatitis C blood test.** / For any individual with known risks for hepatitis C.  Skin self-exam. / Monthly.  Influenza vaccine. / Every year.  Tetanus, diphtheria, and acellular pertussis (Tdap, Td) vaccine.** / Consult your health care provider. Pregnant women should receive 1 dose of Tdap vaccine during each pregnancy. 1 dose of Td every 10 years.  Varicella vaccine.** / Consult your health care provider. Pregnant females who do not have evidence of immunity should receive the first dose after pregnancy.  HPV vaccine. / 3 doses over 6 months, if 21 and younger. The vaccine is not recommended for use in pregnant females. However, pregnancy testing is not needed before receiving a dose.  Measles, mumps, rubella (MMR) vaccine.** / You need at least 1 dose of MMR if you were born in 1957 or later. You may also need a 2nd dose. For females of childbearing age, rubella immunity should be determined. If there is no evidence of immunity, females who are not pregnant should be vaccinated. If there is no evidence of immunity, females who are pregnant should delay immunization until after pregnancy.  Pneumococcal 13-valent conjugate (PCV13) vaccine.** / Consult your health care provider.  Pneumococcal polysaccharide (PPSV23) vaccine.** / 1 to 2 doses if you smoke cigarettes or if you have certain  conditions.  Meningococcal vaccine.** / 1 dose if you are age 56 to 20 years and a Market researcher living in a residence hall, or have one of several medical conditions, you need to get vaccinated against meningococcal disease. You may also need additional booster doses.  Hepatitis A vaccine.** / Consult your health care provider.  Hepatitis B vaccine.** / Consult your health care provider.  Haemophilus influenzae type b (Hib) vaccine.** / Consult your health care provider. Ages 74 to 44 years  Blood pressure check.** / Every year.  Lipid and cholesterol check.** / Every 5 years beginning at age 65 years.  Lung cancer screening. / Every year if you are aged 45-80 years and have a 30-pack-year history of smoking and currently smoke or have quit within the past 15 years. Yearly screening is stopped once you have quit smoking for at least 15 years or develop a health problem that would prevent you from having lung cancer treatment.  Clinical breast exam.** / Every year after age 63 years.  BRCA-related cancer risk assessment.** / For women who have family members with a BRCA-related cancer (breast, ovarian, tubal, or peritoneal cancers).  Mammogram.** / Every year beginning at age 23 years and continuing for as long as you are in good health. Consult with your health care provider.  Pap test.** / Every 3 years starting at age 28 years through age 40 or 6 years with a history of 3 consecutive normal Pap tests.  HPV screening.** / Every  3 years from ages 14 years through ages 35 to 5 years with a history of 3 consecutive normal Pap tests.  Fecal occult blood test (FOBT) of stool. / Every year beginning at age 29 years and continuing until age 67 years. You may not need to do this test if you get a colonoscopy every 10 years.  Flexible sigmoidoscopy or colonoscopy.** / Every 5 years for a flexible sigmoidoscopy or every 10 years for a colonoscopy beginning at age 54 years and  continuing until age 92 years.  Hepatitis C blood test.** / For all people born from [birth year] through 1967 and any individual with known risks for hepatitis C.  Skin self-exam. / Monthly.  Influenza vaccine. / Every year.  Tetanus, diphtheria, and acellular pertussis (Tdap/Td) vaccine.** / Consult your health care provider. Pregnant women should receive 1 dose of Tdap vaccine during each pregnancy. 1 dose of Td every 10 years.  Varicella vaccine.** / Consult your health care provider. Pregnant females who do not have evidence of immunity should receive the first dose after pregnancy.  Zoster vaccine.** / 1 dose for adults aged 72 years or older.  Measles, mumps, rubella (MMR) vaccine.** / You need at least 1 dose of MMR if you were born in 1957 or later. You may also need a second dose. For females of childbearing age, rubella immunity should be determined. If there is no evidence of immunity, females who are not pregnant should be vaccinated. If there is no evidence of immunity, females who are pregnant should delay immunization until after pregnancy.  Pneumococcal 13-valent conjugate (PCV13) vaccine.** / Consult your health care provider.  Pneumococcal polysaccharide (PPSV23) vaccine.** / 1 to 2 doses if you smoke cigarettes or if you have certain conditions.  Meningococcal vaccine.** / Consult your health care provider.  Hepatitis A vaccine.** / Consult your health care provider.  Hepatitis B vaccine.** / Consult your health care provider.  Haemophilus influenzae type b (Hib) vaccine.** / Consult your health care provider. Ages 45 years and over  Blood pressure check.** / Every year.  Lipid and cholesterol check.** / Every 5 years beginning at age 77 years.  Lung cancer screening. / Every year if you are aged 57-80 years and have a 30-pack-year history of smoking and currently smoke or have quit within the past 15 years. Yearly screening is stopped once you have quit smoking for at  least 15 years or develop a health problem that would prevent you from having lung cancer treatment.  Clinical breast exam.** / Every year after age 68 years.  BRCA-related cancer risk assessment.** / For women who have family members with a BRCA-related cancer (breast, ovarian, tubal, or peritoneal cancers).  Mammogram.** / Every year beginning at age 25 years and continuing for as long as you are in good health. Consult with your health care provider.  Pap test.** / Every 3 years starting at age 79 years through age 75 or 72 years with 3 consecutive normal Pap tests. Testing can be stopped between 65 and 70 years with 3 consecutive normal Pap tests and no abnormal Pap or HPV tests in the past 10 years.  HPV screening.** / Every 3 years from ages 34 years through ages 36 or 59 years with a history of 3 consecutive normal Pap tests. Testing can be stopped between 65 and 70 years with 3 consecutive normal Pap tests and no abnormal Pap or HPV tests in the past 10 years.  Fecal occult blood test (FOBT) of stool. /  Every year beginning at age 4 years and continuing until age 84 years. You may not need to do this test if you get a colonoscopy every 10 years.  Flexible sigmoidoscopy or colonoscopy.** / Every 5 years for a flexible sigmoidoscopy or every 10 years for a colonoscopy beginning at age 58 years and continuing until age 5 years.  Hepatitis C blood test.** / For all people born from [birth year] through 1967 and any individual with known risks for hepatitis C.  Osteoporosis screening.** / A one-time screening for women ages 35 years and over and women at risk for fractures or osteoporosis.  Skin self-exam. / Monthly.  Influenza vaccine. / Every year.  Tetanus, diphtheria, and acellular pertussis (Tdap/Td) vaccine.** / 1 dose of Td every 10 years.  Varicella vaccine.** / Consult your health care provider.  Zoster vaccine.** / 1 dose for adults aged 20 years or older.  Pneumococcal  13-valent conjugate (PCV13) vaccine.** / Consult your health care provider.  Pneumococcal polysaccharide (PPSV23) vaccine.** / 1 dose for all adults aged 85 years and older.  Meningococcal vaccine.** / Consult your health care provider.  Hepatitis A vaccine.** / Consult your health care provider.  Hepatitis B vaccine.** / Consult your health care provider.  Haemophilus influenzae type b (Hib) vaccine.** / Consult your health care provider. ** Family history and personal history of risk and conditions may change your health care provider's recommendations.   This information is not intended to replace advice given to you by your health care provider. Make sure you discuss any questions you have with your health care provider.   Document Released: 02/15/2001 Document Revised: 01/10/2014 Document Reviewed: 05/17/2010 Elsevier Interactive Patient Education [employer].

## 2015-07-21 NOTE — Progress Notes (Signed)
GYNECOLOGY CLINIC ANNUAL PREVENTATIVE CARE ENCOUNTER NOTE  Subjective:   Diana Jacobs is a 25 y.o. G1P1011 female here for a routine annual gynecologic exam.  Current complaints: none.   Denies abnormal vaginal bleeding, discharge, pelvic pain, problems with intercourse or other gynecologic concerns.    Gynecologic History Patient's last menstrual period was 06/08/2015. Contraception: Nexplanon placed in 2016 Had ASCUS +HRHPV pap smears in 04/17/2013, 10/22/2013 and 06/25/2014. Benign colposcopy in 2015.  Obstetric History OB History  Gravida Para Term Preterm AB SAB TAB Ectopic Multiple Living  # Outcome Date GA Lbr Len/2nd Weight Sex Delivery Anes PTL Lv  3 Gravida           2 Term 02/28/13 [redacted]w[redacted]d / 01:22 8 lb 4.8 oz (3.765 kg) M Vag-Spont EPI  Y  1 SAB 02/02/11             Comments: 7wks      Past Medical History  Diagnosis Date  . Herpes     Reports being told she had herpes, no lesions  . ASCUS with positive high risk HPV cervical pap smears     04/17/2013, 10/22/2013 and 06/25/2014. Benign colposcopy in 2015.    Past Surgical History  Procedure Laterality Date  . Eye surgery    . Tooth extraction    . Eye surgery      No current outpatient prescriptions on file prior to visit.   No current facility-administered medications on file prior to visit.    No Known Allergies  Social History   Social History  . Marital Status: Single    Spouse Name: N/A  . Number of Children: N/A  . Years of Education: N/A   Occupational History  . Not on file.   Social History Main Topics  . Smoking status: Never Smoker   . Smokeless tobacco: Never Used  . Alcohol Use: No  . Drug Use: No  . Sexual Activity: Yes    Birth Control/ Protection: None   Other Topics Concern  . Not on file   Social History Narrative    Family History  Problem Relation Age of Onset  . Hypertension Neg Hx   . Diabetes Neg Hx     The following portions of the  patient's history were reviewed and updated as appropriate: allergies, current medications, past family history, past medical history, past social history, past surgical history and problem list.  Review of Systems Pertinent items noted in HPI and remainder of comprehensive ROS otherwise negative.   Objective:  Ht 5' 4.5" (1.638 m)  Wt 236 lb (107.049 kg)  BMI 39.90 kg/m2  LMP 06/08/2015 CONSTITUTIONAL: Well-developed, well-nourished female in no acute distress.  HENT:  Normocephalic, atraumatic, External right and left ear normal. Oropharynx is clear and moist EYES: Conjunctivae and EOM are normal. Pupils are equal, round, and reactive to light. No scleral icterus.  NECK: Normal range of motion, supple, no masses.  Normal thyroid.  SKIN: Skin is warm and dry. No rash noted. Not diaphoretic. No erythema. No pallor. NEUROLOGIC: Alert and oriented to person, place, and time. Normal reflexes, muscle tone coordination. No cranial nerve deficit noted. PSYCHIATRIC: Normal mood and affect. Normal behavior. Normal judgment and thought content. CARDIOVASCULAR: Normal heart rate noted, regular rhythm RESPIRATORY: Clear to auscultation bilaterally. Effort and breath sounds normal, no problems with respiration noted. BREASTS: Symmetric in size. No masses, skin changes, nipple drainage, or lymphadenopathy.  ABDOMEN: Soft, obese, normal bowel sounds, no distention appreciated.  No tenderness, rebound or guarding.  PELVIC: Normal appearing external genitalia; normal appearing vaginal mucosa and cervix.  Normal appearing discharge.  Pap smear obtained.  Difficult speculum exam, had to use long Pederson to visualize cervix.  Unable to palpate uterus or adnexa secondary to habitus.  MUSCULOSKELETAL: Normal range of motion. No tenderness.  No cyanosis, clubbing, or edema.  2+ distal pulses.   Assessment:  Annual gynecologic examination with pap smear Cervical dysplasia   Plan:  Will follow up results of  pap smear and manage accordingly. If still ASCUS, will consider cryotherapy given persistence of cervical dysplasia. Routine preventative health maintenance measures emphasized. Please refer to After Visit Summary for other counseling recommendations.    Diana Jacobs  Diana Rhyner, MD, FACOG Attending Obstetrician & Gynecologist, [facility]

## 2015-07-23 LAB — IGP, APTIMA HPV, RFX 16/18,45
HPV Aptima: POSITIVE — AB
PAP Smear Comment: 0

## 2015-07-25 ENCOUNTER — Encounter: Payer: Self-pay | Admitting: Obstetrics & Gynecology

## 2015-07-25 NOTE — Progress Notes (Signed)
Quick Note:  Please schedule patient for colposcopy for LGSIL, positive HRHPV pap done on 07/21/2015. Please call to inform patient of results and need for appointment. Needs to be scheduled at [facility].  Tereso Newcomer, MD   ______

## 2015-07-28 ENCOUNTER — Ambulatory Visit: Payer: Medicaid Other

## 2015-07-28 DIAGNOSIS — Z3202 Encounter for pregnancy test, result negative: Secondary | ICD-10-CM

## 2015-07-28 LAB — POCT URINALYSIS DIP (DEVICE)
Bilirubin Urine: NEGATIVE
Glucose, UA: NEGATIVE mg/dL
Ketones, ur: NEGATIVE mg/dL
NITRITE: POSITIVE — AB
PROTEIN: NEGATIVE mg/dL
Urobilinogen, UA: 0.2 mg/dL (ref 0.0–1.0)
pH: 6 (ref 5.0–8.0)

## 2015-07-28 LAB — POCT PREGNANCY, URINE: PREG TEST UR: NEGATIVE

## 2015-07-28 NOTE — Progress Notes (Signed)
Patient presented to office today for a pregnancy test. Patient test was negative for pregnancy at this time. Patient also has a nexplanon that was placed in 2016. I explained to patient she is not pregnant at this time. Patient stated she was having some frequent urinary pain. I advised patient she may have a UTI and I would send it off for a culture to see if it grows any bacteria. Patient verbalizes understanding and I will follow up once the results get back. Patient rapid urine test does show positive nitrate at this time and large amount of Leuk. Since she is having symptoms at this time. I will call in a medication to help with discomfort per protocol.

## 2015-07-29 ENCOUNTER — Emergency Department (HOSPITAL_COMMUNITY)
Admission: EM | Admit: 2015-07-29 | Discharge: 2015-07-29 | Disposition: A | Discharge status: Home / Self Care | Payer: Medicaid Other | Attending: Emergency Medicine | Admitting: Emergency Medicine

## 2015-07-29 ENCOUNTER — Encounter (HOSPITAL_COMMUNITY): Payer: Self-pay

## 2015-07-29 DIAGNOSIS — R109 Unspecified abdominal pain: Secondary | ICD-10-CM | POA: Diagnosis present

## 2015-07-29 DIAGNOSIS — R300 Dysuria: Secondary | ICD-10-CM

## 2015-07-29 DIAGNOSIS — N390 Urinary tract infection, site not specified: Secondary | ICD-10-CM

## 2015-07-29 MED ORDER — SULFAMETHOXAZOLE-TRIMETHOPRIM 800-160 MG PO TABS: 1.0000 | ORAL_TABLET | Freq: Two times a day (BID) | ORAL | 0 refills | Status: AC

## 2015-07-29 MED ORDER — PHENAZOPYRIDINE HCL 100 MG PO TABS
100.0000 mg | ORAL_TABLET | Freq: Three times a day (TID) | ORAL | Status: DC
  Administered 2015-07-29: 100 mg via ORAL
  Filled 2015-07-29: qty 1

## 2015-07-29 MED ORDER — PHENAZOPYRIDINE HCL 200 MG PO TABS: 200.0000 mg | ORAL_TABLET | Freq: Three times a day (TID) | ORAL | 0 refills | Status: DC

## 2015-07-29 MED ORDER — KETOROLAC TROMETHAMINE 30 MG/ML IJ SOLN
30.0000 mg | Freq: Once | INTRAMUSCULAR | Status: AC
  Administered 2015-07-29: 30 mg via INTRAMUSCULAR
  Filled 2015-07-29: qty 1

## 2015-07-29 MED ORDER — SULFAMETHOXAZOLE-TRIMETHOPRIM 800-160 MG PO TABS
1.0000 | ORAL_TABLET | Freq: Once | ORAL | Status: AC
  Administered 2015-07-29: 1 via ORAL
  Filled 2015-07-29: qty 1

## 2015-07-29 NOTE — ED Triage Notes (Signed)
Pt complains of bladder contractions right after urination

## 2015-07-29 NOTE — ED Notes (Signed)
PA at bedside.

## 2015-07-29 NOTE — Discharge Instructions (Signed)
Mrs. take the medication as directed.  You've been given 2 prescriptions, one for the urinary tract infection itself.  The other is for the discomfort.  Make sure to take the antibiotic until all tablets have been completed.  I would use the Pyridium 1 for discomfort for the next 2 days and then as needed thereafter.  Please make an appointment with your primary care physician for follow-up

## 2015-07-29 NOTE — ED Provider Notes (Signed)
[facility]-EMERGENCY DEPT Provider Note   CSN: [id number] Arrival date & time: 07/29/15  7829  First Provider Contact:  First MD Initiated Contact with Patient 07/29/15 0248        History   Chief Complaint Chief Complaint  Patient presents with  . Abdominal Pain    HPI Diana Jacobs is a 25 y.o. female.  This is 25 year old female who was diagnosed with UTI at an outside clinic yesterday.  They supposedly called in a prescription for her which she has not picked up yet.  She presents to the emergency department tonight complaining of spasms after urinating, frequency and low back pain.  Denies any fever, vomiting, diarrhea, vaginal discharge. She did recently have a tonight GYN evaluation and had a small squamous intraparenchymal lesion removed      Past Medical History:  Diagnosis Date  . 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 Benign colposcopy in 2015.  Marland Kitchen Herpes    Reports being told she had herpes, no lesions    Patient Active Problem List   Diagnosis Date Noted  . 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 Low grade squamous intraepithelial lesion (LGSIL) on cervical Pap smear 07/21/2015    Past Surgical History:  Procedure Laterality Date  . EYE SURGERY    . EYE SURGERY    . TOOTH EXTRACTION      OB History    Gravida Para Term Preterm AB Living   SAB TAB Ectopic Multiple Live Births   1               Home Medications    Prior to Admission medications   Medication Sig Start Date End Date Taking? Authorizing Provider  phenazopyridine (PYRIDIUM) 200 MG tablet Take 1 tablet (200 mg total) by mouth 3 (three) times daily. 07/29/15   Earley Favor, NP  sulfamethoxazole-trimethoprim (BACTRIM DS,SEPTRA DS) 800-160 MG tablet Take 1 tablet by mouth 2 (two) times daily. 07/29/15 08/05/15  Earley Favor, NP    Family History Family History  Problem Relation Age of Onset    . Hypertension Neg Hx   . Diabetes Neg Hx     Social History Social History  Substance Use Topics  . Smoking status: Never Smoker  . Smokeless tobacco: Never Used  . Alcohol use No     Allergies   Review of patient's allergies indicates no known allergies.   Review of Systems Review of Systems  Constitutional: Negative for fever.  Gastrointestinal: Positive for abdominal pain. Negative for constipation, diarrhea, nausea and vomiting.  Genitourinary: Positive for dysuria and frequency.  All other systems reviewed and are negative.    Physical Exam Updated Vital Signs BP 133/80   Pulse 96   Temp 100.3 F (37.9 C) (Oral)   Resp 20   Ht 5' 4.5" (1.638 m)   Wt 107 kg   LMP 06/08/2015   SpO2 99%   BMI 39.88 kg/m   Physical Exam  Constitutional: She appears well-developed and well-nourished.  Eyes: Pupils are equal, round, and reactive to light.  Neck: Normal range of motion.  Cardiovascular: Normal rate.   Pulmonary/Chest: Effort normal.  Abdominal: Soft. She exhibits no distension. There is no tenderness.  Genitourinary:  Genitourinary Comments: Pelvic exam deferred as she was seen in the clinic 2 days ago for follow-up from her procedure  Musculoskeletal: Normal range of motion.  Neurological: She is alert.  Skin: Skin is warm.  Psychiatric: She has a normal mood and affect.  Nursing note and vitals reviewed.    ED Treatments / Results  Labs (all labs ordered are listed, but only abnormal results are displayed) Labs Reviewed - No data to display  EKG  EKG Interpretation None       Radiology No results found.  Procedures Procedures (including critical care time)  Medications Ordered in ED Medications  phenazopyridine (PYRIDIUM) tablet 100 mg (100 mg Oral Given 07/29/15 0305)  sulfamethoxazole-trimethoprim (BACTRIM DS,SEPTRA DS) 800-160 MG per tablet 1 tablet (1 tablet Oral Given 07/29/15 0305)  ketorolac (TORADOL) 30 MG/ML injection 30 mg (30  mg Intramuscular Given 07/29/15 0305)     Initial Impression / Assessment and Plan / ED Course  I have reviewed the triage vital signs and the nursing notes.  Pertinent labs & imaging results that were available during my care of the patient were reviewed by me and considered in my medical decision making (see chart for details).  Clinical Course     Patient was given IM Toradol by mouth Septra and by mouth Pyridium and is feeling significantly better.  She will be given prescriptions for Septra and Pyridium and allow to follow-up with her primary care physician  Final Clinical Impressions(s) / ED Diagnoses   Final diagnoses:  UTI (lower urinary tract infection)  Dysuria    New Prescriptions New Prescriptions   PHENAZOPYRIDINE (PYRIDIUM) 200 MG TABLET    Take 1 tablet (200 mg total) by mouth 3 (three) times daily.   SULFAMETHOXAZOLE-TRIMETHOPRIM (BACTRIM DS,SEPTRA DS) 800-160 MG TABLET    Take 1 tablet by mouth 2 (two) times daily.     Earley Favor, NP 07/29/15 0425    Dione Booze, MD 07/29/15 [phone]

## 2015-08-19 ENCOUNTER — Encounter: Payer: Self-pay | Admitting: Obstetrics and Gynecology

## 2015-09-09 ENCOUNTER — Other Ambulatory Visit: Payer: Self-pay | Admitting: Obstetrics and Gynecology

## 2015-09-09 ENCOUNTER — Ambulatory Visit (INDEPENDENT_AMBULATORY_CARE_PROVIDER_SITE_OTHER): Payer: Medicaid Other | Admitting: Obstetrics and Gynecology

## 2015-09-09 VITALS — BP 128/76 | HR 73 | Temp 98.6°F | Wt 244.0 lb

## 2015-09-09 DIAGNOSIS — Z3202 Encounter for pregnancy test, result negative: Secondary | ICD-10-CM

## 2015-09-09 DIAGNOSIS — R87612 Low grade squamous intraepithelial lesion on cytologic smear of cervix (LGSIL): Secondary | ICD-10-CM

## 2015-09-09 LAB — POCT URINE PREGNANCY: PREG TEST UR: NEGATIVE

## 2015-09-09 NOTE — Progress Notes (Signed)
Patient ID: Diana Jacobs, female   DOB: [birth year], 25 y.o.   MRN: [id number]  Chief Complaint  Patient presents with  . Colposcopy    HPI Diana Jacobs is a 25 y.o. female who presents for colonoscopy  HPI  Indications: Pap smear on July 2017 showed: low-grade squamous intraepithelial neoplasia (LGSIL - encompassing HPV,mild dysplasia,CIN I). Previous colposcopy: HPV related changes and in 2015. Prior cervical treatment: no treatment.  Past Medical History:  Diagnosis Date  . 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 Benign colposcopy in 2015.  Marland Kitchen. Herpes    Reports being told she had herpes, no lesions    Past Surgical History:  Procedure Laterality Date  . EYE SURGERY    . EYE SURGERY    . TOOTH EXTRACTION      Family History  Problem Relation Age of Onset  . Hypertension Neg Hx   . Diabetes Neg Hx     Social History Social History  Substance Use Topics  . Smoking status: Never Smoker  . Smokeless tobacco: Never Used  . Alcohol use No    No Known Allergies  Current Outpatient Prescriptions  Medication Sig Dispense Refill  . phenazopyridine (PYRIDIUM) 200 MG tablet Take 1 tablet (200 mg total) by mouth 3 (three) times daily. (Patient not taking: Reported on 09/09/2015) 10 tablet 0   No current facility-administered medications for this visit.     Review of Systems Review of Systems  Blood pressure 128/76, pulse 73, temperature 98.6 F (37 C), temperature source Oral, weight 244 lb (110.7 kg), last menstrual period 07/31/2015, unknown if currently breastfeeding.  Physical Exam Physical Exam  Data Reviewed   Assessment    Procedure Details  The risks and benefits of the procedure and Written informed consent obtained.  Speculum placed in vagina and excellent visualization of cervix achieved, cervix swabbed x 3 with acetic acid solution. Transformation zone was seen. No visible cervical  abnormalities were noted. HPV changes were seen. Bx and ECC obtained. Monsels applied. Pt tolerated well  Specimens: ECC and cervical Bx at 12 and 6 o'clock.   Complications: none.     Plan    Specimens labelled and sent to Pathology. Pt will be contacted with Bx results and follow up as indicated by the results.      Hermina StaggersMichael L Fallen Crisostomo 09/09/2015, 9:17 AM

## 2015-09-09 NOTE — Patient Instructions (Signed)
COLPOSCOPY POST-PROCEDURE INSTRUCTIONS  1. You may take Ibuprofen, Aleve or Tylenol for cramping if needed.  2. If Monsel's solution was used, you will have a black discharge.  3. Light bleeding is normal.  If bleeding is heavier than your period, please call.  4. Put nothing in your vagina until the bleeding or discharge stops (usually 2 or 3 days).  5. We will call you within one week with biopsy results or discuss the results at your follow-up appointment if needed.  

## 2015-09-09 NOTE — Addendum Note (Signed)
Addended by: Francene FindersJAMES, QUINETTA C on: 09/09/2015 09:43 AM   Modules accepted: Orders

## 2015-09-29 ENCOUNTER — Encounter: Payer: Self-pay | Admitting: *Deleted

## 2015-10-06 ENCOUNTER — Telehealth: Payer: Self-pay | Admitting: *Deleted

## 2015-10-06 NOTE — Telephone Encounter (Signed)
Patient is calling for her results from the [facility]. Please let her know what the follow up is.

## 2015-10-07 NOTE — Telephone Encounter (Signed)
Per provider- patient is good for 1 year

## 2015-10-07 NOTE — Telephone Encounter (Signed)
Patient notified

## 2016-03-04 ENCOUNTER — Emergency Department (HOSPITAL_COMMUNITY)
Admission: EM | Admit: 2016-03-04 | Discharge: 2016-03-04 | Disposition: A | Discharge status: Home / Self Care | Payer: Medicaid Other | Attending: Emergency Medicine | Admitting: Emergency Medicine

## 2016-03-04 ENCOUNTER — Encounter (HOSPITAL_COMMUNITY): Payer: Self-pay | Admitting: Emergency Medicine

## 2016-03-04 DIAGNOSIS — G43909 Migraine, unspecified, not intractable, without status migrainosus: Secondary | ICD-10-CM | POA: Diagnosis not present

## 2016-03-04 MED ORDER — DIPHENHYDRAMINE HCL 50 MG/ML IJ SOLN
12.5000 mg | Freq: Once | INTRAMUSCULAR | Status: AC
  Administered 2016-03-04: 12.5 mg via INTRAVENOUS
  Filled 2016-03-04: qty 1

## 2016-03-04 MED ORDER — METOCLOPRAMIDE HCL 5 MG/ML IJ SOLN
10.0000 mg | Freq: Once | INTRAMUSCULAR | Status: AC
  Administered 2016-03-04: 10 mg via INTRAVENOUS
  Filled 2016-03-04: qty 2

## 2016-03-04 MED ORDER — KETOROLAC TROMETHAMINE 30 MG/ML IJ SOLN
30.0000 mg | Freq: Once | INTRAMUSCULAR | Status: AC
  Administered 2016-03-04: 30 mg via INTRAVENOUS
  Filled 2016-03-04: qty 1

## 2016-03-04 MED ORDER — SODIUM CHLORIDE 0.9 % IV BOLUS (SEPSIS)
1000.0000 mL | Freq: Once | INTRAVENOUS | Status: AC
  Administered 2016-03-04: 1000 mL via INTRAVENOUS

## 2016-03-04 NOTE — ED Provider Notes (Signed)
[facility]-EMERGENCY DEPT Provider Note   CSN: [id number] Arrival date & time: 03/04/16  1226  By signing my name below, I, Diana Jacobs, attest that this documentation has been prepared under the direction and in the presence of [facility], PA-C. Electronically Signed: Sonum Jacobs, Neurosurgeon. 03/04/16. 1:13 PM.  History   Chief Complaint Chief Complaint  Patient presents with  . Migraine   The history is provided by the patient. No language interpreter was used.     HPI Comments: Diana Jacobs is a 26 y.o. female who presents to the Emergency Department complaining of constant, waxing and waning left sided HA that began 2 days ago. She reports associated  left ear ringing and photophobia. She states the pain is worse with lights, noise, and movement.  She reports a history of similar symptoms with prior headaches. Tylenol taken prior to arrival with no relief. She denies dizziness, numbness, paresthesia.   Past Medical History:  Diagnosis Date  . 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 Benign colposcopy in 2015.  Marland Kitchen Herpes    Reports being told she had herpes, no lesions    Patient Active Problem List   Diagnosis Date Noted  . Low grade squamous intraepithelial lesion (LGSIL) on cervical Pap smear 07/21/2015    Past Surgical History:  Procedure Laterality Date  . EYE SURGERY    . EYE SURGERY    . TOOTH EXTRACTION      OB History    Gravida Para Term Preterm AB Living   3 1 1   1 1    SAB TAB Ectopic Multiple Live Births   1       1       Home Medications    Prior to Admission medications   Not on File    Family History Family History  Problem Relation Age of Onset  . Hypertension Neg Hx   . Diabetes Neg Hx     Social History Social History  Substance Use Topics  . Smoking status: Never Smoker  . Smokeless tobacco: Never Used  . Alcohol use No     Allergies   Patient has no known  allergies.   Review of Systems Review of Systems  HENT: Positive for ear pain.   Eyes: Positive for photophobia.  Neurological: Positive for headaches. Negative for dizziness, syncope, weakness and numbness.  All other systems reviewed and are negative.    Physical Exam Updated Vital Signs BP (!) 161/103 (BP Location: Left Arm)   Pulse 71   Temp 98.1 F (36.7 C) (Oral)   Resp 18   SpO2 97%   Physical Exam  Constitutional: She is oriented to person, place, and time. She appears well-developed and well-nourished. No distress.  HENT:  Head: Normocephalic and atraumatic.  Cardiovascular: Normal rate, regular rhythm and normal heart sounds.   No murmur heard. Pulmonary/Chest: Effort normal and breath sounds normal. No respiratory distress.  Abdominal: Soft. She exhibits no distension. There is no tenderness.  Musculoskeletal: Normal range of motion.  Neurological: She is alert and oriented to person, place, and time.  Alert, oriented, thought content appropriate, able to give a coherent history. Speech is clear and goal oriented, able to follow commands.  Cranial Nerves:  II:  Peripheral visual fields grossly normal, pupils equal, round, reactive to light III, IV, VI: EOM intact bilaterally, ptosis not present V,VII: smile symmetric, eyes kept closed tightly against resistance, facial light touch sensation equal  VIII: hearing grossly normal IX, X: symmetric soft palate movement, uvula elevates symmetrically  XI: bilateral shoulder shrug symmetric and strong XII: midline tongue extension 5/5 muscle strength in upper and lower extremities bilaterally including strong and equal grip strength and dorsiflexion/plantar flexion Sensory to light touch normal in all four extremities.  Normal finger-to-nose and rapid alternating movements; normal gait and balance. No drift.   Skin: Skin is warm and dry.  Nursing note and vitals reviewed.    ED Treatments / Results  DIAGNOSTIC  STUDIES: Oxygen Saturation is 97% on RA, normal by my interpretation.    COORDINATION OF CARE: 1:13 PM Discussed treatment plan with pt at bedside and pt agreed to plan.   Labs (all labs ordered are listed, but only abnormal results are displayed) Labs Reviewed - No data to display  EKG  EKG Interpretation None       Radiology No results found.  Procedures Procedures (including critical care time)  Medications Ordered in ED Medications - No data to display   Initial Impression / Assessment and Plan / ED Course  I have reviewed the triage vital signs and the nursing notes.  Pertinent labs & imaging results that were available during my care of the patient were reviewed by me and considered in my medical decision making (see chart for details).    Diana Jacobs presents to ED for headache. No focal neuro deficits on exam.   Therapeutics: Toradol, Reglan, Benadryl, 1L IVF.   The patient denies any neurologic symptoms such as visual changes, focal numbness/weakness, balance problems, confusion, or speech difficulty to suggest a life-threatening intracranial process such as intracranial hemorrhage or mass. The patient has no clotting risk factors thus venous sinus thrombosis is unlikely.    Patient re-evaluated and symptoms have resolved. She is requesting discharge to home.  PCP follow up strongly encouraged. I have reviewed return precautions including development of neurologic symptoms, confusion, lethargy, or difficulty speaking and patient has voiced understanding.   Final Clinical Impressions(s) / ED Diagnoses   Final diagnoses:  None    New Prescriptions New Prescriptions   No medications on file   I personally performed the services described in this documentation, which was scribed in my presence. The recorded information has been reviewed and is accurate.    [facility] LPJaime Jacobs Diana Lofaso, PA-C 03/04/16 1436    Azalia BilisKevin Campos, MD 03/04/16 [id number]

## 2016-03-04 NOTE — Discharge Instructions (Signed)
Follow up with your primary care provider for discussion of today's ER visit.  Return to ER for new or worsening symptoms, any additional concerns.

## 2016-03-04 NOTE — ED Triage Notes (Signed)
Per EMS pt complaint of intermittent migraine; pt denies other symptoms "just pain" onset yesterday.

## 2016-05-11 ENCOUNTER — Ambulatory Visit: Payer: Medicaid Other | Admitting: Obstetrics & Gynecology

## 2016-07-27 ENCOUNTER — Ambulatory Visit: Payer: Medicaid Other | Admitting: Obstetrics and Gynecology

## 2016-08-10 ENCOUNTER — Other Ambulatory Visit (HOSPITAL_COMMUNITY)
Admission: RE | Admit: 2016-08-10 | Discharge: 2016-08-10 | Disposition: A | Discharge status: Home / Self Care | Payer: Medicaid Other | Source: Ambulatory Visit | Attending: Obstetrics & Gynecology | Admitting: Obstetrics & Gynecology

## 2016-08-10 ENCOUNTER — Encounter: Payer: Self-pay | Admitting: Obstetrics & Gynecology

## 2016-08-10 ENCOUNTER — Ambulatory Visit (INDEPENDENT_AMBULATORY_CARE_PROVIDER_SITE_OTHER): Payer: Medicaid Other | Admitting: Obstetrics & Gynecology

## 2016-08-10 VITALS — BP 137/85 | HR 84 | Ht 64.5 in | Wt 252.0 lb

## 2016-08-10 DIAGNOSIS — Z113 Encounter for screening for infections with a predominantly sexual mode of transmission: Secondary | ICD-10-CM | POA: Insufficient documentation

## 2016-08-10 DIAGNOSIS — Z01419 Encounter for gynecological examination (general) (routine) without abnormal findings: Secondary | ICD-10-CM

## 2016-08-10 DIAGNOSIS — Z0000 Encounter for general adult medical examination without abnormal findings: Secondary | ICD-10-CM | POA: Diagnosis not present

## 2016-08-10 DIAGNOSIS — B9689 Other specified bacterial agents as the cause of diseases classified elsewhere: Secondary | ICD-10-CM | POA: Diagnosis not present

## 2016-08-10 DIAGNOSIS — N760 Acute vaginitis: Secondary | ICD-10-CM | POA: Diagnosis not present

## 2016-08-10 NOTE — Progress Notes (Signed)
Pt presents for annual, pap, and all std testing.

## 2016-08-11 LAB — CERVICOVAGINAL ANCILLARY ONLY
BACTERIAL VAGINITIS: POSITIVE — AB
CANDIDA VAGINITIS: NEGATIVE
Chlamydia: NEGATIVE
Neisseria Gonorrhea: NEGATIVE
TRICH (WINDOWPATH): NEGATIVE

## 2016-08-11 LAB — CYTOLOGY - PAP
ADEQUACY: ABSENT
Diagnosis: NEGATIVE

## 2016-08-11 NOTE — Progress Notes (Signed)
Patient ID: Diana Jacobs, female   DOB: May 17, 1990, 26 y.o.   MRN: [id number]  Chief Complaint  Patient presents with  . Gynecologic Exam    HPI Diana Jacobs is a 26 y.o. female.  [id number] Patient's last menstrual period was 05/04/2016. Patient needs f/u pap after colposcopy 09/2015 with LSIL HPI  Past Medical History:  Diagnosis Date  . 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 Benign colposcopy in 2015.  Marland Kitchen. Herpes    Reports being told she had herpes, no lesions    Past Surgical History:  Procedure Laterality Date  . EYE SURGERY    . EYE SURGERY    . TOOTH EXTRACTION      Family History  Problem Relation Age of Onset  . Diabetes Mother   . Hypertension Mother   . Diabetes Father     Social History Social History  Substance Use Topics  . Smoking status: Never Smoker  . Smokeless tobacco: Never Used  . Alcohol use No    No Known Allergies  Current Outpatient Prescriptions  Medication Sig Dispense Refill  . etonogestrel (NEXPLANON) 68 MG IMPL implant 1 each by Subdermal route once. Inserted 05/2016     No current facility-administered medications for this visit.     Review of Systems Review of Systems  Constitutional: Negative.   Respiratory: Negative.   Gastrointestinal: Negative.   Genitourinary: Negative.     Blood pressure 137/85, pulse 84, height 5' 4.5" (1.638 m), weight 252 lb (114.3 kg), last menstrual period 05/04/2016, unknown if currently breastfeeding.  Physical Exam Physical Exam  Constitutional: She is oriented to person, place, and time. She appears well-developed. No distress.  Cardiovascular: Normal rate.   Pulmonary/Chest: Effort normal. No respiratory distress.  Abdominal: Soft. There is no tenderness.  Genitourinary: Vagina normal and uterus normal. No vaginal discharge found.  Genitourinary Comments: Pap done, no mass  Neurological: She is alert and oriented to  person, place, and time.  Skin: Skin is warm and dry.  Psychiatric: She has a normal mood and affect. Her behavior is normal.  Vitals reviewed.   Data Reviewed Pap and colposcopy result  Assessment    Well woman exam LSIL 09/2015 for pap f/u    Plan    Yearly exam  Pap and cotesting 1 year       Scheryl DarterJames Stephaney Steven 08/11/2016, 8:49 PM

## 2016-08-13 LAB — RPR: RPR Ser Ql: REACTIVE — AB

## 2016-08-13 LAB — HEPATITIS C ANTIBODY: HEP C VIRUS AB: 0.1 {s_co_ratio} (ref 0.0–0.9)

## 2016-08-13 LAB — HIV ANTIBODY (ROUTINE TESTING W REFLEX): HIV SCREEN 4TH GENERATION: NONREACTIVE

## 2016-08-13 LAB — RPR, QUANT+TP ABS (REFLEX)
Rapid Plasma Reagin, Quant: 1:1 {titer} — ABNORMAL HIGH
TREPONEMA PALLIDUM AB: POSITIVE — AB

## 2016-08-13 LAB — HEPATITIS B SURFACE ANTIGEN: Hepatitis B Surface Ag: NEGATIVE

## 2016-09-12 ENCOUNTER — Telehealth: Payer: Self-pay | Admitting: *Deleted

## 2016-09-12 NOTE — Telephone Encounter (Signed)
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 Low titer, recommend repeat testing, possible treatment

## 2016-09-12 NOTE — Telephone Encounter (Signed)
Spoke with pt today to review RPR results. Pt states that she has been previously dx in 2016 and was treated at the health dept.  Pt made aware provider will be notified and if further treatment/testing needed, she will be contacted.  Pt states understanding.

## 2016-09-23 ENCOUNTER — Emergency Department (HOSPITAL_COMMUNITY)
Admission: EM | Admit: 2016-09-23 | Discharge: 2016-09-23 | Disposition: A | Discharge status: Home / Self Care | Payer: Medicaid Other | Attending: Emergency Medicine | Admitting: Emergency Medicine

## 2016-09-23 ENCOUNTER — Encounter (HOSPITAL_COMMUNITY): Payer: Self-pay

## 2016-09-23 DIAGNOSIS — H6690 Otitis media, unspecified, unspecified ear: Secondary | ICD-10-CM

## 2016-09-23 DIAGNOSIS — H9202 Otalgia, left ear: Secondary | ICD-10-CM | POA: Insufficient documentation

## 2016-09-23 DIAGNOSIS — H6692 Otitis media, unspecified, left ear: Secondary | ICD-10-CM | POA: Diagnosis not present

## 2016-09-23 DIAGNOSIS — Z79899 Other long term (current) drug therapy: Secondary | ICD-10-CM | POA: Diagnosis not present

## 2016-09-23 LAB — RAPID STREP SCREEN (MED CTR MEBANE ONLY): Streptococcus, Group A Screen (Direct): NEGATIVE

## 2016-09-23 MED ORDER — AMOXICILLIN 500 MG PO CAPS
500.0000 mg | ORAL_CAPSULE | Freq: Once | ORAL | Status: AC
  Administered 2016-09-23: 500 mg via ORAL
  Filled 2016-09-23: qty 1

## 2016-09-23 MED ORDER — IBUPROFEN 600 MG PO TABS: 600.0000 mg | ORAL_TABLET | Freq: Four times a day (QID) | ORAL | 0 refills | Status: DC | PRN

## 2016-09-23 MED ORDER — IBUPROFEN 400 MG PO TABS
600.0000 mg | ORAL_TABLET | Freq: Once | ORAL | Status: AC
  Administered 2016-09-23: 12:00:00 600 mg via ORAL
  Filled 2016-09-23: qty 1

## 2016-09-23 MED ORDER — AMOXICILLIN 500 MG PO CAPS: 500.0000 mg | ORAL_CAPSULE | Freq: Two times a day (BID) | ORAL | 0 refills | Status: AC

## 2016-09-23 NOTE — ED Provider Notes (Signed)
[facility] Emergency Department Provider Note  ED Clinical Impression     ICD-10-CM   1. Acute otitis media, unspecified otitis media type H66.90     History   Chief Complaint Ear Fullness   HPI  Patient is a 26 y.o. female who presents to ED for bilateral ear fullness, L>R, states she is also experiencing pain to L ear, onset 5 days ago, describes as dull aching pain, has not tried OTC meds. Denies acute hearing changes. No drainage or bleeding from ear. Also endorses mild sore throat x 5 days; no dysphagia, voice changes, difficulty swallowing, or facial swelling. Denies recent travel or known sick contacts. Denies fevers, chills, unexplained weight loss, dizziness, vertigo, vision or gait changes, headaches, neck pain, facial/tongue/lip swelling, nasal congestion, tinnitus, dysphagia, difficulty handling oral secretions, CP, SOB, cough, abd pain, n/v/d, rash, or any additional concerns.   Past Medical History:  Diagnosis Date  . 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 Benign colposcopy in 2015.  Marland Kitchen Herpes    Reports being told she had herpes, no lesions    Past Surgical History:  Procedure Laterality Date  . EYE SURGERY    . EYE SURGERY    . TOOTH EXTRACTION      Current Outpatient Rx  . Order #: [id number] Class: Print  . Order #: [id number] Class: Historical Med  . Order #: [id number] Class: Print    Allergies Patient has no known allergies.  Family History  Problem Relation Age of Onset  . Diabetes Mother   . Hypertension Mother   . Diabetes Father     Social History Social History  Substance Use Topics  . Smoking status: Never Smoker  . Smokeless tobacco: Never Used  . Alcohol use No    Review of Systems  Constitutional: Negative for fever, chills, or unexplained weight loss. Eyes: Negative for visual changes. ENT: +ear pain, sore throat. Negative for nasal congestion. Cardiovascular: Negative for  chest pain, palpitations, or extremity swelling. Respiratory: Negative for shortness of breath or cough. Gastrointestinal: Negative for abdominal pain, nausea, vomiting, or diarrhea. Musculoskeletal: Negative for back pain or extremity pain. Skin: Negative for rash. Neurological: Negative for headaches, dizziness, focal weakness, or numbness/tingling.  Physical Exam   VITAL SIGNS:   ED Triage Vitals  Enc Vitals Group     BP 09/23/16 0902 135/85     Pulse Rate 09/23/16 0902 83     Resp 09/23/16 0902 16     Temp 09/23/16 0902 99 F (37.2 C)     Temp Source 09/23/16 0902 Oral     SpO2 09/23/16 0902 99 %     Weight 09/23/16 0902 252 lb (114.3 kg)     Height 09/23/16 0902  (1.626 m)     Head Circumference --      Peak Flow --      Pain Score 09/23/16 0906 7     Pain Loc --      Pain Edu? --      Excl. in GC? --     Constitutional: Alert and oriented. Well appearing and in no respiratory apparent distress. Eyes: PERRL, EOMI, Conjunctivae normal ENT      Head: Normocephalic and atraumatic.      Ears: +Left TM buldging and erythematous, L TM intact. R TM intact without erythema or effusion. No hemotympanum, external ear canals normal. No pinnae or tragus tenderness.        Nose: No congestion. No  frontal or maxillary sinus tenderness.       Mouth/Throat: Mucous membranes are moist. Oropharynx without erythema or exudate. No trismus. Normal voice, handling secretions normally.      Neck: Supple, no nuchal signs, full active ROM of neck.  Hematological/Lymphatic/Immunological: No cervical lymphadenopathy. Cardiovascular: Normal S1 S2, regular rhythm, normal rate. Normal and symmetric distal pulses are present in all extremities. Respiratory: Breath sounds clear and equal bilaterally. No wheezes, rales, or rhonchi. Normal respiratory effort.  Gastrointestinal: Abdomen soft and nontender. No rebound or guarding.  Musculoskeletal: Spontaneously moves all extremities.  Neurologic:  Speech clear. Alert and appropriate, no gross focal neurologic deficits are appreciated. Gait steady with ambulation. Equal strength in all four extremities. Extremities neurovascularly intact.  Skin: Skin is warm, dry, and intact. No rash noted. Psychiatric: Mood and affect are normal. Speech and behavior are normal.  Labs   Labs Reviewed  RAPID STREP SCREEN (NOT AT [facility])    Radiology   No orders to display     ED Course, Assessment and Plan   Patient is a 26 y/o F who presents to ED for bilateral ear fullness and left ear pain, exam consistent with left otitis media. Doubt meningitis, mastoiditis, facial or neck abscess, otitis externa, acute sinusitis, dental infection, or trigeminal neuralgia. Will plan to dc with amoxicillin and PCP follow up; patient amenable to this plan.  11:19 AM Discussed results, discharge instructions, rx and safety, return precautions, and follow up with pt. Pt verbalizes understanding using verbal teachback and agrees with plan, denies any additional concerns.   Previous chart, nursing notes, and vital signs reviewed.    Pertinent labs & imaging results that were available during my care of the patient were reviewed by me and considered in my medical decision making (see chart for details).    Sally Menard, Hinton Dyer, NP 09/27/16 1936    Azalia Bilis, MD 09/27/16 2008

## 2016-09-23 NOTE — ED Triage Notes (Signed)
Pt presents to the ed for bilateral ear fullness and pain x 5 days. Denies any other symptoms.

## 2016-09-23 NOTE — Discharge Instructions (Signed)
Please take Amoxicillin (antibiotic) as prescribed for your ear infection--take with food to prevent GI upset. Take Ibuprofen as prescribed as needed for pain--take with food to prevent GI upset. Call to schedule a follow up appointment with your primary care doctor in 3-5 days for follow up. Return to the ER if you experience fevers, chills, unexplained weight loss, sudden or severe headaches, neck pain, hearing changes, facial/tongue/lip swelling, bleeding or drainage from ear, pain with swallowing, voice changes, difficulty handling oral secretions, chest pain, shortness of breath, abdominal pain, nausea, vomiting, diarrhea, extremity numbness/tingling/weakness, worsening symptoms, or any additional concerns.

## 2016-09-26 LAB — CULTURE, GROUP A STREP (THRC)

## 2017-03-05 ENCOUNTER — Emergency Department (HOSPITAL_COMMUNITY)
Admission: EM | Admit: 2017-03-05 | Discharge: 2017-03-05 | Disposition: A | Discharge status: Home / Self Care | Payer: Medicaid Other | Attending: Emergency Medicine | Admitting: Emergency Medicine

## 2017-03-05 ENCOUNTER — Encounter (HOSPITAL_COMMUNITY): Payer: Self-pay | Admitting: Emergency Medicine

## 2017-03-05 DIAGNOSIS — Z79899 Other long term (current) drug therapy: Secondary | ICD-10-CM | POA: Insufficient documentation

## 2017-03-05 DIAGNOSIS — R51 Headache: Secondary | ICD-10-CM | POA: Insufficient documentation

## 2017-03-05 DIAGNOSIS — R519 Headache, unspecified: Secondary | ICD-10-CM

## 2017-03-05 MED ORDER — KETOROLAC TROMETHAMINE 60 MG/2ML IM SOLN: 60.0000 mg | Freq: Once | INTRAMUSCULAR | Status: DC

## 2017-03-05 MED ORDER — OXYCODONE-ACETAMINOPHEN 5-325 MG PO TABS
1.0000 | ORAL_TABLET | ORAL | Status: DC | PRN
  Administered 2017-03-05: 1 via ORAL
  Filled 2017-03-05: qty 1

## 2017-03-05 MED ORDER — DIPHENHYDRAMINE HCL 50 MG/ML IJ SOLN: 25.0000 mg | Freq: Once | INTRAMUSCULAR | Status: DC

## 2017-03-05 MED ORDER — METOCLOPRAMIDE HCL 5 MG/ML IJ SOLN: 10.0000 mg | Freq: Once | INTRAMUSCULAR | Status: DC

## 2017-03-05 MED ORDER — SODIUM CHLORIDE 0.9 % IV BOLUS (SEPSIS): 1000.0000 mL | Freq: Once | INTRAVENOUS | Status: DC

## 2017-03-05 NOTE — Discharge Instructions (Signed)
Recommend excedrin migraine should headache recur. Follow-up with your primary care doctor. Return here for new concerns.

## 2017-03-05 NOTE — ED Provider Notes (Signed)
[facility] EMERGENCY DEPARTMENT Provider Note   CSN: [id number] Arrival date & time: 03/05/17  0024     History   Chief Complaint Chief Complaint  Patient presents with  . Migraine    HPI Diana Jacobs is a 27 y.o. female.  The history is provided by the patient and medical records.     27 year old female with history of genital herpes, presenting to the ED for migraine headache.  She has a history of same.  Reports this began around 930 last p.m. and has been ongoing.  Headache is generalized throughout her head but worse across the forehead.  She reports associated photophobia, phonophobia, and nausea.  Denies vomiting, dizziness, focal numbness or weakness, confusion, blurred vision, slurred speech, or difficulty walking.  She has not had any falls or head trauma.  She is not on anticoagulation.  Past Medical History:  Diagnosis Date  . 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 Benign colposcopy in 2015.  Marland Kitchen Herpes    Reports being told she had herpes, no lesions    Patient Active Problem List   Diagnosis Date Noted  . Low grade squamous intraepithelial lesion (LGSIL) on cervical Pap smear 07/21/2015    Past Surgical History:  Procedure Laterality Date  . EYE SURGERY    . EYE SURGERY    . TOOTH EXTRACTION      OB History    Gravida Para Term Preterm AB Living   3 1 1   2 1    SAB TAB Ectopic Multiple Live Births   1 1     1        Home Medications    Prior to Admission medications   Medication Sig Start Date End Date Taking? Authorizing Provider  etonogestrel (NEXPLANON) 68 MG IMPL implant 1 each by Subdermal route once. Inserted 05/2016    [provider]  ibuprofen (ADVIL,MOTRIN) 600 MG tablet Take 1 tablet (600 mg total) by mouth every 6 (six) hours as needed (pain). 09/23/16   Wojeck, Hinton Dyer, NP    Family History Family History  Problem Relation Age of Onset  .  Diabetes Mother   . Hypertension Mother   . Diabetes Father     Social History Social History   Tobacco Use  . Smoking status: Never Smoker  . Smokeless tobacco: Never Used  Substance Use Topics  . Alcohol use: No  . Drug use: No     Allergies   Patient has no known allergies.   Review of Systems Review of Systems  Neurological: Positive for headaches.  All other systems reviewed and are negative.    Physical Exam Updated Vital Signs BP (!) 134/96 (BP Location: Right Arm)   Pulse 88   Temp 99.4 F (37.4 C) (Oral)   Resp 20   Ht 5\' 4"  (1.626 m)   SpO2 99%   BMI 43.26 kg/m   Physical Exam  Constitutional: She is oriented to person, place, and time. She appears well-developed and well-nourished. No distress.  HENT:  Head: Normocephalic and atraumatic.  Right Ear: External ear normal.  Left Ear: External ear normal.  Mouth/Throat: Oropharynx is clear and moist.  Eyes: Conjunctivae and EOM are normal. Pupils are equal, round, and reactive to light.  Neck: Normal range of motion and full passive range of motion without pain. Neck supple. No neck rigidity.  No rigidity, no meningismus  Cardiovascular: Normal rate, regular rhythm and normal  heart sounds.  No murmur heard. Pulmonary/Chest: Effort normal and breath sounds normal. No respiratory distress. She has no wheezes. She has no rhonchi.  Abdominal: Soft. Bowel sounds are normal. There is no tenderness. There is no guarding.  Musculoskeletal: Normal range of motion. She exhibits no edema.  Neurological: She is alert and oriented to person, place, and time. She has normal strength. She displays no tremor. No cranial nerve deficit or sensory deficit. She displays no seizure activity.  AAOx3, answering questions and following commands appropriately; equal strength UE and LE bilaterally; CN grossly intact; moves all extremities appropriately without ataxia; no focal neuro deficits or facial asymmetry appreciated    Skin: Skin is warm and dry. No rash noted. She is not diaphoretic.  Psychiatric: She has a normal mood and affect. Her behavior is normal. Thought content normal.  Nursing note and vitals reviewed.    ED Treatments / Results  Labs (all labs ordered are listed, but only abnormal results are displayed) Labs Reviewed - No data to display  EKG  EKG Interpretation None       Radiology No results found.  Procedures Procedures (including critical care time)  Medications Ordered in ED Medications  oxyCODONE-acetaminophen (PERCOCET/ROXICET) 5-325 MG per tablet 1 tablet (1 tablet Oral Given 03/05/17 0037)  ketorolac (TORADOL) injection 60 mg (not administered)     Initial Impression / Assessment and Plan / ED Course  I have reviewed the triage vital signs and the nursing notes.  Pertinent labs & imaging results that were available during my care of the patient were reviewed by me and considered in my medical decision making (see chart for details).  27 y.o. F here with headache.  Reports history of migraines, this feels similar.  She is AAOx3. Neurologic exam is non-focal.  She was given percocet in triage but reports no improvement by time of my evaluation.  Additional toradol ordered.  toradol ultimately not given as patient was somewhat sleepy/drowsy from percocet given earlier.  She reported that headache was better after she rested here for a bit.  I have low suspicion for acute intracranial pathology at this time, do not feel she needs emergent head CT.  Suspect migrainous type headache.  Will d/c home.  Recommended excedrin migraine should headache recur.  Discussed plan with patient, she acknowledged understanding and agreed with plan of care.  Return precautions given for new or worsening symptoms.  Final Clinical Impressions(s) / ED Diagnoses   Final diagnoses:  Bad headache    ED Discharge Orders    None       Garlon HatchetSanders, Lisa M, PA-C 03/05/17 0600    Ward,  Layla MawKristen N, DO 03/05/17 [phone]

## 2017-03-05 NOTE — ED Triage Notes (Signed)
Reports headache with sensitivity to sound and light that started around 2130.  Hx of the same.  Took aleve 2 around 2145 with no change in pain.

## 2017-03-05 NOTE — ED Notes (Signed)
Took multiple attempts with verbal stimulation for pt to awaken.  Pt stated that her HA was better.  Pt resting comfortably at this time.

## 2017-06-29 ENCOUNTER — Encounter (HOSPITAL_COMMUNITY): Payer: Self-pay

## 2017-06-29 ENCOUNTER — Emergency Department (HOSPITAL_COMMUNITY): Payer: Medicaid Other

## 2017-06-29 ENCOUNTER — Other Ambulatory Visit: Payer: Self-pay

## 2017-06-29 ENCOUNTER — Emergency Department (HOSPITAL_COMMUNITY)
Admission: EM | Admit: 2017-06-29 | Discharge: 2017-06-29 | Disposition: A | Discharge status: Home / Self Care | Payer: Medicaid Other | Attending: Emergency Medicine | Admitting: Emergency Medicine

## 2017-06-29 DIAGNOSIS — L089 Local infection of the skin and subcutaneous tissue, unspecified: Secondary | ICD-10-CM | POA: Diagnosis not present

## 2017-06-29 DIAGNOSIS — Z3202 Encounter for pregnancy test, result negative: Secondary | ICD-10-CM

## 2017-06-29 DIAGNOSIS — M79674 Pain in right toe(s): Secondary | ICD-10-CM | POA: Diagnosis present

## 2017-06-29 DIAGNOSIS — Z79899 Other long term (current) drug therapy: Secondary | ICD-10-CM | POA: Diagnosis not present

## 2017-06-29 LAB — POC URINE PREG, ED: PREG TEST UR: NEGATIVE

## 2017-06-29 LAB — CBG MONITORING, ED: GLUCOSE-CAPILLARY: 86 mg/dL (ref 70–99)

## 2017-06-29 MED ORDER — BACITRACIN ZINC 500 UNIT/GM EX OINT
TOPICAL_OINTMENT | Freq: Two times a day (BID) | CUTANEOUS | Status: DC
  Administered 2017-06-29: 09:00:00 via TOPICAL

## 2017-06-29 MED ORDER — DOXYCYCLINE HYCLATE 100 MG PO CAPS: 100.0000 mg | ORAL_CAPSULE | Freq: Two times a day (BID) | ORAL | 0 refills | Status: DC

## 2017-06-29 NOTE — ED Triage Notes (Signed)
Pt states she has had right toe pain X1 month. Some swelling and redness noted. No hx of diabetes. Pt also states she needs a pregnancy test. LMP 04/11/17.

## 2017-06-29 NOTE — ED Provider Notes (Signed)
[facility] EMERGENCY DEPARTMENT Provider Note   CSN: [id number] Arrival date & time: 06/29/17  4098     History   Chief Complaint Chief Complaint  Patient presents with  . Toe Pain  . Possible Pregnancy    HPI Diana Jacobs is a 27 y.o. female.  Diana Jacobs is a 27 y.o. Female who is otherwise healthy, presents to the emergency department for evaluation of right toe pain for the last month, she has had a lesion on the medial aspect of the right great toe that was initially itchy, has now become painful, she reports surrounding swelling and redness of the toe, and that it is been draining clear fluid from the lesion for the past 3 days.  Patient denies any history of similar symptoms in the past, no history of diabetes.  She denies associated fevers.  No numbness or tingling in the toe, able to move it has had no difficulty walking aside from some increased pain.  Patient also requesting pregnancy test, last menstrual period 04/11/2017, patient does have history of very irregular periods but wants to ensure she is not pregnant.  She denies any abdominal pain, nausea or vomiting.  No vaginal discharge.  No urinary symptoms.  She used to have Nexplanon implant, which was recently removed.     Past Medical History:  Diagnosis Date  . 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 Benign colposcopy in 2015.  Marland Kitchen Herpes    Reports being told she had herpes, no lesions    Patient Active Problem List   Diagnosis Date Noted  . Low grade squamous intraepithelial lesion (LGSIL) on cervical Pap smear 07/21/2015    Past Surgical History:  Procedure Laterality Date  . EYE SURGERY    . EYE SURGERY    . TOOTH EXTRACTION       OB History    Gravida  3   Para  1   Term  1   Preterm      AB  2   Living  1     SAB  1   TAB  1   Ectopic      Multiple      Live Births  1            Home  Medications    Prior to Admission medications   Medication Sig Start Date End Date Taking? Authorizing Provider  etonogestrel (NEXPLANON) 68 MG IMPL implant 1 each by Subdermal route once. Inserted 05/2016    [provider]  ibuprofen (ADVIL,MOTRIN) 600 MG tablet Take 1 tablet (600 mg total) by mouth every 6 (six) hours as needed (pain). 09/23/16   Wojeck, Hinton Dyer, NP    Family History Family History  Problem Relation Age of Onset  . Diabetes Mother   . Hypertension Mother   . Diabetes Father     Social History Social History   Tobacco Use  . Smoking status: Never Smoker  . Smokeless tobacco: Never Used  Substance Use Topics  . Alcohol use: No  . Drug use: No     Allergies   Patient has no known allergies.   Review of Systems Review of Systems  Constitutional: Negative for chills and fever.  Respiratory: Negative for cough and shortness of breath.   Cardiovascular: Negative for chest pain.  Gastrointestinal: Negative for abdominal pain, nausea and vomiting.  Genitourinary: Positive for menstrual problem. Negative for vaginal bleeding  and vaginal discharge.  Musculoskeletal: Positive for arthralgias and joint swelling.  Skin: Positive for color change.  Neurological: Negative for weakness and numbness.     Physical Exam Updated Vital Signs BP (!) 138/101 (BP Location: Right Arm)   Pulse 92   Temp 98.6 F (37 C) (Oral)   Resp 16   LMP 04/11/2017   SpO2 100%   Physical Exam  Constitutional: She appears well-developed and well-nourished. No distress.  HENT:  Head: Normocephalic and atraumatic.  Eyes: Right eye exhibits no discharge. Left eye exhibits no discharge.  Cardiovascular: Normal rate, regular rhythm, normal heart sounds and intact distal pulses.  Pulmonary/Chest: Effort normal and breath sounds normal. No respiratory distress.  Abdominal: Soft. Bowel sounds are normal. She exhibits no distension and no mass. There is no tenderness. There is  no guarding.  Musculoskeletal:  Right great toe with blisterlike lesion over the medial aspect with clear serous drainage present, there is mild surrounding swelling and erythema, patient is able to move the toe, is very tender to palpation, no evidence of tenderness or swelling surrounding the nailbed to suggest paronychia (please see photo below)  Neurological: She is alert. Coordination normal.  Skin: Skin is warm and dry. She is not diaphoretic.  Psychiatric: She has a normal mood and affect. Her behavior is normal.  Nursing note and vitals reviewed.      ED Treatments / Results  Labs (all labs ordered are listed, but only abnormal results are displayed) Labs Reviewed  POC URINE PREG, ED  CBG MONITORING, ED    EKG None  Radiology Dg Toe Great Right  Result Date: 06/29/2017 CLINICAL DATA:  Infection of the first toe for 3 weeks, no history of diabetes EXAM: RIGHT GREAT TOE COMPARISON:  None. FINDINGS: The right great toe is in normal alignment. Joint spaces appear normal. No erosion is seen. No demineralization is noted. IMPRESSION: Negative. Electronically Signed   By: Dwyane Dee M.D.   On: 06/29/2017 09:05    Procedures Procedures (including critical care time)  Medications Ordered in ED Medications - No data to display   Initial Impression / Assessment and Plan / ED Course  I have reviewed the triage vital signs and the nursing notes.  Pertinent labs & imaging results that were available during my care of the patient were reviewed by me and considered in my medical decision making (see chart for details).  Patient presents to the ED for evaluation of right toe pain for the last month she has had a lesion over the medial aspect that was initially itchy and then become painful, surrounding redness and swelling noted.  Patient reports it has been draining clear fluid for the last 2 to 3 days.  No history of diabetes.  Patient also requesting pregnancy test, last menstrual  period 04/11/2017, recently had Nexplanon implant removed and has been having irregular periods since then.  No abdominal pain, nausea or vomiting, no vaginal bleeding or discharge.  Patient with normal vitals, on exam Right toe with some surrounding redness and swelling, normal range of motion blisterlike lesion to the medial aspect of the and neurovascularly intact.   Pregnancy test negative, CBG 86.  X-ray shows no evidence of osteomyelitis or other acute findings.  Will treat with course of antibiotics and have patient follow-up with podiatry.  Antibiotic ointment and dressing applied here in the emergency department.  Encourage warm soaks to promote drainage.  Return precautions discussed.  Patient expresses understanding and is in agreement with  plan.  Final Clinical Impressions(s) / ED Diagnoses   Final diagnoses:  Skin lesion, infected  Negative pregnancy test    ED Discharge Orders        Ordered    doxycycline (VIBRAMYCIN) 100 MG capsule  2 times daily     06/29/17 0913       Dartha LodgeFord, Kelsey N, PA-C 06/29/17 [phone]    Vanetta MuldersZackowski, Scott, MD 07/03/17 1843

## 2017-06-29 NOTE — Discharge Instructions (Signed)
Please take entire course of antibiotics as directed, you likely have a localized skin infection over the lesion on your toe, I would like for you to do warm soaks multiple times a day to help promote any drainage, and keep the area clean and covered.  I would like you to follow-up with podiatry if this persists.  Pregnancy test today was negative.  If you have fever, significantly worsening redness, swelling and pain in the toe or pain and swelling progresses to the foot was return to the emergency department.

## 2017-06-29 NOTE — ED Notes (Signed)
Dressing applied to right great toe with bacitracin ointment. Pt voices understanding of discharge, NAD on departure. Ambulatory.

## 2017-09-25 ENCOUNTER — Emergency Department (HOSPITAL_COMMUNITY)
Admission: EM | Admit: 2017-09-25 | Discharge: 2017-09-25 | Disposition: A | Discharge status: Home / Self Care | Payer: Medicaid Other | Attending: Emergency Medicine | Admitting: Emergency Medicine

## 2017-09-25 ENCOUNTER — Other Ambulatory Visit: Payer: Self-pay

## 2017-09-25 ENCOUNTER — Encounter (HOSPITAL_COMMUNITY): Payer: Self-pay | Admitting: Emergency Medicine

## 2017-09-25 DIAGNOSIS — J029 Acute pharyngitis, unspecified: Secondary | ICD-10-CM | POA: Diagnosis not present

## 2017-09-25 DIAGNOSIS — Z79899 Other long term (current) drug therapy: Secondary | ICD-10-CM | POA: Diagnosis not present

## 2017-09-25 LAB — GROUP A STREP BY PCR: Group A Strep by PCR: NOT DETECTED

## 2017-09-25 MED ORDER — DEXAMETHASONE SODIUM PHOSPHATE 10 MG/ML IJ SOLN
10.0000 mg | Freq: Once | INTRAMUSCULAR | Status: AC
  Administered 2017-09-25: 10 mg
  Filled 2017-09-25: qty 1

## 2017-09-25 MED ORDER — LIDOCAINE VISCOUS HCL 2 % MT SOLN: 15.0000 mL | OROMUCOSAL | 0 refills | Status: DC | PRN

## 2017-09-25 MED ORDER — PENICILLIN G BENZATHINE 1200000 UNIT/2ML IM SUSP
1.2000 10*6.[IU] | Freq: Once | INTRAMUSCULAR | Status: AC
  Administered 2017-09-25: 1.2 10*6.[IU] via INTRAMUSCULAR
  Filled 2017-09-25: qty 2

## 2017-09-25 NOTE — ED Notes (Signed)
Pt ready for discharge, VS documented

## 2017-09-25 NOTE — ED Notes (Signed)
ED Provider at bedside. 

## 2017-09-25 NOTE — ED Triage Notes (Signed)
Pt reports "I think I have strep throat". Symptoms include hoarse voice, sore throat, swollen tonsils and cough. Denies fever, N/V/D. A/O NAD.

## 2017-09-25 NOTE — Discharge Instructions (Addendum)
Your strep test was negative however have treated you for strep throat.  The Decadron will help with the swelling.  Use the viscous lidocaine to help numb the back your throat.  Motrin and Tylenol to help with pain and swelling.  Please follow-up with a primary care doctor if your symptoms not improving the next week and return the ED with any worsening symptoms.

## 2017-09-25 NOTE — ED Provider Notes (Signed)
[facility] EMERGENCY DEPARTMENT Provider Note   CSN: [id number] Arrival date & time: 09/25/17  1004     History   Chief Complaint Chief Complaint  Patient presents with  . Sore Throat    HPI Diana Jacobs is a 27 y.o. female.  HPI 27 year old female with no pertinent past medical history presents to the emergency department today for evaluation of sore throat.  Onset 3 days ago.  Patient reports hoarse voice, swollen tonsils and a nonproductive cough.  Denies any rhinorrhea or nasal congestion.  She has not taken any medications for symptoms prior to arrival.  Nothing makes better.  Denies any otalgia.  No known sick contacts.  Denies any fevers or chills.  Patient reports pain with swallowing.   Past Medical History:  Diagnosis Date  . 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 Benign colposcopy in 2015.  Marland Kitchen Herpes    Reports being told she had herpes, no lesions    Patient Active Problem List   Diagnosis Date Noted  . Low grade squamous intraepithelial lesion (LGSIL) on cervical Pap smear 07/21/2015    Past Surgical History:  Procedure Laterality Date  . EYE SURGERY    . EYE SURGERY    . TOOTH EXTRACTION       OB History    Gravida  3   Para  1   Term  1   Preterm      AB  2   Living  1     SAB  1   TAB  1   Ectopic      Multiple      Live Births  1            Home Medications    Prior to Admission medications   Medication Sig Start Date End Date Taking? Authorizing Provider  doxycycline (VIBRAMYCIN) 100 MG capsule Take 1 capsule (100 mg total) by mouth 2 (two) times daily. One po bid x 7 days 06/29/17   Dartha Lodge, PA-C  etonogestrel (NEXPLANON) 68 MG IMPL implant 1 each by Subdermal route once. Inserted 05/2016    [provider]  ibuprofen (ADVIL,MOTRIN) 600 MG tablet Take 1 tablet (600 mg total) by mouth every 6 (six) hours as needed (pain). 09/23/16    Wojeck, Hinton Dyer, NP    Family History Family History  Problem Relation Age of Onset  . Diabetes Mother   . Hypertension Mother   . Diabetes Father     Social History Social History   Tobacco Use  . Smoking status: Never Smoker  . Smokeless tobacco: Never Used  Substance Use Topics  . Alcohol use: No  . Drug use: No     Allergies   Patient has no known allergies.   Review of Systems Review of Systems  Constitutional: Negative for chills and fever.  HENT: Positive for sore throat and trouble swallowing. Negative for congestion, ear discharge and rhinorrhea.   Respiratory: Positive for cough.   Gastrointestinal: Negative for vomiting.  Neurological: Negative for headaches.     Physical Exam Updated Vital Signs BP (!) 126/94 (BP Location: Right Arm)   Pulse 88   Temp 99.3 F (37.4 C) (Oral)   Resp 18   Ht 5\' 4"  (1.626 m)   Wt 113.4 kg   LMP 08/25/2017   SpO2 95%   BMI 42.91 kg/m   Physical Exam  Constitutional: She appears well-developed and  well-nourished. No distress.  HENT:  Head: Normocephalic and atraumatic.  Right Ear: Tympanic membrane, external ear and ear canal normal.  Left Ear: Tympanic membrane, external ear and ear canal normal.  Nose: Nose normal.  Mouth/Throat: Uvula is midline and mucous membranes are normal. No trismus in the jaw. No uvula swelling. Oropharyngeal exudate, posterior oropharyngeal edema and posterior oropharyngeal erythema present. Tonsils are 2+ on the right. Tonsils are 2+ on the left.  Eyes: Right eye exhibits no discharge. Left eye exhibits no discharge. No scleral icterus.  Neck: Normal range of motion. Neck supple.  Cardiovascular: Normal rate and regular rhythm.  Pulmonary/Chest: Effort normal and breath sounds normal. No stridor. No respiratory distress. She has no wheezes. She has no rales.  Musculoskeletal: Normal range of motion.  Lymphadenopathy:    She has no cervical adenopathy.  Neurological: She is alert.    Skin: Skin is warm and dry. No pallor.  Psychiatric: Her behavior is normal. Judgment and thought content normal.  Nursing note and vitals reviewed.    ED Treatments / Results  Labs (all labs ordered are listed, but only abnormal results are displayed) Labs Reviewed  GROUP A STREP BY PCR    EKG None  Radiology No results found.  Procedures Procedures (including critical care time)  Medications Ordered in ED Medications  penicillin g benzathine (BICILLIN LA) 1200000 UNIT/2ML injection 1.2 Million Units (1.2 Million Units Intramuscular Given 09/25/17 1059)  dexamethasone (DECADRON) injection 10 mg (10 mg Other Given 09/25/17 1057)     Initial Impression / Assessment and Plan / ED Course  I have reviewed the triage vital signs and the nursing notes.  Pertinent labs & imaging results that were available during my care of the patient were reviewed by me and considered in my medical decision making (see chart for details).     Pt febrile with tonsillar exudate, cervical lymphadenopathy, & dysphagia; diagnosis of strep. Treated in the Ed with steroids and PCN IM. Discussed importance of water rehydration. Presentation non concerning for PTA or infxn spread to soft tissue. No trismus or uvula deviation.  No nuchal rigidity and have low suspicion for meningitis.  Lungs are clear to auscultation bilaterally.  Patient is not hypoxic.  No indication for imaging as I have low suspicion for pneumonia at this time.  Patient vital signs reassuring in the ED.  Specific return precautions discussed. Pt able to drink water in ED without difficulty with intact air way. Recommended PCP follow up for prolonged symptoms for further evaluation and treatment.  Pt is hemodynamically stable, in NAD, & able to ambulate in the ED. Evaluation does not show pathology that would require ongoing emergent intervention or inpatient treatment. I explained the diagnosis to the patient. Pain has been managed & has  no complaints prior to dc. Pt is comfortable with above plan and is stable for discharge at this time. All questions were answered prior to disposition. Strict return precautions for f/u to the ED were discussed. Encouraged follow up with PCP.      Final Clinical Impressions(s) / ED Diagnoses   Final diagnoses:  Sore throat    ED Discharge Orders         Ordered    lidocaine (XYLOCAINE) 2 % solution  As needed     09/25/17 1115           Rise MuLeaphart, Kenneth T, PA-C 09/25/17 1118    Gerhard MunchLockwood, Robert, MD 09/25/17 [phone]

## 2018-04-06 DIAGNOSIS — H538 Other visual disturbances: Secondary | ICD-10-CM | POA: Diagnosis not present

## 2018-04-06 DIAGNOSIS — Q100 Congenital ptosis: Secondary | ICD-10-CM | POA: Diagnosis not present

## 2018-04-06 DIAGNOSIS — H53029 Refractive amblyopia, unspecified eye: Secondary | ICD-10-CM | POA: Diagnosis not present

## 2018-07-25 ENCOUNTER — Emergency Department (HOSPITAL_COMMUNITY)
Admission: EM | Admit: 2018-07-25 | Discharge: 2018-07-25 | Disposition: A | Discharge status: Home / Self Care | Payer: Medicaid Other | Attending: Emergency Medicine | Admitting: Emergency Medicine

## 2018-07-25 ENCOUNTER — Other Ambulatory Visit: Payer: Self-pay

## 2018-07-25 ENCOUNTER — Encounter (HOSPITAL_COMMUNITY): Payer: Self-pay

## 2018-07-25 DIAGNOSIS — J029 Acute pharyngitis, unspecified: Secondary | ICD-10-CM | POA: Insufficient documentation

## 2018-07-25 DIAGNOSIS — B9789 Other viral agents as the cause of diseases classified elsewhere: Secondary | ICD-10-CM | POA: Diagnosis not present

## 2018-07-25 DIAGNOSIS — J028 Acute pharyngitis due to other specified organisms: Secondary | ICD-10-CM | POA: Diagnosis not present

## 2018-07-25 LAB — GROUP A STREP BY PCR: Group A Strep by PCR: NOT DETECTED

## 2018-07-25 MED ORDER — ACETAMINOPHEN 325 MG PO TABS
650.0000 mg | ORAL_TABLET | Freq: Once | ORAL | Status: AC
  Administered 2018-07-25: 650 mg via ORAL
  Filled 2018-07-25: qty 2

## 2018-07-25 MED ORDER — DEXAMETHASONE 1 MG/ML PO CONC
10.0000 mg | Freq: Once | ORAL | Status: AC
  Administered 2018-07-25: 19:00:00 10 mg via ORAL
  Filled 2018-07-25: qty 10

## 2018-07-25 NOTE — ED Provider Notes (Signed)
[facility]   CSN: [id number] Arrival date & time: 07/25/18  1622    History   Chief Complaint Chief Complaint  Patient presents with  . Sore Throat    HPI Real ConsMercedes Gavaughn Jacobs is a 28 y.o. female.     HPI    28 year old female presents today with complaints of sore throat.  Patient notes symptoms started yesterday.  She notes painful swallowing she is able to swallow.  She denies any fever.  She notes some tenderness to the anterior aspect of her neck.  She denies any close sick contacts.  She is not diabetic.  She notes a history of strep and notes this feels similar.  She denies any cough.   Past Medical History:  Diagnosis Date  . 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 Benign colposcopy in 2015.  Marland Kitchen. Herpes    Reports being told she had herpes, no lesions    Patient Active Problem List   Diagnosis Date Noted  . Low grade squamous intraepithelial lesion (LGSIL) on cervical Pap smear 07/21/2015    Past Surgical History:  Procedure Laterality Date  . EYE SURGERY    . EYE SURGERY    . TOOTH EXTRACTION       OB History    Gravida  3   Para  1   Term  1   Preterm      AB  2   Living  1     SAB  1   TAB  1   Ectopic      Multiple      Live Births  1            Home Medications    Prior to Admission medications   Medication Sig Start Date End Date Taking? Authorizing Provider  doxycycline (VIBRAMYCIN) 100 MG capsule Take 1 capsule (100 mg total) by mouth 2 (two) times daily. One po bid x 7 days 06/29/17   Dartha LodgeFord, Kelsey N, PA-C  etonogestrel (NEXPLANON) 68 MG IMPL implant 1 each by Subdermal route once. Inserted 05/2016    [provider]  ibuprofen (ADVIL,MOTRIN) 600 MG tablet Take 1 tablet (600 mg total) by mouth every 6 (six) hours as needed (pain). 09/23/16   Wojeck, Hinton Dyerobyn K, NP  lidocaine (XYLOCAINE) 2 % solution Use as  directed 15 mLs in the mouth or throat as needed for mouth pain. gargle and spit 09/25/17   Leaphart, Lynann BeaverKenneth T, PA-C    Family History Family History  Problem Relation Age of Onset  . Diabetes Mother   . Hypertension Mother   . Diabetes Father     Social History Social History   Tobacco Use  . Smoking status: Never Smoker  . Smokeless tobacco: Never Used  Substance Use Topics  . Alcohol use: No  . Drug use: No     Allergies   Patient has no known allergies.   Review of Systems Review of Systems  All other systems reviewed and are negative.   Physical Exam Updated Vital Signs BP 123/90 (BP Location: Right Arm)   Pulse 94   Temp 98.6 F (37 C) (Oral)   Resp 16   SpO2 97%   Physical Exam Vitals signs and nursing note reviewed.  Constitutional:      Appearance: She is well-developed.  HENT:     Head: Normocephalic and atraumatic.     Comments: Bilateral tonsillar  swelling with erythema small amount of exudate, uvula midline rises with phonation, voice is normal no pooling of secretions neck supple full active range of motion minor tender bilateral anterior cervical lymphadenopathy Eyes:     General: No scleral icterus.       Right eye: No discharge.        Left eye: No discharge.     Conjunctiva/sclera: Conjunctivae normal.     Pupils: Pupils are equal, round, and reactive to light.  Neck:     Musculoskeletal: Normal range of motion.     Vascular: No JVD.     Trachea: No tracheal deviation.  Pulmonary:     Effort: Pulmonary effort is normal.     Breath sounds: No stridor.  Neurological:     Mental Status: She is alert and oriented to person, place, and time.     Coordination: Coordination normal.  Psychiatric:        Behavior: Behavior normal.        Thought Content: Thought content normal.        Judgment: Judgment normal.      ED Treatments / Results  Labs (all labs ordered are listed, but only abnormal results are displayed) Labs Reviewed   GROUP A STREP BY PCR    EKG None  Radiology No results found.  Procedures Procedures (including critical care time)  Medications Ordered in ED Medications  dexamethasone (DECADRON) 1 MG/ML solution 10 mg (has no administration in time range)  acetaminophen (TYLENOL) tablet 650 mg (has no administration in time range)     Initial Impression / Assessment and Plan / ED Course  I have reviewed the triage vital signs and the nursing notes.  Pertinent labs & imaging results that were available during my care of the patient were reviewed by me and considered in my medical decision making (see chart for details).         Assessment/Plan: 28 year old female presents today with sore throat.  This is likely viral pharyngitis.  Her strep is negative here.  She will be given steroids and.  She is Tylenol ibuprofen.  She is given strict return precautions.  She verbalized understanding and agreement to today's plan had no further questions or concerns at time discharge.       Final Clinical Impressions(s) / ED Diagnoses   Final diagnoses:  Viral pharyngitis    ED Discharge Orders    None       Francee Gentile 07/25/18 Stann Ore, MD 07/25/18 [phone]

## 2018-07-25 NOTE — ED Notes (Signed)
Discharge instructions discussed with Pt. Pt verbalized understanding. Pt stable and ambulatory.    

## 2018-07-25 NOTE — ED Triage Notes (Signed)
Pt reports having a Sore throat since yesterday

## 2018-07-25 NOTE — Discharge Instructions (Addendum)
Please read attached information. If you experience any new or worsening signs or symptoms please return to the emergency room for evaluation. Please follow-up with your primary care provider or specialist as discussed.  °

## 2018-10-11 DIAGNOSIS — Z20828 Contact with and (suspected) exposure to other viral communicable diseases: Secondary | ICD-10-CM | POA: Diagnosis not present

## 2018-11-07 ENCOUNTER — Other Ambulatory Visit (HOSPITAL_COMMUNITY)
Admission: RE | Admit: 2018-11-07 | Discharge: 2018-11-07 | Disposition: A | Discharge status: Home / Self Care | Payer: Medicaid Other | Source: Ambulatory Visit | Attending: Obstetrics | Admitting: Obstetrics

## 2018-11-07 ENCOUNTER — Ambulatory Visit: Payer: Medicaid Other | Admitting: Obstetrics

## 2018-11-07 ENCOUNTER — Ambulatory Visit: Payer: Medicaid Other | Admitting: Women's Health

## 2018-11-07 ENCOUNTER — Other Ambulatory Visit: Payer: Self-pay

## 2018-11-07 ENCOUNTER — Encounter: Payer: Self-pay | Admitting: Obstetrics

## 2018-11-07 VITALS — BP 132/86 | HR 81 | Ht 64.0 in | Wt 259.1 lb

## 2018-11-07 DIAGNOSIS — Z01419 Encounter for gynecological examination (general) (routine) without abnormal findings: Secondary | ICD-10-CM

## 2018-11-07 DIAGNOSIS — Z6841 Body Mass Index (BMI) 40.0 and over, adult: Secondary | ICD-10-CM

## 2018-11-07 DIAGNOSIS — Z3169 Encounter for other general counseling and advice on procreation: Secondary | ICD-10-CM

## 2018-11-07 DIAGNOSIS — Z113 Encounter for screening for infections with a predominantly sexual mode of transmission: Secondary | ICD-10-CM | POA: Diagnosis not present

## 2018-11-07 DIAGNOSIS — N898 Other specified noninflammatory disorders of vagina: Secondary | ICD-10-CM

## 2018-11-07 DIAGNOSIS — Z3202 Encounter for pregnancy test, result negative: Secondary | ICD-10-CM | POA: Diagnosis not present

## 2018-11-07 DIAGNOSIS — Z0000 Encounter for general adult medical examination without abnormal findings: Secondary | ICD-10-CM | POA: Diagnosis not present

## 2018-11-07 LAB — POCT URINE PREGNANCY: Preg Test, Ur: NEGATIVE

## 2018-11-07 MED ORDER — PRENATE MINI 29-0.6-0.4-350 MG PO CAPS: 1.0000 | ORAL_CAPSULE | Freq: Every day | ORAL | 11 refills | Status: DC

## 2018-11-07 NOTE — Progress Notes (Signed)
Pt is in the office for annual, last pap 08-20-16. Pt desires full std testing. Pt declines birth control because she is trying to get pregnant. GAD7= 0

## 2018-11-07 NOTE — Progress Notes (Signed)
Subjective:        Diana Jacobs is a 28 y.o. female here for a routine exam.  Current complaints: Malodorous vaginal discharge.  Denies vaginal irritation.  Personal health questionnaire:  Is patient Ashkenazi Jewish, have a family history of breast and/or ovarian cancer: no Is there a family history of uterine cancer diagnosed at age < 7, gastrointestinal cancer, urinary tract cancer, family member who is a Personnel officer syndrome-associated carrier: no Is the patient overweight and hypertensive, family history of diabetes, personal history of gestational diabetes, preeclampsia or PCOS: no Is patient over 83, have PCOS,  family history of premature CHD under age 52, diabetes, smoke, have hypertension or peripheral artery disease:  no At any time, has a partner hit, kicked or otherwise hurt or frightened you?: no Over the past 2 weeks, have you felt down, depressed or hopeless?: no Over the past 2 weeks, have you felt little interest or pleasure in doing things?:no   Gynecologic History Patient's last menstrual period was 10/27/2018. Contraception: none Last Pap: 08-10-2016. Results were: normal Last mammogram: n/a. Results were: n/a  Obstetric History OB History  Gravida Para Term Preterm AB Living  3 1 1   2 1   SAB TAB Ectopic Multiple Live Births  1 1     1     # Outcome Date GA Lbr Len/2nd Weight Sex Delivery Anes PTL Lv  3 Term 02/28/13 [redacted]w[redacted]d / 01:22 8 lb 4.8 oz (3.765 kg) M Vag-Spont EPI  LIV  2 TAB 2015          1 SAB 02/02/11             Birth Comments: 7wks    Past Medical History:  Diagnosis Date  . 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 Benign colposcopy in 2015.  06/27/2014 Herpes    Reports being told she had herpes, no lesions    Past Surgical History:  Procedure Laterality Date  . EYE SURGERY    . EYE SURGERY    . TOOTH EXTRACTION       Current Outpatient Medications:  .  doxycycline (VIBRAMYCIN)  100 MG capsule, Take 1 capsule (100 mg total) by mouth 2 (two) times daily. One po bid x 7 days (Patient not taking: Reported on 11/07/2018), Disp: 14 capsule, Rfl: 0 .  etonogestrel (NEXPLANON) 68 MG IMPL implant, 1 each by Subdermal route once. Inserted 05/2016, Disp: , Rfl:  .  ibuprofen (ADVIL,MOTRIN) 600 MG tablet, Take 1 tablet (600 mg total) by mouth every 6 (six) hours as needed (pain). (Patient not taking: Reported on 11/07/2018), Disp: 28 tablet, Rfl: 0 .  lidocaine (XYLOCAINE) 2 % solution, Use as directed 15 mLs in the mouth or throat as needed for mouth pain. gargle and spit (Patient not taking: Reported on 11/07/2018), Disp: 100 mL, Rfl: 0 .  Prenat w/o A-FeCbn-Meth-FA-DHA (PRENATE MINI) 29-0.6-0.4-350 MG CAPS, Take 1 capsule by mouth daily before breakfast., Disp: 30 capsule, Rfl: 11 No Known Allergies  Social History   Tobacco Use  . Smoking status: Never Smoker  . Smokeless tobacco: Never Used  Substance Use Topics  . Alcohol use: No    Family History  Problem Relation Age of Onset  . Diabetes Mother   . Hypertension Mother   . Diabetes Father       Review of Systems  Constitutional: negative for fatigue and weight loss Respiratory: negative for cough and wheezing Cardiovascular: negative for  chest pain, fatigue and palpitations Gastrointestinal: negative for abdominal pain and change in bowel habits Musculoskeletal:negative for myalgias Neurological: negative for gait problems and tremors Behavioral/Psych: negative for abusive relationship, depression Endocrine: negative for temperature intolerance    Genitourinary:negative for abnormal menstrual periods, genital lesions, hot flashes, sexual problems.  Positive for malodorous vaginal discharge Integument/breast: negative for breast lump, breast tenderness, nipple discharge and skin lesion(s)    Objective:       BP 132/86   Pulse 81   Ht 5\' 4"  (1.626 m)   Wt 259 lb 1.6 oz (117.5 kg)   LMP 10/27/2018   BMI  44.47 kg/m            General:  Alert and no distress Abdomen:  normal findings: no organomegaly, soft, non-tender and no hernia  Pelvis:  External genitalia: normal general appearance Urinary system: urethral meatus normal and bladder without fullness, nontender Vaginal: normal without tenderness, induration or masses Cervix: normal appearance Adnexa: normal bimanual exam Uterus: anteverted and non-tender, normal size   Lab Review Urine pregnancy test Labs reviewed yes Radiologic studies reviewed no  50% of 25 min visit spent on counseling and coordination of care.   Assessment:     1. Encounter for routine gynecological examination with Papanicolaou smear of cervix Rx: - Cytology - PAP( [facility])  2. Vaginal discharge Rx - Cervicovaginal ancillary only( )  3. Screen for STD (sexually transmitted disease) Rx: - HIV antibody (with reflex) - RPR - Hepatitis B Surface AntiGEN - Hepatitis C Antibody  4. Class 3 severe obesity due to excess calories without serious comorbidity with body mass index (BMI) of 40.0 to 44.9 in adult [facility]) - program of caloric restriction, exercise and behavioral modification recommended  5. Encounter for preconception consultation Rx: - Prenat w/o A-FeCbn-Meth-FA-DHA (PRENATE MINI) 29-0.6-0.4-350 MG CAPS; Take 1 capsule by mouth daily before breakfast.  Dispense: 30 capsule; Refill: 11 - POCT urine pregnancy    Plan:    Education reviewed: calcium supplements, depression evaluation, low fat, low cholesterol diet, safe sex/STD prevention, self breast exams, weight bearing exercise and behavioral modification. Contraception: none. Follow up in: 1 year. Preconception counseling done, emphasizing the need to start taking PNV's months prior to pregnancy   Meds ordered this encounter  Medications  . Prenat w/o A-FeCbn-Meth-FA-DHA (PRENATE MINI) 29-0.6-0.4-350 MG CAPS    Sig: Take 1 capsule by mouth daily before breakfast.     Dispense:  30 capsule    Refill:  11   Orders Placed This Encounter  Procedures  . HIV antibody (with reflex)  . RPR  . Hepatitis B Surface AntiGEN  . Hepatitis C Antibody     Shelly Bombard, MD 11/07/2018 3:48 PM

## 2018-11-08 LAB — CERVICOVAGINAL ANCILLARY ONLY
Bacterial Vaginitis (gardnerella): POSITIVE — AB
Candida Glabrata: NEGATIVE
Candida Vaginitis: NEGATIVE
Chlamydia: NEGATIVE
Comment: NEGATIVE
Comment: NEGATIVE
Comment: NEGATIVE
Comment: NEGATIVE
Comment: NEGATIVE
Comment: NORMAL
Neisseria Gonorrhea: NEGATIVE
Trichomonas: POSITIVE — AB

## 2018-11-08 LAB — HEPATITIS B SURFACE ANTIGEN: Hepatitis B Surface Ag: NEGATIVE

## 2018-11-08 LAB — RPR: RPR Ser Ql: NONREACTIVE

## 2018-11-08 LAB — HIV ANTIBODY (ROUTINE TESTING W REFLEX): HIV Screen 4th Generation wRfx: NONREACTIVE

## 2018-11-08 LAB — HEPATITIS C ANTIBODY: Hep C Virus Ab: <0.1 s/co ratio (ref 0.0–0.9)

## 2018-11-09 ENCOUNTER — Telehealth: Payer: Self-pay

## 2018-11-09 NOTE — Telephone Encounter (Signed)
  Returned call to patient, advised her that Rx was sent 11/07/18 and her Pharmacy acknowledge receipt @3 :48 pm

## 2018-11-12 ENCOUNTER — Other Ambulatory Visit: Payer: Self-pay | Admitting: Obstetrics

## 2018-11-12 DIAGNOSIS — A5901 Trichomonal vulvovaginitis: Secondary | ICD-10-CM

## 2018-11-12 DIAGNOSIS — N760 Acute vaginitis: Secondary | ICD-10-CM

## 2018-11-12 DIAGNOSIS — B9689 Other specified bacterial agents as the cause of diseases classified elsewhere: Secondary | ICD-10-CM

## 2018-11-12 LAB — CYTOLOGY - PAP
Comment: NEGATIVE
Diagnosis: NEGATIVE
High risk HPV: NEGATIVE

## 2018-11-12 MED ORDER — METRONIDAZOLE 500 MG PO TABS: 500.0000 mg | ORAL_TABLET | Freq: Two times a day (BID) | ORAL | 2 refills | Status: DC

## 2018-11-22 ENCOUNTER — Ambulatory Visit: Payer: Medicaid Other | Admitting: Women's Health

## 2018-12-04 DIAGNOSIS — H5213 Myopia, bilateral: Secondary | ICD-10-CM | POA: Diagnosis not present

## 2018-12-20 DIAGNOSIS — H5213 Myopia, bilateral: Secondary | ICD-10-CM | POA: Diagnosis not present

## 2019-01-14 ENCOUNTER — Ambulatory Visit (INDEPENDENT_AMBULATORY_CARE_PROVIDER_SITE_OTHER): Payer: Medicaid Other

## 2019-01-14 ENCOUNTER — Other Ambulatory Visit: Payer: Self-pay

## 2019-01-14 ENCOUNTER — Ambulatory Visit (HOSPITAL_COMMUNITY)
Admission: EM | Admit: 2019-01-14 | Discharge: 2019-01-14 | Disposition: A | Discharge status: Home / Self Care | Payer: Medicaid Other | Attending: Family Medicine | Admitting: Family Medicine

## 2019-01-14 DIAGNOSIS — Z3202 Encounter for pregnancy test, result negative: Secondary | ICD-10-CM | POA: Diagnosis not present

## 2019-01-14 DIAGNOSIS — S63501A Unspecified sprain of right wrist, initial encounter: Secondary | ICD-10-CM

## 2019-01-14 DIAGNOSIS — M25531 Pain in right wrist: Secondary | ICD-10-CM | POA: Diagnosis not present

## 2019-01-14 LAB — POCT PREGNANCY, URINE: Preg Test, Ur: NEGATIVE

## 2019-01-14 LAB — POC URINE PREG, ED: Preg Test, Ur: NEGATIVE

## 2019-01-14 MED ORDER — PREDNISONE 20 MG PO TABS: ORAL_TABLET | ORAL | 0 refills | Status: DC

## 2019-01-14 NOTE — Discharge Instructions (Addendum)
Strategies to prevent and/or treat COVID-19:  Vitamin D3 5000 IU (125 mcg) daily Vitamin C 500 mg twice daily Zinc 50 to 75 mg daily  Listerine type mouthwash 4 times a day    

## 2019-01-14 NOTE — ED Triage Notes (Signed)
Pt presents with right wrist injury after a fall 2 weeks ago; pt also presents for pregnancy test after missed period.

## 2019-01-14 NOTE — ED Provider Notes (Signed)
[facility]    CSN: [id number] Arrival date & time: 01/14/19  0844      History   Chief Complaint Chief Complaint  Patient presents with  . Wrist Pain  . Possible Pregnancy    HPI Diana Jacobs is a 29 y.o. female.   Initial [facility] visit for this 29 yo woman  Pt presents with right wrist injury after a fall 19 days ago; pt also presents for pregnancy test after missed period.  Right wrist hurts with any movement, both sides of wrist.  Other scrapes and bruises have healed.  No neck pain or headache.   Unemployed at present      Past Medical History:  Diagnosis Date  . 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 Benign colposcopy in 2015.  Marland Kitchen Herpes    Reports being told she had herpes, no lesions    Patient Active Problem List   Diagnosis Date Noted  . Low grade squamous intraepithelial lesion (LGSIL) on cervical Pap smear 07/21/2015    Past Surgical History:  Procedure Laterality Date  . EYE SURGERY    . EYE SURGERY    . TOOTH EXTRACTION      OB History    Gravida  3   Para  1   Term  1   Preterm      AB  2   Living  1     SAB  1   TAB  1   Ectopic      Multiple      Live Births  1            Home Medications    Prior to Admission medications   Medication Sig Start Date End Date Taking? Authorizing Provider  predniSONE (DELTASONE) 20 MG tablet Two daily with food 01/14/19   Robyn Haber, MD  Prenat w/o A-FeCbn-Meth-FA-DHA (PRENATE MINI) 29-0.6-0.4-350 MG CAPS Take 1 capsule by mouth daily before breakfast. 11/07/18   Shelly Bombard, MD  etonogestrel (NEXPLANON) 68 MG IMPL implant 1 each by Subdermal route once. Inserted 05/2016  01/14/19  [provider]    Family History Family History  Problem Relation Age of Onset  . Diabetes Mother   . Hypertension Mother   . Diabetes Father     Social History Social History   Tobacco Use  .  Smoking status: Never Smoker  . Smokeless tobacco: Never Used  Substance Use Topics  . Alcohol use: No  . Drug use: No     Allergies   Patient has no known allergies.   Review of Systems Review of Systems  All other systems reviewed and are negative.    Physical Exam Triage Vital Signs ED Triage Vitals  Enc Vitals Group     BP 01/14/19 0913 132/87     Pulse Rate 01/14/19 0913 77     Resp 01/14/19 0913 18     Temp 01/14/19 0913 98.7 F (37.1 C)     Temp Source 01/14/19 0913 Oral     SpO2 01/14/19 0913 99 %     Weight --      Height --      Head Circumference --      Peak Flow --      Pain Score 01/14/19 0911 7     Pain Loc --      Pain Edu? --      Excl. in [place]? --  No data found.  Updated Vital Signs BP 132/87 (BP Location: Left Arm)   Pulse 77   Temp 98.7 F (37.1 C) (Oral)   Resp 18   LMP 12/10/2018   SpO2 99%    Physical Exam Vitals and nursing note reviewed.  Constitutional:      General: She is not in acute distress.    Appearance: Normal appearance. She is obese. She is not ill-appearing or toxic-appearing.  HENT:     Head: Normocephalic.  Eyes:     Conjunctiva/sclera: Conjunctivae normal.  Cardiovascular:     Rate and Rhythm: Normal rate.  Pulmonary:     Effort: Pulmonary effort is normal.  Musculoskeletal:        General: Swelling, tenderness and signs of injury present.     Cervical back: Normal range of motion and neck supple.     Comments: Right wrist is mildly swollen over the distal radius and ulna.  She is tender in these areas.  She is unable to pronate or supinate very much, dorsiflex or volar flexion is also painful  Skin:    General: Skin is warm and dry.  Neurological:     General: No focal deficit present.     Mental Status: She is alert and oriented to person, place, and time.  Psychiatric:        Mood and Affect: Mood normal.        Behavior: Behavior normal.        Thought Content: Thought content normal.         Judgment: Judgment normal.      UC Treatments / Results  Labs (all labs ordered are listed, but only abnormal results are displayed) Labs Reviewed  POC URINE PREG, ED  POCT PREGNANCY, URINE    EKG   Radiology DG Wrist Complete Right  Result Date: 01/14/2019 CLINICAL DATA:  Recent fall with right wrist pain. EXAM: RIGHT WRIST - COMPLETE 3+ VIEW COMPARISON:  None. FINDINGS: There is no evidence of fracture or dislocation. There is no evidence of arthropathy or other focal bone abnormality. Soft tissues are unremarkable. IMPRESSION: No right wrist fracture or dislocation. Electronically Signed   By: Delbert Phenix M.D.   On: 01/14/2019 09:44    Procedures Procedures (including critical care time)  Medications Ordered in UC Medications - No data to display  Initial Impression / Assessment and Plan / UC Course  I have reviewed the triage vital signs and the nursing notes.  Pertinent labs & imaging results that were available during my care of the patient were reviewed by me and considered in my medical decision making (see chart for details).    Final Clinical Impressions(s) / UC Diagnoses   Final diagnoses:  Sprain of right wrist, initial encounter     Discharge Instructions     Strategies to prevent and/or treat COVID-19:  Vitamin D3 5000 IU (125 mcg) daily Vitamin C 500 mg twice daily Zinc 50 to 75 mg daily  Listerine type mouthwash 4 times a day      ED Prescriptions    Medication Sig Dispense Auth. Provider   predniSONE (DELTASONE) 20 MG tablet Two daily with food 10 tablet Elvina Sidle, MD     PDMP not reviewed this encounter.   Elvina Sidle, MD 01/14/19 1006

## 2019-01-21 ENCOUNTER — Encounter (HOSPITAL_COMMUNITY): Payer: Self-pay

## 2019-01-21 ENCOUNTER — Ambulatory Visit (HOSPITAL_COMMUNITY)
Admission: EM | Admit: 2019-01-21 | Discharge: 2019-01-21 | Disposition: A | Discharge status: Home / Self Care | Payer: Medicaid Other | Attending: Family Medicine | Admitting: Family Medicine

## 2019-01-21 ENCOUNTER — Other Ambulatory Visit: Payer: Self-pay

## 2019-01-21 DIAGNOSIS — Z833 Family history of diabetes mellitus: Secondary | ICD-10-CM | POA: Diagnosis not present

## 2019-01-21 DIAGNOSIS — J069 Acute upper respiratory infection, unspecified: Secondary | ICD-10-CM | POA: Insufficient documentation

## 2019-01-21 DIAGNOSIS — U071 COVID-19: Secondary | ICD-10-CM | POA: Insufficient documentation

## 2019-01-21 DIAGNOSIS — Z20822 Contact with and (suspected) exposure to covid-19: Secondary | ICD-10-CM | POA: Diagnosis not present

## 2019-01-21 DIAGNOSIS — Z8249 Family history of ischemic heart disease and other diseases of the circulatory system: Secondary | ICD-10-CM | POA: Insufficient documentation

## 2019-01-21 NOTE — ED Triage Notes (Signed)
Pt present chills, congestion and body aches.symptom started over the weekend. Pt denies been around anyone who has been exposed to covid.

## 2019-01-21 NOTE — Discharge Instructions (Signed)
Self isolate until covid results are back and negative.  Will notify you by phone of any positive findings. Your negative results will be sent through your MyChart.     Push fluids to ensure adequate hydration and keep secretions thin.  Tylenol and/or ibuprofen as needed for pain or fevers.  Over the counter medications as needed for symptoms.  If symptoms worsen or do not improve in the next week to return to be seen or to follow up with your PCP.   

## 2019-01-21 NOTE — ED Provider Notes (Signed)
[facility]    CSN: [id number] Arrival date & time: 01/21/19  1911      History   Chief Complaint Chief Complaint  Patient presents with  . Chills  . Nasal Congestion    HPI Diana Jacobs is a 29 y.o. female.   Diana Jacobs presents with complaints of chills, sweating, body aches. Some nasal congestion and facial pressure. Some headache. No fevers. Symptoms started about 3 days ago. No gi symptoms. No chest pain , no shortness of breath. No loss of taste or smell. Has taken advil which has helped some. No known ill contacts. Doesn't work outside of the home. Without contributing medical history.      ROS per HPI, negative if not otherwise mentioned.      Past Medical History:  Diagnosis Date  . 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 Benign colposcopy in 2015.  Marland Kitchen Herpes    Reports being told she had herpes, no lesions    Patient Active Problem List   Diagnosis Date Noted  . Low grade squamous intraepithelial lesion (LGSIL) on cervical Pap smear 07/21/2015    Past Surgical History:  Procedure Laterality Date  . EYE SURGERY    . EYE SURGERY    . TOOTH EXTRACTION      OB History    Gravida  3   Para  1   Term  1   Preterm      AB  2   Living  1     SAB  1   TAB  1   Ectopic      Multiple      Live Births  1            Home Medications    Prior to Admission medications   Medication Sig Start Date End Date Taking? Authorizing Provider  etonogestrel (NEXPLANON) 68 MG IMPL implant 1 each by Subdermal route once. Inserted 05/2016  01/14/19  [provider]    Family History Family History  Problem Relation Age of Onset  . Diabetes Mother   . Hypertension Mother   . Diabetes Father     Social History Social History   Tobacco Use  . Smoking status: Never Smoker  . Smokeless tobacco: Never Used  Substance Use Topics  . Alcohol  use: No  . Drug use: No     Allergies   Patient has no known allergies.   Review of Systems Review of Systems   Physical Exam Triage Vital Signs ED Triage Vitals [01/21/19 1944]  Enc Vitals Group     BP 133/88     Pulse Rate 99     Resp 16     Temp 98.8 F (37.1 C)     Temp Source Oral     SpO2 100 %     Weight      Height      Head Circumference      Peak Flow      Pain Score 0     Pain Loc      Pain Edu?      Excl. in [place]?    No data found.  Updated Vital Signs BP 133/88 (BP Location: Left Arm)   Pulse 99   Temp 98.8 F (37.1 C) (Oral)   Resp 16   LMP 01/21/2019   SpO2 100%    Physical Exam Constitutional:      General: She  is not in acute distress.    Appearance: She is well-developed.  Cardiovascular:     Rate and Rhythm: Normal rate.  Pulmonary:     Effort: Pulmonary effort is normal.  Skin:    General: Skin is warm and dry.  Neurological:     Mental Status: She is alert and oriented to person, place, and time.      UC Treatments / Results  Labs (all labs ordered are listed, but only abnormal results are displayed) Labs Reviewed  NOVEL CORONAVIRUS, NAA (HOSP ORDER, SEND-OUT TO REF LAB; TAT 18-24 HRS)    EKG   Radiology No results found.  Procedures Procedures (including critical care time)  Medications Ordered in UC Medications - No data to display  Initial Impression / Assessment and Plan / UC Course  I have reviewed the triage vital signs and the nursing notes.  Pertinent labs & imaging results that were available during my care of the patient were reviewed by me and considered in my medical decision making (see chart for details).     Non toxic. Benign physical exam.  History and physical consistent with viral illness.  covid testing collected and pending. Supportive cares recommended. Return precautions provided. Patient verbalized understanding and agreeable to plan.   Final Clinical Impressions(s) / UC Diagnoses    Final diagnoses:  Upper respiratory tract infection, unspecified type     Discharge Instructions     Self isolate until covid results are back and negative.  Will notify you by phone of any positive findings. Your negative results will be sent through your MyChart.     Push fluids to ensure adequate hydration and keep secretions thin.  Tylenol and/or ibuprofen as needed for pain or fevers.  Over the counter  medications as needed for symptoms.  If symptoms worsen or do not improve in the next week to return to be seen or to follow up with your PCP.      ED Prescriptions    None     PDMP not reviewed this encounter.   Zigmund Gottron, NP 01/21/19 2033

## 2019-01-23 ENCOUNTER — Telehealth: Payer: Self-pay

## 2019-01-23 LAB — NOVEL CORONAVIRUS, NAA (HOSP ORDER, SEND-OUT TO REF LAB; TAT 18-24 HRS): SARS-CoV-2, NAA: DETECTED — AB

## 2019-01-23 NOTE — Telephone Encounter (Signed)
Pt. Calling to verify her positive COVID 19 results. Will quarantine x 10 days from beginning of symptoms. Reviewed home safety and when to seek medical attention. Health Dept. Notified.

## 2019-01-24 ENCOUNTER — Telehealth: Payer: Self-pay | Admitting: Nurse Practitioner

## 2019-01-24 NOTE — Telephone Encounter (Signed)
Called to discuss with Diana Jacobs about Covid symptoms and the use of bamlanivimab, a monoclonal antibody infusion for those with mild to moderate Covid symptoms and at a high risk of hospitalization.     Pt is qualified for this infusion at the [facility] infusion center due to co-morbid conditions (BMI 44) and/or a member of an at-risk group, however declines infusion at this time due to lack of childcare. Discussed with patient if she is able to find childcare for her children to call and request scheduling.    Symptoms tier reviewed as well as criteria for ending isolation.  Symptoms reviewed that would warrant ED/Hospital evaluation. Preventative practices reviewed. Callback number to the infusion center given. Patient advised to go to Urgent care or ED with severe symptoms. Last date she would be eligible for infusion is 01/30/19.  Patient Active Problem List   Diagnosis Date Noted  . Low grade squamous intraepithelial lesion (LGSIL) on cervical Pap smear 07/21/2015    Willette Alma, AGPCNP-BC Pager: [phone] Amion: N. Cousar

## 2019-03-29 ENCOUNTER — Ambulatory Visit: Payer: Medicaid Other | Attending: Internal Medicine

## 2019-03-29 DIAGNOSIS — Z20822 Contact with and (suspected) exposure to covid-19: Secondary | ICD-10-CM | POA: Diagnosis not present

## 2019-03-30 LAB — NOVEL CORONAVIRUS, NAA: SARS-CoV-2, NAA: NOT DETECTED

## 2019-03-30 LAB — SARS-COV-2, NAA 2 DAY TAT

## 2019-04-06 ENCOUNTER — Ambulatory Visit: Payer: Medicaid Other | Attending: Internal Medicine

## 2019-04-06 DIAGNOSIS — Z23 Encounter for immunization: Secondary | ICD-10-CM

## 2019-04-06 NOTE — Progress Notes (Signed)
   Covid-19 Vaccination Clinic  Name:  Diana Jacobs    MRN: [id number] DOB: 10-Jul-1990  04/06/2019  [name] was observed post Covid-19 immunization for 15 minutes without incident. She was provided with Vaccine Information Sheet and instruction to access the V-Safe system.   [name] was instructed to call 911 with any severe reactions post vaccine: Marland Kitchen Difficulty breathing  . Swelling of face and throat  . A fast heartbeat  . A bad rash all over body  . Dizziness and weakness   Immunizations Administered    Name Date Dose VIS Date Route   Pfizer COVID-19 Vaccine 04/06/2019  8:43 AM 0.3 mL 12/14/2018 Intramuscular   Manufacturer: [employer], [employer]   Lot: [id number]   NDC: [address]-2

## 2019-04-08 DIAGNOSIS — Q100 Congenital ptosis: Secondary | ICD-10-CM | POA: Diagnosis not present

## 2019-04-08 DIAGNOSIS — H53029 Refractive amblyopia, unspecified eye: Secondary | ICD-10-CM | POA: Diagnosis not present

## 2019-04-08 DIAGNOSIS — H538 Other visual disturbances: Secondary | ICD-10-CM | POA: Diagnosis not present

## 2019-04-30 ENCOUNTER — Ambulatory Visit: Payer: Medicaid Other | Attending: Internal Medicine

## 2019-04-30 DIAGNOSIS — Z23 Encounter for immunization: Secondary | ICD-10-CM

## 2019-04-30 NOTE — Progress Notes (Signed)
   Covid-19 Vaccination Clinic  Name:  Talasia Saulter    MRN: [id number] DOB: 1990-06-29  04/30/2019  Ms. Caniglia was observed post Covid-19 immunization for 15 minutes without incident. She was provided with Vaccine Information Sheet and instruction to access the V-Safe system.   Ms. Schlereth was instructed to call 911 with any severe reactions post vaccine: Marland Kitchen Difficulty breathing  . Swelling of face and throat  . A fast heartbeat  . A bad rash all over body  . Dizziness and weakness   Immunizations Administered    Name Date Dose VIS Date Route   Pfizer COVID-19 Vaccine 04/30/2019  9:11 AM 0.3 mL 02/27/2018 Intramuscular   Manufacturer: [employer], [employer]   Lot: [id number]   NDC: [address]-1

## 2019-05-23 ENCOUNTER — Other Ambulatory Visit: Payer: Self-pay

## 2019-05-23 ENCOUNTER — Encounter (HOSPITAL_COMMUNITY): Payer: Self-pay

## 2019-05-23 ENCOUNTER — Ambulatory Visit (HOSPITAL_COMMUNITY)
Admission: EM | Admit: 2019-05-23 | Discharge: 2019-05-23 | Disposition: A | Discharge status: Home / Self Care | Payer: Medicaid Other | Attending: Internal Medicine | Admitting: Internal Medicine

## 2019-05-23 DIAGNOSIS — Z113 Encounter for screening for infections with a predominantly sexual mode of transmission: Secondary | ICD-10-CM

## 2019-05-23 NOTE — ED Triage Notes (Signed)
Pt is here wanting STD testing after her new partner informed her that he went to get tested himself. Pt is denying ALL symptoms, pt last had unprotected sex Saturday.

## 2019-05-23 NOTE — ED Provider Notes (Signed)
[facility]    CSN: [id number] Arrival date & time: 05/23/19  1754      History   Chief Complaint Chief Complaint  Patient presents with  . S74.5    HPI Tuana Hoheisel Hogge is a 29 y.o. female comes to urgent care for STD screening.  Patient has no symptoms at this time.  Her partner also got tested for STD recently but does not have any results yet.Marland Kitchen   HPI  Past Medical History:  Diagnosis Date  . 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 Benign colposcopy in 2015.  Marland Kitchen Herpes    Reports being told she had herpes, no lesions    Patient Active Problem List   Diagnosis Date Noted  . Low grade squamous intraepithelial lesion (LGSIL) on cervical Pap smear 07/21/2015    Past Surgical History:  Procedure Laterality Date  . EYE SURGERY    . EYE SURGERY    . TOOTH EXTRACTION      OB History    Gravida  3   Para  1   Term  1   Preterm      AB  2   Living  1     SAB  1   TAB  1   Ectopic      Multiple      Live Births  1            Home Medications    Prior to Admission medications   Medication Sig Start Date End Date Taking? Authorizing Provider  etonogestrel (NEXPLANON) 68 MG IMPL implant 1 each by Subdermal route once. Inserted 05/2016  01/14/19  [provider]    Family History Family History  Problem Relation Age of Onset  . Diabetes Mother   . Hypertension Mother   . Diabetes Father     Social History Social History   Tobacco Use  . Smoking status: Never Smoker  . Smokeless tobacco: Never Used  Substance Use Topics  . Alcohol use: Not Currently  . Drug use: No     Allergies   Patient has no known allergies.   Review of Systems Review of Systems  HENT: Negative.   Genitourinary: Negative for dyspareunia, dysuria, frequency, pelvic pain, vaginal discharge and vaginal pain.     Physical Exam Triage Vital Signs ED Triage Vitals  Enc  Vitals Group     BP 05/23/19 1829 125/87     Pulse Rate 05/23/19 1829 85     Resp 05/23/19 1829 19     Temp 05/23/19 1829 98.9 F (37.2 C)     Temp Source 05/23/19 1829 Oral     SpO2 05/23/19 1829 97 %     Weight --      Height --      Head Circumference --      Peak Flow --      Pain Score 05/23/19 1827 0     Pain Loc --      Pain Edu? --      Excl. in [place]? --    No data found.  Updated Vital Signs BP 125/87 (BP Location: Right Arm)   Pulse 85   Temp 98.9 F (37.2 C) (Oral)   Resp 19   LMP 05/10/2019   SpO2 97%   Breastfeeding No   Visual Acuity Right Eye Distance:   Left Eye Distance:   Bilateral Distance:    Right Eye Near:  Left Eye Near:    Bilateral Near:     Physical Exam Vitals and nursing note reviewed.  Neurological:     General: No focal deficit present.     Mental Status: She is alert and oriented to person, place, and time.      UC Treatments / Results  Labs (all labs ordered are listed, but only abnormal results are displayed) Labs Reviewed  CERVICOVAGINAL ANCILLARY ONLY    EKG   Radiology No results found.  Procedures Procedures (including critical care time)  Medications Ordered in UC Medications - No data to display  Initial Impression / Assessment and Plan / UC Course  I have reviewed the triage vital signs and the nursing notes.  Pertinent labs & imaging results that were available during my care of the patient were reviewed by me and considered in my medical decision making (see chart for details).     1.  Screen for STDs: Cervicovaginal swab for GC/chlamydia/trichomonas/bacterial vaginosis/vaginal yeast. We will call patient with the STD results No medications given at this time Return precautions given. Final Clinical Impressions(s) / UC Diagnoses   Final diagnoses:  Screen for STD (sexually transmitted disease)   Discharge Instructions   None    ED Prescriptions    None     PDMP not reviewed this  encounter.   Chase Picket, MD 05/23/19 2008

## 2019-05-24 LAB — CERVICOVAGINAL ANCILLARY ONLY
Bacterial Vaginitis (gardnerella): POSITIVE — AB
Candida Glabrata: NEGATIVE
Candida Vaginitis: NEGATIVE
Chlamydia: NEGATIVE
Comment: NEGATIVE
Comment: NEGATIVE
Comment: NEGATIVE
Comment: NEGATIVE
Comment: NEGATIVE
Comment: NORMAL
Neisseria Gonorrhea: POSITIVE — AB
Trichomonas: POSITIVE — AB

## 2019-05-25 ENCOUNTER — Ambulatory Visit (HOSPITAL_COMMUNITY)
Admission: EM | Admit: 2019-05-25 | Discharge: 2019-05-25 | Disposition: A | Discharge status: Home / Self Care | Payer: Medicaid Other | Attending: Family Medicine | Admitting: Family Medicine

## 2019-05-25 ENCOUNTER — Encounter (HOSPITAL_COMMUNITY): Payer: Self-pay

## 2019-05-25 DIAGNOSIS — A549 Gonococcal infection, unspecified: Secondary | ICD-10-CM | POA: Diagnosis not present

## 2019-05-25 DIAGNOSIS — N760 Acute vaginitis: Secondary | ICD-10-CM

## 2019-05-25 DIAGNOSIS — A599 Trichomoniasis, unspecified: Secondary | ICD-10-CM

## 2019-05-25 MED ORDER — METRONIDAZOLE 500 MG PO TABS: 500.0000 mg | ORAL_TABLET | Freq: Two times a day (BID) | ORAL | 0 refills | Status: DC

## 2019-05-25 MED ORDER — LIDOCAINE HCL (PF) 1 % IJ SOLN
INTRAMUSCULAR | Status: AC
  Filled 2019-05-25: qty 2

## 2019-05-25 MED ORDER — CEFTRIAXONE SODIUM 500 MG IJ SOLR
500.0000 mg | Freq: Once | INTRAMUSCULAR | Status: AC
  Administered 2019-05-25: 500 mg via INTRAMUSCULAR

## 2019-05-25 MED ORDER — CEFTRIAXONE SODIUM 500 MG IJ SOLR
INTRAMUSCULAR | Status: AC
  Filled 2019-05-25: qty 500

## 2019-05-25 NOTE — ED Triage Notes (Signed)
Pt presents for STD's treatment.  

## 2019-08-21 ENCOUNTER — Encounter (HOSPITAL_COMMUNITY): Payer: Self-pay

## 2019-08-21 ENCOUNTER — Ambulatory Visit (HOSPITAL_COMMUNITY)
Admission: EM | Admit: 2019-08-21 | Discharge: 2019-08-21 | Disposition: A | Discharge status: Home / Self Care | Payer: Medicaid Other | Attending: Urgent Care | Admitting: Urgent Care

## 2019-08-21 ENCOUNTER — Other Ambulatory Visit: Payer: Self-pay

## 2019-08-21 DIAGNOSIS — L299 Pruritus, unspecified: Secondary | ICD-10-CM

## 2019-08-21 DIAGNOSIS — H6983 Other specified disorders of Eustachian tube, bilateral: Secondary | ICD-10-CM | POA: Diagnosis not present

## 2019-08-21 MED ORDER — CETIRIZINE HCL 10 MG PO TABS: 10.0000 mg | ORAL_TABLET | Freq: Every day | ORAL | 0 refills | Status: DC

## 2019-08-21 MED ORDER — PSEUDOEPHEDRINE HCL 60 MG PO TABS: 60.0000 mg | ORAL_TABLET | Freq: Three times a day (TID) | ORAL | 0 refills | Status: DC | PRN

## 2019-08-21 NOTE — Discharge Instructions (Signed)
Take Zyrtec daily. Use Sudafed as needed when your ears are full, popping, itchy.

## 2019-08-21 NOTE — ED Triage Notes (Signed)
Pt reports itchy and clogged ears x 1 week. Denies fever, chills.

## 2019-08-21 NOTE — ED Provider Notes (Signed)
[facility]   MRN: [id number] DOB: [birth year]  Subjective:   Diana Jacobs is a 29 y.o. female presenting for 1 week history of bilateral itchy ears, ear fullness.  Left side itches more than the right.  Denies fever, runny or stuffy nose, cough, sore throat, chest pain, shortness of breath, nausea, vomiting, belly pain, loss of sense of taste and smell.  Patient has already gotten her vaccine and even prior to that had COVID-19 in January.  Denies taking chronic medications.  No Known Allergies  Past Medical History:  Diagnosis Date   ASCUS with positive high risk HPV cervical pap smears    04/17/2013, 10/22/2013 and 06/25/2014. Benign colposcopy in 2015.   Herpes    Reports being told she had herpes, no lesions     Past Surgical History:  Procedure Laterality Date   EYE SURGERY     EYE SURGERY     TOOTH EXTRACTION      Family History  Problem Relation Age of Onset   Diabetes Mother    Hypertension Mother    Diabetes Father     Social History   Tobacco Use   Smoking status: Never Smoker   Smokeless tobacco: Never Used  Vaping Use   Vaping Use: Never used  Substance Use Topics   Alcohol use: Not Currently   Drug use: No    ROS   Objective:   Vitals: BP 134/80 (BP Location: Right Arm)    Pulse 89    Temp 98.4 F (36.9 C) (Oral)    Resp 20    LMP 08/11/2019 (Exact Date)    SpO2 97%   Physical Exam Constitutional:      General: She is not in acute distress.    Appearance: She is well-developed. She is not ill-appearing.  HENT:     Head: Normocephalic and atraumatic.     Right Ear: Ear canal and external ear normal. No drainage or tenderness. No middle ear effusion. There is no impacted cerumen. Tympanic membrane is not erythematous.     Left Ear: Ear canal and external ear normal. No drainage or tenderness.  No middle ear effusion. There is no impacted cerumen. Tympanic membrane is not erythematous.     Ears:      Comments: TMs opacified bilaterally, ceruminous left ear canal.    Nose: No congestion or rhinorrhea.     Mouth/Throat:     Mouth: Mucous membranes are moist. No oral lesions.     Pharynx: Oropharynx is clear. No pharyngeal swelling, oropharyngeal exudate, posterior oropharyngeal erythema or uvula swelling.     Tonsils: No tonsillar exudate or tonsillar abscesses.  Eyes:     General: No scleral icterus.       Right eye: No discharge.        Left eye: No discharge.     Extraocular Movements: Extraocular movements intact.     Right eye: Normal extraocular motion.     Left eye: Normal extraocular motion.     Conjunctiva/sclera: Conjunctivae normal.     Pupils: Pupils are equal, round, and reactive to light.  Cardiovascular:     Rate and Rhythm: Normal rate.  Pulmonary:     Effort: Pulmonary effort is normal.  Musculoskeletal:     Cervical back: Normal range of motion and neck supple.  Lymphadenopathy:     Cervical: No cervical adenopathy.  Skin:    General: Skin is warm and dry.  Neurological:     General: No focal deficit present.  Mental Status: She is alert and oriented to person, place, and time.  Psychiatric:        Mood and Affect: Mood normal.        Behavior: Behavior normal.        Thought Content: Thought content normal.        Judgment: Judgment normal.      Assessment and Plan :   PDMP not reviewed this encounter.  1. Eustachian tube dysfunction, bilateral   2. Itching of ear     Counseled on general management of ETD, start Zyrtec, pseudoephedrine.  Low suspicion for COVID-19.  Given lack of respiratory symptoms and overall symptoms consistent with COVID-19 will defer testing especially in light of her history of having had COVID-19 already in vaccination recently. Counseled patient on potential for adverse effects with medications prescribed/recommended today, ER and return-to-clinic precautions discussed, patient verbalized understanding.    Wallis Bamberg,  [place] 08/21/19 [phone]

## 2019-09-21 ENCOUNTER — Encounter (HOSPITAL_COMMUNITY): Payer: Self-pay | Admitting: Emergency Medicine

## 2019-09-21 ENCOUNTER — Other Ambulatory Visit: Payer: Self-pay

## 2019-09-21 ENCOUNTER — Ambulatory Visit (HOSPITAL_COMMUNITY)
Admission: EM | Admit: 2019-09-21 | Discharge: 2019-09-21 | Disposition: A | Discharge status: Home / Self Care | Payer: Medicaid Other | Attending: Emergency Medicine | Admitting: Emergency Medicine

## 2019-09-21 DIAGNOSIS — R05 Cough: Secondary | ICD-10-CM | POA: Diagnosis not present

## 2019-09-21 DIAGNOSIS — R52 Pain, unspecified: Secondary | ICD-10-CM | POA: Diagnosis not present

## 2019-09-21 DIAGNOSIS — J069 Acute upper respiratory infection, unspecified: Secondary | ICD-10-CM | POA: Insufficient documentation

## 2019-09-21 DIAGNOSIS — Z20822 Contact with and (suspected) exposure to covid-19: Secondary | ICD-10-CM | POA: Diagnosis not present

## 2019-09-21 LAB — SARS CORONAVIRUS 2 (TAT 6-24 HRS): SARS Coronavirus 2: NEGATIVE

## 2019-09-21 MED ORDER — DM-GUAIFENESIN ER 30-600 MG PO TB12: 1.0000 | ORAL_TABLET | Freq: Two times a day (BID) | ORAL | 0 refills | Status: DC

## 2019-09-21 MED ORDER — BENZONATATE 200 MG PO CAPS: 200.0000 mg | ORAL_CAPSULE | Freq: Three times a day (TID) | ORAL | 0 refills | Status: AC | PRN

## 2019-09-21 NOTE — Discharge Instructions (Signed)
COVID test pending Ibuprofen and tylenol for fever, body aches Tessalon for cough, Mucinex DM for congestion and cough Rest and fluids Follow up if not improving or worsening

## 2019-09-21 NOTE — ED Provider Notes (Signed)
[facility]    CSN: [id number] Arrival date & time: 09/21/19  1113      History   Chief Complaint Chief Complaint  Patient presents with  . Generalized Body Aches    HPI Diana Jacobs is a 29 y.o. female presenting today for evaluation of body aches.  Patient reports over the past 1 to 2 days she has developed fevers, chills, body aches and generalized fatigue.  She has had poor appetite as well as loss of taste and smell.  Has also had URI symptoms of cough congestion and sore throat.  Denies chest pain or shortness of breath.  HPI  Past Medical History:  Diagnosis Date  . 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 Benign colposcopy in 2015.  Marland Kitchen Herpes    Reports being told she had herpes, no lesions    Patient Active Problem List   Diagnosis Date Noted  . Low grade squamous intraepithelial lesion (LGSIL) on cervical Pap smear 07/21/2015    Past Surgical History:  Procedure Laterality Date  . EYE SURGERY    . EYE SURGERY    . TOOTH EXTRACTION      OB History    Gravida  3   Para  1   Term  1   Preterm      AB  2   Living  1     SAB  1   TAB  1   Ectopic      Multiple      Live Births  1            Home Medications    Prior to Admission medications   Medication Sig Start Date End Date Taking? Authorizing Provider  benzonatate (TESSALON) 200 MG capsule Take 1 capsule (200 mg total) by mouth 3 (three) times daily as needed for up to 7 days for cough. 09/21/19 09/28/19  Keeara Frees C, PA-C  dextromethorphan-guaiFENesin (MUCINEX DM) 30-600 MG 12hr tablet Take 1 tablet by mouth 2 (two) times daily. 09/21/19   Delaina Fetsch C, PA-C  cetirizine (ZYRTEC ALLERGY) 10 MG tablet Take 1 tablet (10 mg total) by mouth daily. 08/21/19 09/21/19  Wallis Bamberg, PA-C  etonogestrel (NEXPLANON) 68 MG IMPL implant 1 each by Subdermal route once. Inserted 05/2016  01/14/19  [provider]    Family History Family History  Problem Relation Age of Onset  . Diabetes Mother   . Hypertension Mother   . Diabetes Father     Social History Social History   Tobacco Use  . Smoking status: Never Smoker  . Smokeless tobacco: Never Used  Vaping Use  . Vaping Use: Never used  Substance Use Topics  . Alcohol use: Not Currently  . Drug use: No     Allergies   Patient has no known allergies.   Review of Systems Review of Systems  Constitutional: Negative for activity change, appetite change, chills, fatigue and fever.  HENT: Positive for congestion, rhinorrhea, sinus pressure and sore throat. Negative for ear pain and trouble swallowing.   Eyes: Negative for discharge and redness.  Respiratory: Positive for cough. Negative for chest tightness and shortness of breath.   Cardiovascular: Negative for chest pain.  Gastrointestinal: Negative for abdominal pain, diarrhea, nausea and vomiting.  Musculoskeletal: Negative for myalgias.  Skin: Negative for rash.  Neurological: Negative for dizziness, light-headedness and headaches.     Physical Exam Triage Vital Signs ED Triage Vitals  Enc Vitals Group     BP 09/21/19 1332 132/71     Pulse Rate 09/21/19 1332 78     Resp 09/21/19 1332 20     Temp 09/21/19 1332 98.4 F (36.9 C)     Temp Source 09/21/19 1332 Oral     SpO2 09/21/19 1332 96 %     Weight --      Height --      Head Circumference --      Peak Flow --      Pain Score 09/21/19 1330 3     Pain Loc --      Pain Edu? --      Excl. in GC? --    No data found.  Updated Vital Signs BP 132/71 (BP Location: Left Arm) Comment (BP Location): large cuff  Pulse 78   Temp 98.4 F (36.9 C) (Oral)   Resp 20   LMP 09/11/2019   SpO2 96%   Visual Acuity Right Eye Distance:   Left Eye Distance:   Bilateral Distance:    Right Eye Near:   Left Eye Near:    Bilateral Near:     Physical Exam Vitals and nursing note reviewed.  Constitutional:        Appearance: She is well-developed.     Comments: No acute distress  HENT:     Head: Normocephalic and atraumatic.     Ears:     Comments: Bilateral ears without tenderness to palpation of external auricle, tragus and mastoid, EAC's without erythema or swelling, TM's with good bony landmarks and cone of light. Non erythematous.     Nose: Nose normal.     Mouth/Throat:     Comments: Oral mucosa pink and moist, no tonsillar enlargement or exudate. Posterior pharynx patent and nonerythematous, no uvula deviation or swelling. Normal phonation. Eyes:     Conjunctiva/sclera: Conjunctivae normal.  Cardiovascular:     Rate and Rhythm: Normal rate.  Pulmonary:     Effort: Pulmonary effort is normal. No respiratory distress.     Comments: Breathing comfortably at rest, CTABL, no wheezing, rales or other adventitious sounds auscultated Abdominal:     General: There is no distension.  Musculoskeletal:        General: Normal range of motion.     Cervical back: Neck supple.  Skin:    General: Skin is warm and dry.  Neurological:     Mental Status: She is alert and oriented to person, place, and time.      UC Treatments / Results  Labs (all labs ordered are listed, but only abnormal results are displayed) Labs Reviewed  SARS CORONAVIRUS 2 (TAT 6-24 HRS)    EKG   Radiology No results found.  Procedures Procedures (including critical care time)  Medications Ordered in UC Medications - No data to display  Initial Impression / Assessment and Plan / UC Course  I have reviewed the triage vital signs and the nursing notes.  Pertinent labs & imaging results that were available during my care of the patient were reviewed by me and considered in my medical decision making (see chart for details).     Viral URI-symptoms x2 days, exam reassuring, Covid PCR pending.  Recommending continued symptomatic and supportive care.  Rest and fluids.  Discussed strict return precautions.  Patient verbalized understanding and is agreeable with plan.  Final Clinical Impressions(s) / UC Diagnoses   Final diagnoses:  Viral URI with cough     Discharge Instructions  COVID test pending Ibuprofen and tylenol for fever, body aches Tessalon for cough, Mucinex DM for congestion and cough Rest and fluids Follow up if not improving or worsening     ED Prescriptions    Medication Sig Dispense Auth. Provider   benzonatate (TESSALON) 200 MG capsule Take 1 capsule (200 mg total) by mouth 3 (three) times daily as needed for up to 7 days for cough. 28 capsule Emari Hreha C, PA-C   dextromethorphan-guaiFENesin (MUCINEX DM) 30-600 MG 12hr tablet Take 1 tablet by mouth 2 (two) times daily. 20 tablet Antonia Culbertson, [place] C, PA-C     PDMP not reviewed this encounter.   Lew Dawes, [place] 09/23/19 1851

## 2019-09-21 NOTE — ED Triage Notes (Signed)
Patient noticed symptoms yesterday morning, worsened last night:fever, chills sweating, body soreness, weakness, and no appetite  No change in taste or smell

## 2019-11-04 ENCOUNTER — Other Ambulatory Visit: Payer: Self-pay

## 2019-11-04 ENCOUNTER — Emergency Department (HOSPITAL_COMMUNITY): Payer: Medicaid Other

## 2019-11-04 ENCOUNTER — Emergency Department (HOSPITAL_COMMUNITY)
Admission: EM | Admit: 2019-11-04 | Discharge: 2019-11-05 | Disposition: A | Discharge status: Home / Self Care | Payer: Medicaid Other | Attending: Emergency Medicine | Admitting: Emergency Medicine

## 2019-11-04 ENCOUNTER — Encounter (HOSPITAL_COMMUNITY): Payer: Self-pay | Admitting: Emergency Medicine

## 2019-11-04 DIAGNOSIS — Y9301 Activity, walking, marching and hiking: Secondary | ICD-10-CM | POA: Insufficient documentation

## 2019-11-04 DIAGNOSIS — W2201XA Walked into wall, initial encounter: Secondary | ICD-10-CM | POA: Insufficient documentation

## 2019-11-04 DIAGNOSIS — Y92099 Unspecified place in other non-institutional residence as the place of occurrence of the external cause: Secondary | ICD-10-CM | POA: Diagnosis not present

## 2019-11-04 DIAGNOSIS — R55 Syncope and collapse: Secondary | ICD-10-CM | POA: Diagnosis not present

## 2019-11-04 DIAGNOSIS — S098XXA Other specified injuries of head, initial encounter: Secondary | ICD-10-CM | POA: Diagnosis not present

## 2019-11-04 DIAGNOSIS — S0003XA Contusion of scalp, initial encounter: Secondary | ICD-10-CM | POA: Diagnosis not present

## 2019-11-04 DIAGNOSIS — R519 Headache, unspecified: Secondary | ICD-10-CM

## 2019-11-04 DIAGNOSIS — S0990XA Unspecified injury of head, initial encounter: Secondary | ICD-10-CM

## 2019-11-04 DIAGNOSIS — S060X1A Concussion with loss of consciousness of 30 minutes or less, initial encounter: Secondary | ICD-10-CM | POA: Diagnosis not present

## 2019-11-04 MED ORDER — ACETAMINOPHEN 325 MG PO TABS
650.0000 mg | ORAL_TABLET | Freq: Once | ORAL | Status: AC
  Administered 2019-11-04: 650 mg via ORAL
  Filled 2019-11-04: qty 2

## 2019-11-04 NOTE — ED Triage Notes (Signed)
Pt reports she ran into a wall just PTA, pt does not recall events, was told by boyfriend and his children what occurred. Pt states she remembers sitting at the table then woke up on the floor. Hematoma with abrasion noted to R forehead, abrasion to R upper lip. Pt c/o pain to entire R side of face.  Pt A & O at this time.

## 2019-11-05 ENCOUNTER — Other Ambulatory Visit: Payer: Self-pay

## 2019-11-05 DIAGNOSIS — R55 Syncope and collapse: Secondary | ICD-10-CM | POA: Diagnosis not present

## 2019-11-05 DIAGNOSIS — S0990XA Unspecified injury of head, initial encounter: Secondary | ICD-10-CM | POA: Diagnosis not present

## 2019-11-05 DIAGNOSIS — R519 Headache, unspecified: Secondary | ICD-10-CM | POA: Diagnosis not present

## 2019-11-05 DIAGNOSIS — S060X1A Concussion with loss of consciousness of 30 minutes or less, initial encounter: Secondary | ICD-10-CM | POA: Diagnosis not present

## 2019-11-05 LAB — COMPREHENSIVE METABOLIC PANEL
ALT: 16 U/L (ref 0–44)
AST: 21 U/L (ref 15–41)
Albumin: 3.7 g/dL (ref 3.5–5.0)
Alkaline Phosphatase: 67 U/L (ref 38–126)
Anion gap: 13 (ref 5–15)
BUN: 7 mg/dL (ref 6–20)
CO2: 24 mmol/L (ref 22–32)
Calcium: 9.3 mg/dL (ref 8.9–10.3)
Chloride: 100 mmol/L (ref 98–111)
Creatinine, Ser: 0.91 mg/dL (ref 0.44–1.00)
GFR, Estimated: >60 mL/min (ref >60)
Glucose, Bld: 97 mg/dL (ref 70–99)
Potassium: 3.9 mmol/L (ref 3.5–5.1)
Sodium: 137 mmol/L (ref 135–145)
Total Bilirubin: 0.6 mg/dL (ref 0.3–1.2)
Total Protein: 7.7 g/dL (ref 6.5–8.1)

## 2019-11-05 LAB — CBC WITH DIFFERENTIAL/PLATELET
Abs Immature Granulocytes: 0.01 10*3/uL (ref 0.00–0.07)
Basophils Absolute: 0 10*3/uL (ref 0.0–0.1)
Basophils Relative: 0 %
Eosinophils Absolute: 0.1 10*3/uL (ref 0.0–0.5)
Eosinophils Relative: 2 %
HCT: 38.6 % (ref 36.0–46.0)
Hemoglobin: 12.1 g/dL (ref 12.0–15.0)
Immature Granulocytes: 0 %
Lymphocytes Relative: 47 %
Lymphs Abs: 2.3 10*3/uL (ref 0.7–4.0)
MCH: 27.4 pg (ref 26.0–34.0)
MCHC: 31.3 g/dL (ref 30.0–36.0)
MCV: 87.5 fL (ref 80.0–100.0)
Monocytes Absolute: 0.4 10*3/uL (ref 0.1–1.0)
Monocytes Relative: 9 %
Neutro Abs: 2.1 10*3/uL (ref 1.7–7.7)
Neutrophils Relative %: 42 %
Platelets: 361 10*3/uL (ref 150–400)
RBC: 4.41 MIL/uL (ref 3.87–5.11)
RDW: 13.3 % (ref 11.5–15.5)
WBC: 4.9 10*3/uL (ref 4.0–10.5)
nRBC: 0 % (ref 0.0–0.2)

## 2019-11-05 LAB — I-STAT BETA HCG BLOOD, ED (MC, WL, AP ONLY): I-stat hCG, quantitative: <5 m[IU]/mL (ref <5)

## 2019-11-05 LAB — MAGNESIUM: Magnesium: 2 mg/dL (ref 1.7–2.4)

## 2019-11-05 MED ORDER — PROCHLORPERAZINE EDISYLATE 10 MG/2ML IJ SOLN
10.0000 mg | Freq: Once | INTRAMUSCULAR | Status: AC
  Administered 2019-11-05: 10 mg via INTRAVENOUS
  Filled 2019-11-05: qty 2

## 2019-11-05 MED ORDER — SODIUM CHLORIDE 0.9 % IV BOLUS
1000.0000 mL | Freq: Once | INTRAVENOUS | Status: AC
  Administered 2019-11-05: 1000 mL via INTRAVENOUS

## 2019-11-05 MED ORDER — DIPHENHYDRAMINE HCL 50 MG/ML IJ SOLN
25.0000 mg | Freq: Once | INTRAMUSCULAR | Status: AC
  Administered 2019-11-05: 25 mg via INTRAVENOUS
  Filled 2019-11-05: qty 1

## 2019-11-05 NOTE — ED Notes (Signed)
WALKED PATIENT TO THE BATHROOM PATIENT DID WELL 

## 2019-11-05 NOTE — ED Provider Notes (Signed)
[facility]ENC[facility]MENT Provider Note   CSN: [id number] Arrival date & time: 11/04/19  2108     History Chief Complaint  Patient presents with  . Head Injury    Real ConsMercedes Gavaughn Jacobs is a 29 y.o. female.  The history is provided by the patient and medical records. No language interpreter was used.  Head Injury Location:  Frontal Time since incident:  12 hours Mechanism of injury: fall   Fall:    Fall occurred:  Standing   Impact surface:  Wall   Point of impact:  Face   Entrapped after fall: no   Pain details:    Quality:  Aching Chronicity:  New Relieved by:  Nothing Worsened by:  Nothing Ineffective treatments:  None tried Associated symptoms: headache   Associated symptoms: no blurred vision, no difficulty breathing, no double vision, no focal weakness, no hearing loss, no loss of consciousness, no memory loss, no nausea, no neck pain, no numbness and no vomiting        Past Medical History:  Diagnosis Date  . 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 Benign colposcopy in 2015.  Marland Kitchen. Herpes    Reports being told she had herpes, no lesions    Patient Active Problem List   Diagnosis Date Noted  . Low grade squamous intraepithelial lesion (LGSIL) on cervical Pap smear 07/21/2015    Past Surgical History:  Procedure Laterality Date  . EYE SURGERY    . EYE SURGERY    . TOOTH EXTRACTION       OB History    Gravida  3   Para  1   Term  1   Preterm      AB  2   Living  1     SAB  1   TAB  1   Ectopic      Multiple      Live Births  1           Family History  Problem Relation Age of Onset  . Diabetes Mother   . Hypertension Mother   . Diabetes Father     Social History   Tobacco Use  . Smoking status: Never Smoker  . Smokeless tobacco: Never Used  Vaping Use  . Vaping Use: Never used  Substance Use Topics  . Alcohol use: Not Currently  .  Drug use: No    Home Medications Prior to Admission medications   Medication Sig Start Date End Date Taking? Authorizing Provider  dextromethorphan-guaiFENesin (MUCINEX DM) 30-600 MG 12hr tablet Take 1 tablet by mouth 2 (two) times daily. 09/21/19   Wieters, Hallie C, PA-C  cetirizine (ZYRTEC ALLERGY) 10 MG tablet Take 1 tablet (10 mg total) by mouth daily. 08/21/19 09/21/19  Wallis BambergMani, Mario, PA-C  etonogestrel (NEXPLANON) 68 MG IMPL implant 1 each by Subdermal route once. Inserted 05/2016  01/14/19  [provider]    Allergies    Patient has no known allergies.  Review of Systems   Review of Systems  Constitutional: Negative for chills, fatigue and fever.  HENT: Negative for congestion, ear pain and hearing loss.   Eyes: Negative for blurred vision, double vision and visual disturbance.  Respiratory: Negative for cough, chest tightness and shortness of breath.   Cardiovascular: Negative for chest pain and palpitations.  Gastrointestinal: Negative for constipation, diarrhea, nausea and vomiting.  Genitourinary: Negative for flank pain.  Musculoskeletal: Negative for back pain, neck  pain and neck stiffness.  Neurological: Positive for syncope and headaches. Negative for dizziness, focal weakness, loss of consciousness, speech difficulty, weakness, light-headedness and numbness.  Psychiatric/Behavioral: Negative for agitation, confusion and memory loss.  All other systems reviewed and are negative.   Physical Exam Updated Vital Signs BP 127/83   Pulse 69   Temp 98.7 F (37.1 C) (Oral)   Resp 13   LMP 10/11/2019   SpO2 100%   Physical Exam Vitals and nursing note reviewed.  Constitutional:      General: She is not in acute distress.    Appearance: She is well-developed. She is not ill-appearing, toxic-appearing or diaphoretic.  HENT:     Head: Normocephalic. Contusion present. No laceration.      Comments: Hematoma and abrasion to face.  No nasal septal hematoma.  Normal  extraocular movement.  Pupils are symmetric reactive normal extraocular movements.  No bleeding seen.  Oropharyngeal exam unremarkable.    Right Ear: External ear normal.     Left Ear: External ear normal.     Nose: Nose normal. No congestion or rhinorrhea.     Mouth/Throat:     Mouth: Mucous membranes are moist.     Pharynx: No oropharyngeal exudate or posterior oropharyngeal erythema.  Eyes:     Conjunctiva/sclera: Conjunctivae normal.     Pupils: Pupils are equal, round, and reactive to light.  Cardiovascular:     Rate and Rhythm: Normal rate.     Pulses: Normal pulses.     Heart sounds: No murmur heard.   Pulmonary:     Effort: No respiratory distress.     Breath sounds: No stridor. No wheezing, rhonchi or rales.  Chest:     Chest wall: No tenderness.  Abdominal:     General: There is no distension.     Tenderness: There is no abdominal tenderness. There is no right CVA tenderness, left CVA tenderness or rebound.  Musculoskeletal:        General: No tenderness.     Cervical back: Normal range of motion and neck supple. No tenderness.  Skin:    General: Skin is warm.     Capillary Refill: Capillary refill takes less than 2 seconds.     Coloration: Skin is not pale.     Findings: No erythema or rash.  Neurological:     Mental Status: She is alert and oriented to person, place, and time.     Sensory: No sensory deficit.     Motor: No weakness or abnormal muscle tone.     Coordination: Coordination normal.     Deep Tendon Reflexes: Reflexes are normal and symmetric.  Psychiatric:        Mood and Affect: Mood normal.     ED Results / Procedures / Treatments   Labs (all labs ordered are listed, but only abnormal results are displayed) Labs Reviewed  CBC WITH DIFFERENTIAL/PLATELET  COMPREHENSIVE METABOLIC PANEL  MAGNESIUM  I-STAT BETA HCG BLOOD, ED ([facility], [facility], [facility] ONLY)    EKG EKG Interpretation  Date/Time:  Tuesday November 05 2019 08:46:47 EDT Ventricular Rate:   74 PR Interval:    QRS Duration: 100 QT Interval:  389 QTC Calculation: 432 R Axis:   65 Text Interpretation: Sinus rhythm No prior ECG for comparison. No STEMI Confirmed by Theda Belfast ([id number]) on 11/05/2019 9:16:44 AM   Radiology CT Head Wo Contrast  Result Date: 11/04/2019 CLINICAL DATA:  Ran into a wall, scalp hematoma and abrasion, right-sided facial pain EXAM: CT  HEAD WITHOUT CONTRAST CT MAXILLOFACIAL WITHOUT CONTRAST TECHNIQUE: Multidetector CT imaging of the head and maxillofacial structures were performed using the standard protocol without intravenous contrast. Multiplanar CT image reconstructions of the maxillofacial structures were also generated. COMPARISON:  None. FINDINGS: CT HEAD FINDINGS Brain: No acute infarct or hemorrhage. Lateral ventricles and midline structures are unremarkable. No acute extra-axial fluid collections. No mass effect. Vascular: No hyperdense vessel or unexpected calcification. Skull: There is a right supraorbital scalp hematoma. No underlying fracture. Other: None. CT MAXILLOFACIAL FINDINGS Osseous: No fracture or mandibular dislocation. No destructive process. Orbits: Negative. No traumatic or inflammatory finding. Sinuses: Polypoid mucosal thickening seen within the maxillary sinuses. Remaining paranasal sinuses are clear. Soft tissues: Right supraorbital soft tissue swelling is noted. No underlying fracture. IMPRESSION: 1. Right supraorbital scalp hematoma. 2. No acute facial bone fracture. 3. No acute intracranial process. Electronically Signed   By: Sharlet Salina M.D.   On: 11/04/2019 23:22   CT Maxillofacial Wo Contrast  Result Date: 11/04/2019 CLINICAL DATA:  Ran into a wall, scalp hematoma and abrasion, right-sided facial pain EXAM: CT HEAD WITHOUT CONTRAST CT MAXILLOFACIAL WITHOUT CONTRAST TECHNIQUE: Multidetector CT imaging of the head and maxillofacial structures were performed using the standard protocol without intravenous contrast. Multiplanar  CT image reconstructions of the maxillofacial structures were also generated. COMPARISON:  None. FINDINGS: CT HEAD FINDINGS Brain: No acute infarct or hemorrhage. Lateral ventricles and midline structures are unremarkable. No acute extra-axial fluid collections. No mass effect. Vascular: No hyperdense vessel or unexpected calcification. Skull: There is a right supraorbital scalp hematoma. No underlying fracture. Other: None. CT MAXILLOFACIAL FINDINGS Osseous: No fracture or mandibular dislocation. No destructive process. Orbits: Negative. No traumatic or inflammatory finding. Sinuses: Polypoid mucosal thickening seen within the maxillary sinuses. Remaining paranasal sinuses are clear. Soft tissues: Right supraorbital soft tissue swelling is noted. No underlying fracture. IMPRESSION: 1. Right supraorbital scalp hematoma. 2. No acute facial bone fracture. 3. No acute intracranial process. Electronically Signed   By: Sharlet Salina M.D.   On: 11/04/2019 23:22    Procedures Procedures (including critical care time)  Medications Ordered in ED Medications  acetaminophen (TYLENOL) tablet 650 mg (650 mg Oral Given 11/04/19 2156)  sodium chloride 0.9 % bolus 1,000 mL (1,000 mLs Intravenous New Bag/Given 11/05/19 0856)  prochlorperazine (COMPAZINE) injection 10 mg (10 mg Intravenous Given 11/05/19 0904)  diphenhydrAMINE (BENADRYL) injection 25 mg (25 mg Intravenous Given 11/05/19 1308)    ED Course  I have reviewed the triage vital signs and the nursing notes.  Pertinent labs & imaging results that were available during my care of the patient were reviewed by me and considered in my medical decision making (see chart for details).    MDM Rules/Calculators/A&P                          Ladavia Lindenbaum is a 29 y.o. female with no significant past medical history who presents with syncope and head injury.  Patient reports that yesterday afternoon, she was sitting at a table and laughing hysterically.   She remembers standing up the next thing she knows she woke up hitting her head on the floor.  She sustained injury to her right forehead, abrasion to her right lip.  She is reporting pain in her face and severe headache.  She reports she had no other preceding symptoms and has done well otherwise.  She denies any nausea, vomiting, constipation, diarrhea, urinary symptoms.  She denies recent  fevers, chills, neck pain, neck stiffness.  No previous head traumas.  No previous syncopal episodes.  She reports it did happen right after he stood up.  On exam, lungs are clear and chest is nontender.  Abdomen is nontender.  No murmur appreciated.  Good pulses in extremities.  No focal neurologic deficits.  Normal sensation and strength in extremities.  Normal finger-nose-finger testing.  Neck is nontender with normal range of motion.  Patient has abrasion and hematoma to her right forehead and abrasion on her upper lip.  No nasal septal hematoma seen.  Unremarkable oropharyngeal exam.  No other lacerations or bleeding seen.  Patient had CT imaging while in the waiting room as she waited approximately 11 hours prior to my initial evaluation.  CT head and face did not show any acute fractures and only showed soft tissue hematoma and injury.   Further discussion with the patient reveals her headache is still "a 20 out of 10".  As you know there is no serious intracranial injury, I suspect she has concussion.  We will give her headache cocktail and will also get screening blood work to look for anemia or electrolyte imbalance and an EKG to make sure there are no other concerning causes of her syncope.  I suspect a vagal episode in this context of standing while laughing hysterically.  Anticipate discharge if patient is feeling better and work-up is reassuring.     10:01 AM Patient's pregnancy test is negative.  CBC is reassuring with no anemia or leukocytosis.  Patient did not want to wait for the metabolic panel  and magnesium to return.  She is feeling much better from a headache standpoint and wants to go home.  I suspect concussion and we discussed outpatient follow-up for her laboratory test to return in for repeat evaluation.  She agrees with plan of care and understands return precautions.  No other questions or concerns and was discharged in good condition.    Final Clinical Impression(s) / ED Diagnoses Final diagnoses:  Concussion with loss of consciousness of 30 minutes or less, initial encounter  Injury of head, initial encounter  Syncope, unspecified syncope type  Nonintractable headache, unspecified chronicity pattern, unspecified headache type    Rx / DC Orders ED Discharge Orders    None     Clinical Impression: 1. Concussion with loss of consciousness of 30 minutes or less, initial encounter   2. Injury of head, initial encounter   3. Syncope, unspecified syncope type   4. Nonintractable headache, unspecified chronicity pattern, unspecified headache type     Disposition: Discharge  Condition: Good  I have discussed the results, Dx and Tx plan with the pt(& family if present). He/she/they expressed understanding and agree(s) with the plan. Discharge instructions discussed at great length. Strict return precautions discussed and pt &/or family have verbalized understanding of the instructions. No further questions at time of discharge.    New Prescriptions   No medications on file    Follow Up: [facility] [address] [place] [address] [phone] Schedule an appointment as soon as possible for a visit    [facility] EMERGENCY DEPARTMENT [address] [id number] mc [place] [address] [phone]       Dajohn Ellender, Canary Brim, MD 11/05/19 1005

## 2019-11-05 NOTE — Discharge Instructions (Signed)
Your work-up today did not show evidence of acute intracranial injury but I do suspect you have a mild concussion from hitting your head and passing out.  Your work-up thus far was reassuring at time of discharge.  The metabolic panel and magnesium were still in process but she did not want to wait for them.  Please follow-up with a primary doctor for these results.  Please rest and push hydration.  Please over-the-counter anti-inflammatory and headache medication such as Motrin and Tylenol.  If any symptoms change or worsen, please return to the nearest emergency department.  Please rest for the next few days.

## 2019-11-05 NOTE — ED Notes (Signed)
Got patient back in bed on the monitor patient is resting with call bell in reach 

## 2019-11-06 ENCOUNTER — Telehealth: Payer: Self-pay | Admitting: *Deleted

## 2019-11-06 ENCOUNTER — Telehealth: Payer: Self-pay

## 2019-11-06 NOTE — Telephone Encounter (Signed)
Called patient to assist with ed follow up at the [facility] ,patient says the location is too far .She does not have transportation and was given the telephone number to 2 practices on [address] .Ms Chenier states she will call and make an appointment. Yiannis Tulloch PEC [phone]

## 2019-11-06 NOTE — Telephone Encounter (Signed)
Transition Care Management Follow-up Telephone Call  Date of discharge and from where: [name] 11/05/2019  How have you been since you were released from the hospital? Doing well  Any questions or concerns? No  Items Reviewed:  Did the pt receive and understand the discharge instructions provided? Yes   Medications obtained and verified? Yes   Other? Yes   Any new allergies since your discharge? No   Dietary orders reviewed? Yes  Do you have support at home? Yes   Home Care and Equipment/Supplies: Were home health services ordered? not applicable If so, what is the name of the agency?   Has the agency set up a time to come to the patient's home? not applicable Were any new equipment or medical supplies ordered?  No What is the name of the medical supply agency?  Were you able to get the supplies/equipment? not applicable Do you have any questions related to the use of the equipment or supplies? No  Functional Questionnaire: (I = Independent and D = Dependent) ADLs: I  Bathing/Dressing- I  Meal Prep- I  Eating- I  Maintaining continence- I  Transferring/Ambulation- I  Managing Meds- I  Follow up appointments reviewed:   PCP Hospital f/u appt confirmed? No  Needs PCP, advised pt she will be contacted.Marland Kitchen  Specialist Hospital f/u appt confirmed? No    Are transportation arrangements needed? No   If their condition worsens, is the pt aware to call PCP or go to the Emergency Dept.? Yes  Was the patient provided with contact information for the PCP's office or ED? Yes  Was to pt encouraged to call back with questions or concerns? Yes

## 2019-11-14 ENCOUNTER — Ambulatory Visit: Payer: Medicaid Other | Admitting: Obstetrics

## 2019-11-14 ENCOUNTER — Other Ambulatory Visit: Payer: Self-pay

## 2019-12-04 ENCOUNTER — Encounter: Payer: Self-pay | Admitting: Obstetrics

## 2019-12-04 ENCOUNTER — Other Ambulatory Visit (HOSPITAL_COMMUNITY)
Admission: RE | Admit: 2019-12-04 | Discharge: 2019-12-04 | Disposition: A | Discharge status: Home / Self Care | Payer: Medicaid Other | Source: Ambulatory Visit | Attending: Obstetrics | Admitting: Obstetrics

## 2019-12-04 ENCOUNTER — Ambulatory Visit (INDEPENDENT_AMBULATORY_CARE_PROVIDER_SITE_OTHER): Payer: Medicaid Other | Admitting: Obstetrics

## 2019-12-04 ENCOUNTER — Other Ambulatory Visit: Payer: Self-pay

## 2019-12-04 VITALS — BP 142/90 | HR 101 | Ht 64.0 in | Wt 284.0 lb

## 2019-12-04 DIAGNOSIS — Z113 Encounter for screening for infections with a predominantly sexual mode of transmission: Secondary | ICD-10-CM | POA: Diagnosis not present

## 2019-12-04 DIAGNOSIS — N898 Other specified noninflammatory disorders of vagina: Secondary | ICD-10-CM

## 2019-12-04 DIAGNOSIS — Z6841 Body Mass Index (BMI) 40.0 and over, adult: Secondary | ICD-10-CM

## 2019-12-04 DIAGNOSIS — Z01419 Encounter for gynecological examination (general) (routine) without abnormal findings: Secondary | ICD-10-CM

## 2019-12-04 NOTE — Progress Notes (Addendum)
Subjective:        Diana Jacobs is a 29 y.o. female here for a routine exam.  Current complaints: Vaginal discharge.    Personal health questionnaire:  Is patient Ashkenazi Jewish, have a family history of breast and/or ovarian cancer: no Is there a family history of uterine cancer diagnosed at age < 38, gastrointestinal cancer, urinary tract cancer, family member who is a Personnel officer syndrome-associated carrier: no Is the patient overweight and hypertensive, family history of diabetes, personal history of gestational diabetes, preeclampsia or PCOS: yes Is patient over 36, have PCOS,  family history of premature CHD under age 69, diabetes, smoke, have hypertension or peripheral artery disease:  no At any time, has a partner hit, kicked or otherwise hurt or frightened you?: no Over the past 2 weeks, have you felt down, depressed or hopeless?: no Over the past 2 weeks, have you felt little interest or pleasure in doing things?:no   Gynecologic History Patient's last menstrual period was 11/13/2019. Contraception: none Last Pap: 11-07-2018. Results were: normal Last mammogram: n/a. Results were: n/a  Obstetric History OB History  Gravida Para Term Preterm AB Living  3 1 1   2 1   SAB TAB Ectopic Multiple Live Births  1 1     1     # Outcome Date GA Lbr Len/2nd Weight Sex Delivery Anes PTL Lv  3 Term 02/28/13 [redacted]w[redacted]d / 01:22 8 lb 4.8 oz (3.765 kg) M Vag-Spont EPI  LIV  2 TAB 2015          1 SAB 02/02/11             Birth Comments: 7wks    Past Medical History:  Diagnosis Date  . 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 Benign colposcopy in 2015.  06/27/2014 Herpes    Reports being told she had herpes, no lesions    Past Surgical History:  Procedure Laterality Date  . EYE SURGERY    . EYE SURGERY    . TOOTH EXTRACTION       Current Outpatient Medications:  .  dextromethorphan-guaiFENesin (MUCINEX DM) 30-600 MG 12hr  tablet, Take 1 tablet by mouth 2 (two) times daily. (Patient not taking: Reported on 12/04/2019), Disp: 20 tablet, Rfl: 0 No Known Allergies  Social History   Tobacco Use  . Smoking status: Never Smoker  . Smokeless tobacco: Never Used  Substance Use Topics  . Alcohol use: Not Currently    Family History  Problem Relation Age of Onset  . Diabetes Mother   . Hypertension Mother   . Diabetes Father       Review of Systems  Constitutional: negative for fatigue and weight loss Respiratory: negative for cough and wheezing Cardiovascular: negative for chest pain, fatigue and palpitations Gastrointestinal: negative for abdominal pain and change in bowel habits Musculoskeletal:negative for myalgias Neurological: negative for gait problems and tremors Behavioral/Psych: negative for abusive relationship, depression Endocrine: negative for temperature intolerance    Genitourinary:negative for abnormal menstrual periods, genital lesions, hot flashes, sexual problems.  Positive for vaginal discharge Integument/breast: negative for breast lump, breast tenderness, nipple discharge and skin lesion(s)    Objective:       BP (!) 142/90   Pulse (!) 101   Ht 5\' 4"  (1.626 m)   Wt 284 lb (128.8 kg)   LMP 11/13/2019   BMI 48.75 kg/m  General:   alert and no distress  Skin:   no  rash or abnormalities  Lungs:   clear to auscultation bilaterally  Heart:   regular rate and rhythm, S1, S2 normal, no murmur, click, rub or gallop  Breasts:   normal without suspicious masses, skin or nipple changes or axillary nodes  Abdomen:  normal findings: no organomegaly, soft, non-tender and no hernia  Pelvis:  External genitalia: normal general appearance Urinary system: urethral meatus normal and bladder without fullness, nontender Vaginal: normal without tenderness, induration or masses Cervix: normal appearance Adnexa: normal bimanual exam Uterus: anteverted and non-tender, normal size   Lab  Review Urine pregnancy test Labs reviewed yes Radiologic studies reviewed no  50% of 20 min visit spent on counseling and coordination of care.   Assessment:     1. Encounter for routine gynecological examination with Papanicolaou smear of cervix Rx: - Cytology - PAP( [facility])  2. Vaginal discharge Rx: - Cervicovaginal ancillary only( [facility])  3. Screening for STD (sexually transmitted disease) Rx: - HIV Antibody (routine testing w rflx) - Hepatitis B surface antigen - RPR - Hepatitis C antibody  4. Class 3 severe obesity due to excess calories without serious comorbidity with body mass index (BMI) of 45.0 to 49.9 in adult [facility]) - program of caloric reduction, exercise and behavioral modification recommended    Plan:    Education reviewed: calcium supplements, depression evaluation, low fat, low cholesterol diet, safe sex/STD prevention, self breast exams and weight bearing exercise. Contraception: none. Follow up in: 1 year.    Brock Bad, MD 12/04/2019 8:32 AM

## 2019-12-04 NOTE — Progress Notes (Signed)
GYN presents for AEX/PAP/STD screening.  Reports no problems Today.  PHQ-9=0

## 2019-12-05 LAB — CERVICOVAGINAL ANCILLARY ONLY
Bacterial Vaginitis (gardnerella): POSITIVE — AB
Candida Glabrata: NEGATIVE
Candida Vaginitis: NEGATIVE
Chlamydia: NEGATIVE
Comment: NEGATIVE
Comment: NEGATIVE
Comment: NEGATIVE
Comment: NEGATIVE
Comment: NEGATIVE
Comment: NORMAL
Neisseria Gonorrhea: NEGATIVE
Trichomonas: NEGATIVE

## 2019-12-06 LAB — HEPATITIS C ANTIBODY: Hep C Virus Ab: 0.2 s/co ratio (ref 0.0–0.9)

## 2019-12-06 LAB — CYTOLOGY - PAP
Comment: NEGATIVE
Diagnosis: NEGATIVE
High risk HPV: NEGATIVE

## 2019-12-06 LAB — RPR, QUANT+TP ABS (REFLEX)
Rapid Plasma Reagin, Quant: 1:1 {titer} — ABNORMAL HIGH
T Pallidum Abs: REACTIVE — AB

## 2019-12-06 LAB — RPR: RPR Ser Ql: REACTIVE — AB

## 2019-12-06 LAB — HIV ANTIBODY (ROUTINE TESTING W REFLEX): HIV Screen 4th Generation wRfx: NONREACTIVE

## 2019-12-06 LAB — HEPATITIS B SURFACE ANTIGEN: Hepatitis B Surface Ag: NEGATIVE

## 2019-12-09 ENCOUNTER — Other Ambulatory Visit: Payer: Self-pay | Admitting: Obstetrics

## 2019-12-09 DIAGNOSIS — N760 Acute vaginitis: Secondary | ICD-10-CM

## 2019-12-09 DIAGNOSIS — B9689 Other specified bacterial agents as the cause of diseases classified elsewhere: Secondary | ICD-10-CM

## 2019-12-09 MED ORDER — METRONIDAZOLE 500 MG PO TABS: 500.0000 mg | ORAL_TABLET | Freq: Two times a day (BID) | ORAL | 2 refills | Status: DC

## 2019-12-21 ENCOUNTER — Ambulatory Visit: Payer: Medicaid Other | Attending: Internal Medicine

## 2019-12-21 DIAGNOSIS — Z23 Encounter for immunization: Secondary | ICD-10-CM

## 2019-12-21 NOTE — Progress Notes (Signed)
   Covid-19 Vaccination Clinic  Name:  Diana Jacobs    MRN: [id number] DOB: Dec 17, 1990  12/21/2019  [name] was observed post Covid-19 immunization for 15 minutes without incident. She was provided with Vaccine Information Sheet and instruction to access the V-Safe system.   [name] was instructed to call 911 with any severe reactions post vaccine: Marland Kitchen Difficulty breathing  . Swelling of face and throat  . A fast heartbeat  . A bad rash all over body  . Dizziness and weakness   Immunizations Administered    Name Date Dose VIS Date Route   Pfizer COVID-19 Vaccine 12/21/2019 11:25 AM 0.3 mL 10/23/2019 Intramuscular   Manufacturer: [employer], [employer]   Lot: [id number]   NDC: [address]-1

## 2020-06-18 IMAGING — DX DG WRIST COMPLETE 3+V*R*
4 series · 4 of 4 positions shown · non-contrast
Comparison: None.

CLINICAL DATA: Recent fall with right wrist pain.

EXAM:
RIGHT WRIST - COMPLETE 3+ VIEW

[wrist pa]
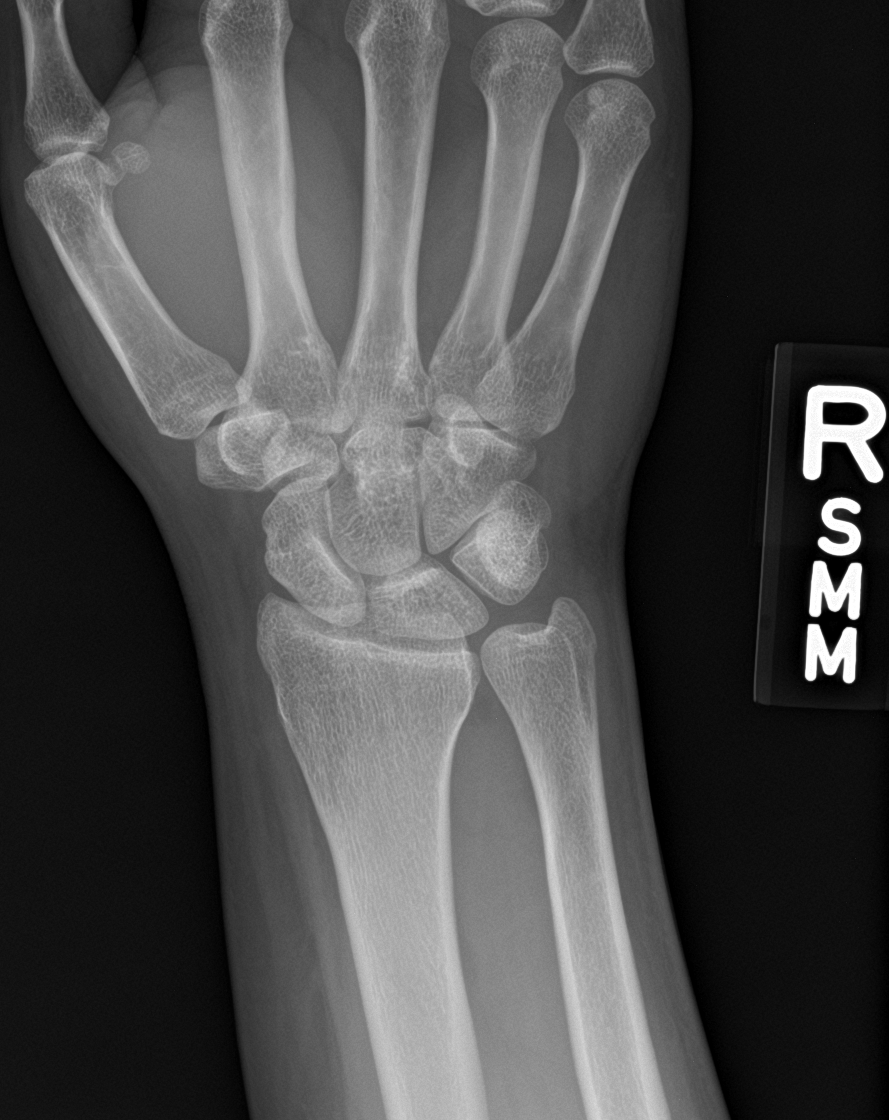

[wrist navicular]
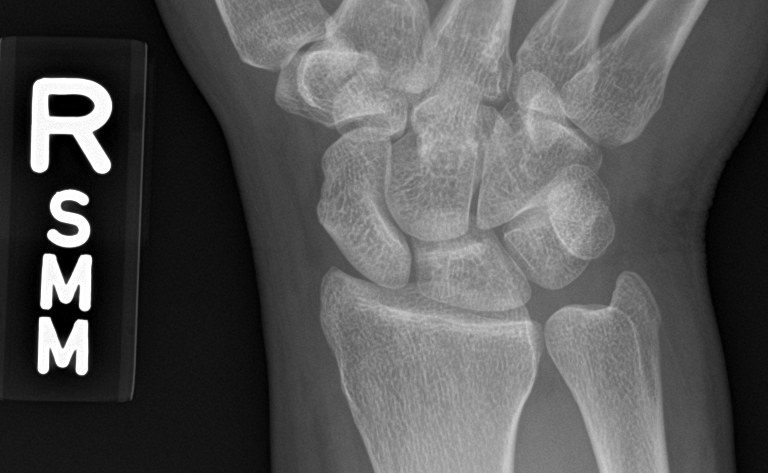

[wrist obl]
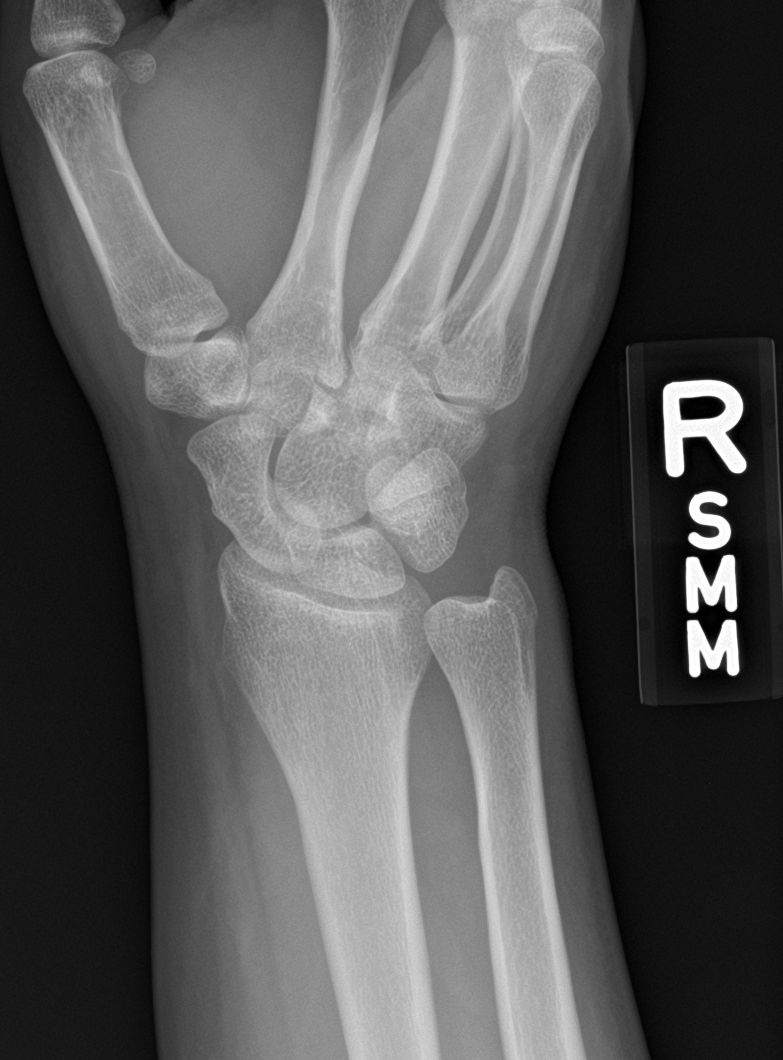

[wrist lat]
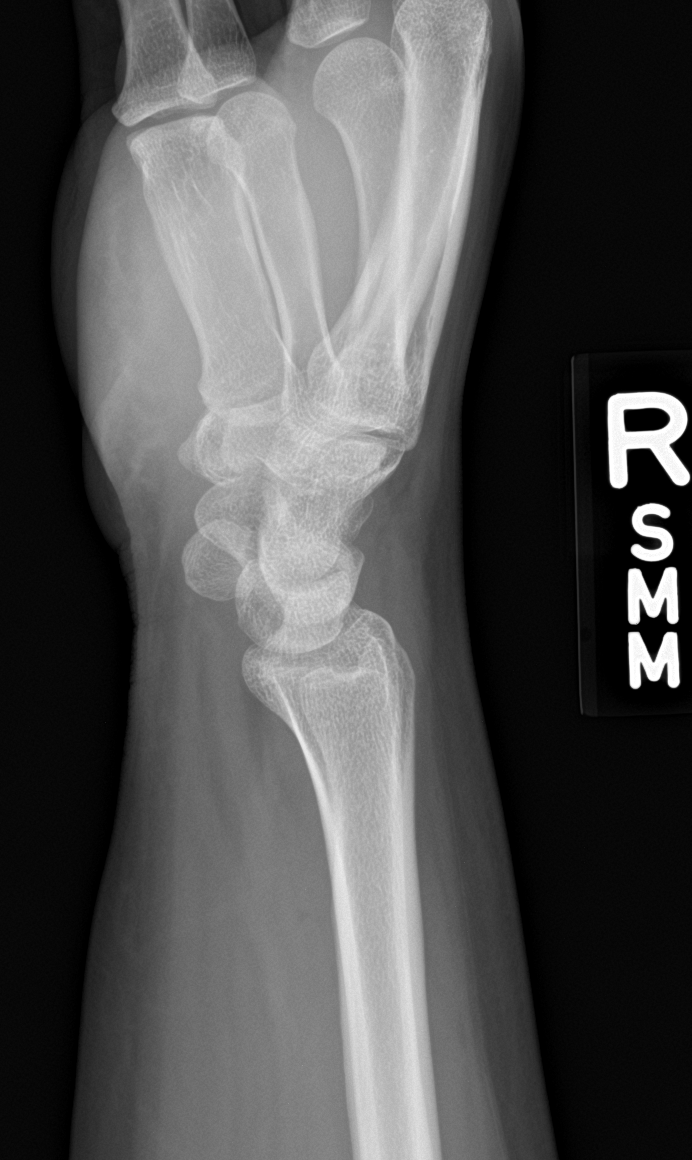

[4 of 4 positions shown; findings below may reference images not displayed]

FINDINGS: There is no evidence of fracture or dislocation. There is no
evidence of arthropathy or other focal bone abnormality. Soft
tissues are unremarkable.
IMPRESSION: No right wrist fracture or dislocation.

## 2020-11-10 ENCOUNTER — Ambulatory Visit (HOSPITAL_COMMUNITY)
Admission: EM | Admit: 2020-11-10 | Discharge: 2020-11-10 | Disposition: A | Discharge status: Home / Self Care | Payer: Medicaid Other | Attending: Urgent Care | Admitting: Urgent Care

## 2020-11-10 ENCOUNTER — Other Ambulatory Visit: Payer: Self-pay

## 2020-11-10 ENCOUNTER — Encounter (HOSPITAL_COMMUNITY): Payer: Self-pay | Admitting: Emergency Medicine

## 2020-11-10 DIAGNOSIS — J069 Acute upper respiratory infection, unspecified: Secondary | ICD-10-CM

## 2020-11-10 DIAGNOSIS — Z20822 Contact with and (suspected) exposure to covid-19: Secondary | ICD-10-CM | POA: Diagnosis not present

## 2020-11-10 DIAGNOSIS — R0789 Other chest pain: Secondary | ICD-10-CM | POA: Insufficient documentation

## 2020-11-10 DIAGNOSIS — R053 Chronic cough: Secondary | ICD-10-CM | POA: Insufficient documentation

## 2020-11-10 DIAGNOSIS — R059 Cough, unspecified: Secondary | ICD-10-CM | POA: Diagnosis present

## 2020-11-10 LAB — RESPIRATORY PANEL BY PCR

## 2020-11-10 MED ORDER — PROMETHAZINE-DM 6.25-15 MG/5ML PO SYRP: 5.0000 mL | ORAL_SOLUTION | Freq: Every evening | ORAL | 0 refills | Status: DC | PRN

## 2020-11-10 MED ORDER — BENZONATATE 100 MG PO CAPS: 100.0000 mg | ORAL_CAPSULE | Freq: Three times a day (TID) | ORAL | 0 refills | Status: DC | PRN

## 2020-11-10 MED ORDER — PREDNISONE 10 MG PO TABS: 30.0000 mg | ORAL_TABLET | Freq: Every day | ORAL | 0 refills | Status: DC

## 2020-11-10 NOTE — ED Triage Notes (Signed)
Pt had dry cough for several days causing soreness.

## 2020-11-10 NOTE — ED Provider Notes (Signed)
[name]   MRN: [id number] DOB: 07/30/1990  Subjective:   Diana Jacobs is a 30 y.o. female presenting for 2-3 day history of acute onset coughing fits that elicit chest pain, shob.  No history of asthma, smoking.  No fever, body aches, runny or stuffy nose.  The cough also hurts her throat.  No nausea, vomiting, rashes.  Patient is not currently working.  Would like a COVID and flu test.  Denies taking chronic medications.    No Known Allergies  Past Medical History:  Diagnosis Date   ASCUS with positive high risk HPV cervical pap smears    04/17/2013, 10/22/2013 and 06/25/2014. Benign colposcopy in 2015.   Herpes    Reports being told she had herpes, no lesions     Past Surgical History:  Procedure Laterality Date   EYE SURGERY     EYE SURGERY     TOOTH EXTRACTION      Family History  Problem Relation Age of Onset   Diabetes Mother    Hypertension Mother    Diabetes Father     Social History   Tobacco Use   Smoking status: Never   Smokeless tobacco: Never  Vaping Use   Vaping Use: Never used  Substance Use Topics   Alcohol use: Not Currently   Drug use: No    ROS   Objective:   Vitals: BP 138/74 (BP Location: Left Arm)   Pulse (!) 117   Temp 98.8 F (37.1 C) (Oral)   Resp (!) 24   LMP 10/29/2020   SpO2 99%   Pulse recheck was 103-107bpm.   Physical Exam Constitutional:      General: She is not in acute distress.    Appearance: Normal appearance. She is well-developed. She is obese. She is not ill-appearing, toxic-appearing or diaphoretic.  HENT:     Head: Normocephalic and atraumatic.     Right Ear: External ear normal.     Left Ear: External ear normal.     Nose: Nose normal.     Mouth/Throat:     Mouth: Mucous membranes are moist.     Pharynx: No oropharyngeal exudate or posterior oropharyngeal erythema.  Eyes:     General: No scleral icterus.       Right eye: No discharge.        Left eye: No  discharge.     Extraocular Movements: Extraocular movements intact.     Conjunctiva/sclera: Conjunctivae normal.     Pupils: Pupils are equal, round, and reactive to light.  Cardiovascular:     Rate and Rhythm: Normal rate and regular rhythm.     Pulses: Normal pulses.     Heart sounds: Normal heart sounds. No murmur heard.   No friction rub. No gallop.  Pulmonary:     Effort: Pulmonary effort is normal. No respiratory distress.     Breath sounds: Normal breath sounds. No stridor. No wheezing, rhonchi or rales.  Skin:    General: Skin is warm and dry.     Findings: No rash.  Neurological:     Mental Status: She is alert and oriented to person, place, and time.  Psychiatric:        Mood and Affect: Mood normal.        Behavior: Behavior normal.        Thought Content: Thought content normal.    Assessment and Plan :   PDMP not reviewed this encounter.  1. Viral URI with cough  2. Persistent cough   3. Atypical chest pain    Recommended oral prednisone for her coughing fits, cough suppression medications. Respiratory panel pending. Will manage for viral illness such as viral URI, viral syndrome, viral rhinitis, COVID-19, influenza. Recommended supportive care. Offered scripts for symptomatic relief. Testing is pending. Counseled patient on potential for adverse effects with medications prescribed/recommended today, ER and return-to-clinic precautions discussed, patient verbalized understanding.    Wallis Bamberg, [place] 11/10/20 1713

## 2020-11-12 ENCOUNTER — Encounter (HOSPITAL_COMMUNITY): Payer: Self-pay | Admitting: Emergency Medicine

## 2020-11-12 ENCOUNTER — Other Ambulatory Visit: Payer: Self-pay

## 2020-11-12 ENCOUNTER — Ambulatory Visit (INDEPENDENT_AMBULATORY_CARE_PROVIDER_SITE_OTHER): Payer: Medicaid Other

## 2020-11-12 ENCOUNTER — Ambulatory Visit (HOSPITAL_COMMUNITY)
Admission: EM | Admit: 2020-11-12 | Discharge: 2020-11-12 | Disposition: A | Discharge status: Home / Self Care | Payer: Medicaid Other | Attending: Student | Admitting: Student

## 2020-11-12 DIAGNOSIS — R000 Tachycardia, unspecified: Secondary | ICD-10-CM | POA: Diagnosis not present

## 2020-11-12 DIAGNOSIS — J208 Acute bronchitis due to other specified organisms: Secondary | ICD-10-CM | POA: Diagnosis not present

## 2020-11-12 DIAGNOSIS — R0602 Shortness of breath: Secondary | ICD-10-CM

## 2020-11-12 MED ORDER — ALBUTEROL SULFATE HFA 108 (90 BASE) MCG/ACT IN AERS: 1.0000 | INHALATION_SPRAY | Freq: Four times a day (QID) | RESPIRATORY_TRACT | 0 refills | Status: DC | PRN

## 2020-11-12 NOTE — ED Provider Notes (Addendum)
[facility]    CSN: [id number] Arrival date & time: 11/12/20  1245      History   Chief Complaint Chief Complaint  Patient presents with   Cough   Generalized Body Aches    HPI Diana Jacobs is a 30 y.o. female presenting with continued viral syndrome x5 days.  She was last evaluated at our urgent care 2 days ago and was diagnosed with suspected RSV though respiratory panel was ultimately negative.  She was prescribed prednisone, promethazine, prednisone which she has been taking as directed with minimal improvement. Describes 5 days of hacking cough that was initially dry and is now productive of yellow sputum, some shortness of breath with coughing.  Denies fever/chills, weakness, chest pain, dizziness, nausea/vomiting/diarrhea.  Tolerating fluids and food.  HPI  Past Medical History:  Diagnosis Date   ASCUS with positive high risk HPV cervical pap smears    04/17/2013, 10/22/2013 and 06/25/2014. Benign colposcopy in 2015.   Herpes    Reports being told she had herpes, no lesions    Patient Active Problem List   Diagnosis Date Noted   Low grade squamous intraepithelial lesion (LGSIL) on cervical Pap smear 07/21/2015    Past Surgical History:  Procedure Laterality Date   EYE SURGERY     EYE SURGERY     TOOTH EXTRACTION      OB History     Gravida  3   Para  1   Term  1   Preterm      AB  2   Living  1      SAB  1   IAB  1   Ectopic      Multiple      Live Births  1            Home Medications    Prior to Admission medications   Medication Sig Start Date End Date Taking? Authorizing Provider  albuterol (VENTOLIN HFA) 108 (90 Base) MCG/ACT inhaler Inhale 1-2 puffs into the lungs every 6 (six) hours as needed for wheezing or shortness of breath. 11/12/20  Yes Rhys Martini, PA-C  benzonatate (TESSALON) 100 MG capsule Take 1-2 capsules (100-200 mg total) by mouth 3 (three) times daily as needed for cough. 11/10/20    Wallis Bamberg, PA-C  dextromethorphan-guaiFENesin [facility] DM) 30-600 MG 12hr tablet Take 1 tablet by mouth 2 (two) times daily. Patient not taking: Reported on 12/04/2019 09/21/19   Wieters, Hallie C, PA-C  metroNIDAZOLE (FLAGYL) 500 MG tablet Take 1 tablet (500 mg total) by mouth 2 (two) times daily. 12/09/19   Brock Bad, MD  predniSONE (DELTASONE) 10 MG tablet Take 3 tablets (30 mg total) by mouth daily with breakfast. 11/10/20   Wallis Bamberg, PA-C  promethazine-dextromethorphan (PROMETHAZINE-DM) 6.25-15 MG/5ML syrup Take 5 mLs by mouth at bedtime as needed for cough. 11/10/20   Wallis Bamberg, PA-C  cetirizine (ZYRTEC ALLERGY) 10 MG tablet Take 1 tablet (10 mg total) by mouth daily. 08/21/19 09/21/19  Wallis Bamberg, PA-C  etonogestrel (NEXPLANON) 68 MG IMPL implant 1 each by Subdermal route once. Inserted 05/2016  01/14/19  [provider]    Family History Family History  Problem Relation Age of Onset   Diabetes Mother    Hypertension Mother    Diabetes Father     Social History Social History   Tobacco Use   Smoking status: Never   Smokeless tobacco: Never  Vaping Use   Vaping Use: Never used  Substance  Use Topics   Alcohol use: Not Currently   Drug use: No     Allergies   Patient has no known allergies.   Review of Systems Review of Systems  Constitutional:  Negative for appetite change, chills and fever.  HENT:  Positive for congestion. Negative for ear pain, rhinorrhea, sinus pressure, sinus pain and sore throat.   Eyes:  Negative for redness and visual disturbance.  Respiratory:  Positive for cough. Negative for chest tightness, shortness of breath and wheezing.   Cardiovascular:  Negative for chest pain and palpitations.  Gastrointestinal:  Negative for abdominal pain, constipation, diarrhea, nausea and vomiting.  Genitourinary:  Negative for dysuria, frequency and urgency.  Musculoskeletal:  Negative for myalgias.  Neurological:  Negative for dizziness,  weakness and headaches.  Psychiatric/Behavioral:  Negative for confusion.   All other systems reviewed and are negative.   Physical Exam Triage Vital Signs ED Triage Vitals  Enc Vitals Group     BP      Pulse      Resp      Temp      Temp src      SpO2      Weight      Height      Head Circumference      Peak Flow      Pain Score      Pain Loc      Pain Edu?      Excl. in GC?    No data found.  Updated Vital Signs BP 137/83   Pulse (!) 112   Temp 99.1 F (37.3 C)   Resp 19   LMP 10/29/2020   SpO2 94%   Visual Acuity Right Eye Distance:   Left Eye Distance:   Bilateral Distance:    Right Eye Near:   Left Eye Near:    Bilateral Near:     Physical Exam Vitals reviewed.  Constitutional:      General: She is not in acute distress.    Appearance: Normal appearance. She is not ill-appearing.  HENT:     Head: Normocephalic and atraumatic.     Right Ear: Tympanic membrane, ear canal and external ear normal. No tenderness. No middle ear effusion. There is no impacted cerumen. Tympanic membrane is not perforated, erythematous, retracted or bulging.     Left Ear: Tympanic membrane, ear canal and external ear normal. No tenderness.  No middle ear effusion. There is no impacted cerumen. Tympanic membrane is not perforated, erythematous, retracted or bulging.     Nose: Nose normal. No congestion.     Mouth/Throat:     Mouth: Mucous membranes are moist.     Pharynx: Uvula midline. No oropharyngeal exudate or posterior oropharyngeal erythema.  Eyes:     Extraocular Movements: Extraocular movements intact.     Pupils: Pupils are equal, round, and reactive to light.  Cardiovascular:     Rate and Rhythm: Regular rhythm. Tachycardia present.     Heart sounds: Normal heart sounds.  Pulmonary:     Effort: Pulmonary effort is normal.     Breath sounds: Normal breath sounds. No decreased breath sounds, wheezing, rhonchi or rales.     Comments: Frequent cough Abdominal:      Palpations: Abdomen is soft.     Tenderness: There is no abdominal tenderness. There is no guarding or rebound.  Musculoskeletal:     Right lower leg: No edema.     Left lower leg: No edema.  Lymphadenopathy:  Cervical: No cervical adenopathy.     Right cervical: No superficial cervical adenopathy.    Left cervical: No superficial cervical adenopathy.  Neurological:     General: No focal deficit present.     Mental Status: She is alert and oriented to person, place, and time.  Psychiatric:        Mood and Affect: Mood normal.        Behavior: Behavior normal.        Thought Content: Thought content normal.        Judgment: Judgment normal.     UC Treatments / Results  Labs (all labs ordered are listed, but only abnormal results are displayed) Labs Reviewed - No data to display  EKG   Radiology DG Chest 2 View  Result Date: 11/12/2020 CLINICAL DATA:  Shortness of breath, tachycardia EXAM: CHEST - 2 VIEW COMPARISON:  None FINDINGS: Normal heart size, mediastinal contours, and pulmonary vascularity. Lungs clear. No pulmonary infiltrate, pleural effusion, or pneumothorax. Osseous structures unremarkable. IMPRESSION: No acute abnormalities. Electronically Signed   By: Ulyses SouthwardMark  Boles M.D.   On: 11/12/2020 14:05    Procedures Procedures (including critical care time)  Medications Ordered in UC Medications - No data to display  Initial Impression / Assessment and Plan / UC Course  I have reviewed the triage vital signs and the nursing notes.  Pertinent labs & imaging results that were available during my care of the patient were reviewed by me and considered in my medical decision making (see chart for details).     This patient is a very pleasant 30 y.o. year old female presenting with viral bronchitis. Today this pt is tachycardic but afebrile nontachypneic, oxygenating well on room air, no wheezes rhonchi or rales.   States she is not pregnant or breastfeeding. LMP 10/27,  she has not had unprotected intercourse since then.  Denies history pulm ds, denies history smoking. She was last evaluated at our urgent care 2 days ago (11/8) and was diagnosed with suspected RSV though respiratory panel was ultimately negative.  She was prescribed prednisone, promethazine, prednisone.  Given progressively worsening cough with tachycardia despite treatment, I did check a CXR: this was normal.  Complete prednisone, promethazine, tessalon as directed. Also sent albuterol inhaler today.  ED return precautions discussed. Patient verbalizes understanding and agreement.   Coding Level 4 for acute illness with systemic symptoms, and prescription drug management    Final Clinical Impressions(s) / UC Diagnoses   Final diagnoses:  Viral bronchitis     Discharge Instructions      -Your chest xray looks good.  -You have bronchitis. Bronchitis is an inflammation of the lining of your bronchial tubes, which carry air to and from your lungs. This typically occurs after a virus, like a cold virus. People who have bronchitis often cough up thickened mucus, which can be discolored. This isn't a bacterial infection, so you don't need antibiotics. We treat it with medications to help reduce inflammation and open up your lungs.  -Albuterol inhaler as needed for cough, wheezing, shortness of breath, 1 to 2 puffs every 6 hours as needed. -Continue prednisone, promethazine, Tessalon.     ED Prescriptions     Medication Sig Dispense Auth. Provider   albuterol (VENTOLIN HFA) 108 (90 Base) MCG/ACT inhaler Inhale 1-2 puffs into the lungs every 6 (six) hours as needed for wheezing or shortness of breath. 1 each Rhys MartiniGraham, Naylah Cork E, PA-C      PDMP not reviewed this encounter.  Hazel Sams, PA-C 11/12/20 1422    Hazel Sams, PA-C 11/12/20 1428

## 2020-11-12 NOTE — ED Triage Notes (Signed)
Pt is present today with body aches and cough. Pt states sx x4 days ago.

## 2020-11-12 NOTE — Discharge Instructions (Addendum)
-  Your chest xray looks good.  -You have bronchitis. Bronchitis is an inflammation of the lining of your bronchial tubes, which carry air to and from your lungs. This typically occurs after a virus, like a cold virus. People who have bronchitis often cough up thickened mucus, which can be discolored. This isn't a bacterial infection, so you don't need antibiotics. We treat it with medications to help reduce inflammation and open up your lungs.  -Albuterol inhaler as needed for cough, wheezing, shortness of breath, 1 to 2 puffs every 6 hours as needed. -Continue prednisone, promethazine, Tessalon.

## 2021-01-11 DIAGNOSIS — M25569 Pain in unspecified knee: Secondary | ICD-10-CM | POA: Diagnosis not present

## 2021-01-11 DIAGNOSIS — Z131 Encounter for screening for diabetes mellitus: Secondary | ICD-10-CM | POA: Diagnosis not present

## 2021-01-11 DIAGNOSIS — Z0000 Encounter for general adult medical examination without abnormal findings: Secondary | ICD-10-CM | POA: Diagnosis not present

## 2021-01-11 DIAGNOSIS — Z13220 Encounter for screening for lipoid disorders: Secondary | ICD-10-CM | POA: Diagnosis not present

## 2021-01-13 ENCOUNTER — Other Ambulatory Visit: Payer: Self-pay | Admitting: Internal Medicine

## 2021-01-13 DIAGNOSIS — Z13220 Encounter for screening for lipoid disorders: Secondary | ICD-10-CM | POA: Diagnosis not present

## 2021-01-13 DIAGNOSIS — M25569 Pain in unspecified knee: Secondary | ICD-10-CM | POA: Diagnosis not present

## 2021-01-13 DIAGNOSIS — Z131 Encounter for screening for diabetes mellitus: Secondary | ICD-10-CM | POA: Diagnosis not present

## 2021-01-13 DIAGNOSIS — M25562 Pain in left knee: Secondary | ICD-10-CM | POA: Diagnosis not present

## 2021-01-13 DIAGNOSIS — M25561 Pain in right knee: Secondary | ICD-10-CM | POA: Diagnosis not present

## 2021-01-13 DIAGNOSIS — Z0000 Encounter for general adult medical examination without abnormal findings: Secondary | ICD-10-CM | POA: Diagnosis not present

## 2021-01-13 DIAGNOSIS — H6123 Impacted cerumen, bilateral: Secondary | ICD-10-CM | POA: Diagnosis not present

## 2021-01-13 DIAGNOSIS — Z6841 Body Mass Index (BMI) 40.0 and over, adult: Secondary | ICD-10-CM | POA: Diagnosis not present

## 2021-01-13 DIAGNOSIS — E559 Vitamin D deficiency, unspecified: Secondary | ICD-10-CM | POA: Diagnosis not present

## 2021-01-14 LAB — COMPLETE METABOLIC PANEL WITH GFR
AG Ratio: 1.3 (calc) (ref 1.0–2.5)
ALT: 18 U/L (ref 6–29)
AST: 21 U/L (ref 10–30)
Albumin: 4.5 g/dL (ref 3.6–5.1)
Alkaline phosphatase (APISO): 72 U/L (ref 31–125)
BUN/Creatinine Ratio: 4 (calc) — ABNORMAL LOW (ref 6–22)
BUN: 4 mg/dL — ABNORMAL LOW (ref 7–25)
CO2: 27 mmol/L (ref 20–32)
Calcium: 9.5 mg/dL (ref 8.6–10.2)
Chloride: 101 mmol/L (ref 98–110)
Creat: 0.91 mg/dL (ref 0.50–0.97)
Globulin: 3.6 g/dL (calc) (ref 1.9–3.7)
Glucose, Bld: 91 mg/dL (ref 65–99)
Potassium: 4.7 mmol/L (ref 3.5–5.3)
Sodium: 138 mmol/L (ref 135–146)
Total Bilirubin: 0.4 mg/dL (ref 0.2–1.2)
Total Protein: 8.1 g/dL (ref 6.1–8.1)
eGFR: 86 mL/min/{1.73_m2} (ref >=60)

## 2021-01-14 LAB — LIPID PANEL
Cholesterol: 150 mg/dL (ref <200)
HDL: 32 mg/dL — ABNORMAL LOW (ref >=50)
LDL Cholesterol (Calc): 91 mg/dL (calc)
Non-HDL Cholesterol (Calc): 118 mg/dL (calc) (ref <130)
Total CHOL/HDL Ratio: 4.7 (calc) (ref <5.0)
Triglycerides: 172 mg/dL — ABNORMAL HIGH (ref <150)

## 2021-01-14 LAB — TSH: TSH: 0.77 mIU/L

## 2021-01-14 LAB — VITAMIN D 25 HYDROXY (VIT D DEFICIENCY, FRACTURES): Vit D, 25-Hydroxy: 15 ng/mL — ABNORMAL LOW (ref 30–100)

## 2021-01-14 LAB — EXTRA LAV TOP TUBE

## 2021-01-27 DIAGNOSIS — H6123 Impacted cerumen, bilateral: Secondary | ICD-10-CM | POA: Diagnosis not present

## 2021-01-27 DIAGNOSIS — M25569 Pain in unspecified knee: Secondary | ICD-10-CM | POA: Diagnosis not present

## 2021-02-26 DIAGNOSIS — M25569 Pain in unspecified knee: Secondary | ICD-10-CM | POA: Diagnosis not present

## 2021-02-26 DIAGNOSIS — Z6841 Body Mass Index (BMI) 40.0 and over, adult: Secondary | ICD-10-CM | POA: Diagnosis not present

## 2021-04-02 DIAGNOSIS — Z6841 Body Mass Index (BMI) 40.0 and over, adult: Secondary | ICD-10-CM | POA: Diagnosis not present

## 2021-04-02 DIAGNOSIS — M25562 Pain in left knee: Secondary | ICD-10-CM | POA: Diagnosis not present

## 2021-05-25 ENCOUNTER — Ambulatory Visit (INDEPENDENT_AMBULATORY_CARE_PROVIDER_SITE_OTHER): Payer: Medicaid Other | Admitting: Obstetrics

## 2021-05-25 ENCOUNTER — Other Ambulatory Visit (HOSPITAL_COMMUNITY)
Admission: RE | Admit: 2021-05-25 | Discharge: 2021-05-25 | Disposition: A | Discharge status: Home / Self Care | Payer: Medicaid Other | Source: Ambulatory Visit | Attending: Obstetrics | Admitting: Obstetrics

## 2021-05-25 ENCOUNTER — Encounter: Payer: Self-pay | Admitting: Obstetrics

## 2021-05-25 VITALS — BP 125/83 | HR 82 | Ht 64.5 in | Wt 285.0 lb

## 2021-05-25 DIAGNOSIS — Z113 Encounter for screening for infections with a predominantly sexual mode of transmission: Secondary | ICD-10-CM

## 2021-05-25 DIAGNOSIS — Z6841 Body Mass Index (BMI) 40.0 and over, adult: Secondary | ICD-10-CM | POA: Diagnosis not present

## 2021-05-25 DIAGNOSIS — N898 Other specified noninflammatory disorders of vagina: Secondary | ICD-10-CM

## 2021-05-25 DIAGNOSIS — Z131 Encounter for screening for diabetes mellitus: Secondary | ICD-10-CM | POA: Diagnosis not present

## 2021-05-25 DIAGNOSIS — Z01419 Encounter for gynecological examination (general) (routine) without abnormal findings: Secondary | ICD-10-CM | POA: Diagnosis not present

## 2021-05-25 NOTE — Progress Notes (Addendum)
Subjective:        Diana Jacobs is a 31 y.o. female here for a routine exam.  Current complaints: Vaginal discharge.    Personal health questionnaire:  Is patient Ashkenazi Jewish, have a family history of breast and/or ovarian cancer: no Is there a family history of uterine cancer diagnosed at age < 67, gastrointestinal cancer, urinary tract cancer, family member who is a Field seismologist syndrome-associated carrier: no Is the patient overweight and hypertensive, family history of diabetes, personal history of gestational diabetes, preeclampsia or PCOS: no Is patient over 78, have PCOS,  family history of premature CHD under age 1, diabetes, smoke, have hypertension or peripheral artery disease:  no At any time, has a partner hit, kicked or otherwise hurt or frightened you?: no Over the past 2 weeks, have you felt down, depressed or hopeless?: no Over the past 2 weeks, have you felt little interest or pleasure in doing things?:no   Gynecologic History Patient's last menstrual period was 04/26/2021 (approximate). Contraception: tubal ligation Last Pap: 2021. Results were: normal Last mammogram: n/a. Results were: n/a  Obstetric History OB History  Gravida Para Term Preterm AB Living  3 1 1   2 1   SAB IAB Ectopic Multiple Live Births  1 1     1     # Outcome Date GA Lbr Len/2nd Weight Sex Delivery Anes PTL Lv  3 Term 02/28/13 [redacted]w[redacted]d / 01:22 8 lb 4.8 oz (3.765 kg) M Vag-Spont EPI  LIV  2 IAB 2015          1 SAB 02/02/11             Birth Comments: 7wks    Past Medical History:  Diagnosis Date   ASCUS with positive high risk HPV cervical pap smears    04/17/2013, 10/22/2013 and 06/25/2014. Benign colposcopy in 2015.   Herpes    Reports being told she had herpes, no lesions    Past Surgical History:  Procedure Laterality Date   EYE SURGERY     EYE SURGERY     TOOTH EXTRACTION       Current Outpatient Medications:    albuterol (VENTOLIN HFA) 108 (90 Base) MCG/ACT  inhaler, Inhale 1-2 puffs into the lungs every 6 (six) hours as needed for wheezing or shortness of breath., Disp: 1 each, Rfl: 0 No Known Allergies  Social History   Tobacco Use   Smoking status: Never   Smokeless tobacco: Never  Substance Use Topics   Alcohol use: Not Currently    Family History  Problem Relation Age of Onset   Diabetes Mother    Hypertension Mother    Diabetes Father       Review of Systems  Constitutional: negative for fatigue and weight loss Respiratory: negative for cough and wheezing Cardiovascular: negative for chest pain, fatigue and palpitations Gastrointestinal: negative for abdominal pain and change in bowel habits Musculoskeletal:negative for myalgias Neurological: negative for gait problems and tremors Behavioral/Psych: negative for abusive relationship, depression Endocrine: negative for temperature intolerance    Genitourinary: positive for vaginal discharge.  negative for abnormal menstrual periods, genital lesions, hot flashes, sexual problems  Integument/breast: negative for breast lump, breast tenderness, nipple discharge and skin lesion(s)    Objective:       BP 125/83   Pulse 82   Ht 5' 4.5" (1.638 m)   Wt 285 lb (129.3 kg)   LMP 04/26/2021 (Approximate)   BMI 48.16 kg/m  General:   Alert and no distress  Skin:   no rash or abnormalities  Lungs:   clear to auscultation bilaterally  Heart:   regular rate and rhythm, S1, S2 normal, no murmur, click, rub or gallop  Breasts:   normal without suspicious masses, skin or nipple changes or axillary nodes  Abdomen:  normal findings: no organomegaly, soft, non-tender and no hernia  Pelvis:  External genitalia: normal general appearance Urinary system: urethral meatus normal and bladder without fullness, nontender Vaginal: normal without tenderness, induration or masses Cervix: normal appearance Adnexa: normal bimanual exam Uterus: anteverted and non-tender, normal size   Lab  Review Urine pregnancy test Labs reviewed yes Radiologic studies reviewed no  I have spent a total of 20 minutes of face-to-face and non-face-to-face time, excluding clinical staff time, reviewing notes and preparing to see patient, ordering tests and/or medications, and counseling the patient.   Assessment:    1. Encounter for routine gynecological examination with Papanicolaou smear of cervix Rx: - Cytology - PAP( [place])  2. Vaginal discharge  Rx: - Cervicovaginal ancillary only( [place])  3. Screening for STD (sexually transmitted disease) Rx: - HIV Antibody (routine testing w rflx) - Hepatitis B surface antigen - RPR - Hepatitis C antibody  4. Class 3 severe obesity due to excess calories without serious comorbidity with body mass index (BMI) of 45.0 to 49.9 in adult (HCC) - weight reduction with the aid of dietary changes, exercise and behavioral modification recommended  5. Screening for diabetes mellitus Rx: - Hemoglobin A1c     Plan:    Education reviewed: calcium supplements, depression evaluation, low fat, low cholesterol diet, safe sex/STD prevention, self breast exams, and weight bearing exercise. Follow up in: 1 year.    Orders Placed This Encounter  Procedures   HIV Antibody (routine testing w rflx)   Hepatitis B surface antigen   RPR   Hepatitis C antibody   Hemoglobin A1c     Shelly Bombard, MD 05/25/2021 8:57 AM

## 2021-05-26 LAB — CERVICOVAGINAL ANCILLARY ONLY
Bacterial Vaginitis (gardnerella): POSITIVE — AB
Candida Glabrata: NEGATIVE
Candida Vaginitis: NEGATIVE
Chlamydia: NEGATIVE
Comment: NEGATIVE
Comment: NEGATIVE
Comment: NEGATIVE
Comment: NEGATIVE
Comment: NEGATIVE
Comment: NORMAL
Neisseria Gonorrhea: NEGATIVE
Trichomonas: NEGATIVE

## 2021-05-26 LAB — RPR: RPR Ser Ql: NONREACTIVE

## 2021-05-26 LAB — HEPATITIS C ANTIBODY: Hep C Virus Ab: NONREACTIVE

## 2021-05-26 LAB — HIV ANTIBODY (ROUTINE TESTING W REFLEX): HIV Screen 4th Generation wRfx: NONREACTIVE

## 2021-05-26 LAB — HEMOGLOBIN A1C
Est. average glucose Bld gHb Est-mCnc: 114 mg/dL
Hgb A1c MFr Bld: 5.6 % (ref 4.8–5.6)

## 2021-05-26 LAB — HEPATITIS B SURFACE ANTIGEN: Hepatitis B Surface Ag: NEGATIVE

## 2021-05-28 ENCOUNTER — Encounter: Payer: Self-pay | Admitting: Emergency Medicine

## 2021-05-28 ENCOUNTER — Other Ambulatory Visit: Payer: Self-pay | Admitting: Obstetrics

## 2021-05-28 DIAGNOSIS — B9689 Other specified bacterial agents as the cause of diseases classified elsewhere: Secondary | ICD-10-CM

## 2021-05-28 LAB — CYTOLOGY - PAP
Comment: NEGATIVE
Diagnosis: NEGATIVE
Diagnosis: REACTIVE
High risk HPV: NEGATIVE

## 2021-05-28 MED ORDER — METRONIDAZOLE 500 MG PO TABS: 500.0000 mg | ORAL_TABLET | Freq: Two times a day (BID) | ORAL | 2 refills | Status: DC

## 2021-06-28 ENCOUNTER — Emergency Department (HOSPITAL_COMMUNITY)
Admission: EM | Admit: 2021-06-28 | Discharge: 2021-06-28 | Disposition: A | Discharge status: Home / Self Care | Payer: Medicaid Other | Attending: Emergency Medicine | Admitting: Emergency Medicine

## 2021-06-28 ENCOUNTER — Encounter (HOSPITAL_COMMUNITY): Payer: Self-pay | Admitting: Emergency Medicine

## 2021-06-28 DIAGNOSIS — Z20822 Contact with and (suspected) exposure to covid-19: Secondary | ICD-10-CM | POA: Insufficient documentation

## 2021-06-28 LAB — RESP PANEL BY RT-PCR (FLU A&B, COVID) ARPGX2
Influenza A by PCR: NEGATIVE
Influenza B by PCR: NEGATIVE
SARS Coronavirus 2 by RT PCR: NEGATIVE

## 2021-06-28 NOTE — ED Provider Notes (Signed)
Complaint [facility]-EMERGENCY DEPT Provider Note   CSN: [id number] Arrival date & time: 06/28/21  1249     History  Chief Complaint  Patient presents with   Covid Exposure    Diana Jacobs is a 31 y.o. female.  HPI  31 year old female presents emergency department of COVID exposure.  Patient states that a family member began to have symptoms 2 days ago.  That family member tested positive for COVID yesterday.  Patient is currently endorsing no symptoms at this time but wants to test so that she can quarantine if necessary.  Denies fever, chills, night sweats, chest pain, shortness of breath, runny nose, sore throat, congestion, cough, abdominal pain, nausea/vomiting/diarrhea, urinary symptoms, change in bowel habits.  Home Medications Prior to Admission medications   Medication Sig Start Date End Date Taking? Authorizing Provider  albuterol (VENTOLIN HFA) 108 (90 Base) MCG/ACT inhaler Inhale 1-2 puffs into the lungs every 6 (six) hours as needed for wheezing or shortness of breath. 11/12/20   Rhys Martini, PA-C  metroNIDAZOLE (FLAGYL) 500 MG tablet Take 1 tablet (500 mg total) by mouth 2 (two) times daily. 05/28/21   Brock Bad, MD  cetirizine (ZYRTEC ALLERGY) 10 MG tablet Take 1 tablet (10 mg total) by mouth daily. 08/21/19 09/21/19  Wallis Bamberg, PA-C  etonogestrel (NEXPLANON) 68 MG IMPL implant 1 each by Subdermal route once. Inserted 05/2016  01/14/19  [provider]      Allergies    Patient has no known allergies.    Review of Systems   Review of Systems  Constitutional:  Negative for chills and fever.  HENT:  Negative for ear pain and sore throat.   Eyes:  Negative for pain and visual disturbance.  Respiratory:  Negative for cough and shortness of breath.   Cardiovascular:  Negative for chest pain and palpitations.  Gastrointestinal:  Negative for abdominal pain and vomiting.  Genitourinary:  Negative for dysuria and  hematuria.  Musculoskeletal:  Negative for arthralgias and back pain.  Skin:  Negative for color change and rash.  Neurological:  Negative for seizures and syncope.  All other systems reviewed and are negative.   Physical Exam Updated Vital Signs BP (!) 140/95 (BP Location: Right Arm)   Pulse 85   Temp 97.9 F (36.6 C) (Oral)   Resp 16   LMP 06/04/2021   SpO2 100%  Physical Exam Vitals and nursing note reviewed.  Constitutional:      General: She is not in acute distress.    Appearance: She is well-developed.  HENT:     Head: Normocephalic and atraumatic.     Right Ear: Tympanic membrane normal.     Left Ear: Tympanic membrane normal.     Nose: Nose normal. No congestion.     Mouth/Throat:     Mouth: Mucous membranes are moist.     Pharynx: Oropharynx is clear. No oropharyngeal exudate or posterior oropharyngeal erythema.  Eyes:     General:        Right eye: No discharge.        Left eye: No discharge.     Extraocular Movements: Extraocular movements intact.     Conjunctiva/sclera: Conjunctivae normal.  Cardiovascular:     Rate and Rhythm: Normal rate and regular rhythm.     Heart sounds: No murmur heard. Pulmonary:     Effort: Pulmonary effort is normal. No respiratory distress.     Breath sounds: Normal breath sounds. No wheezing or rales.  Chest:     Chest wall: No tenderness.  Abdominal:     Palpations: Abdomen is soft.     Tenderness: There is no abdominal tenderness.  Musculoskeletal:        General: No swelling.     Cervical back: Neck supple.  Skin:    General: Skin is warm and dry.     Capillary Refill: Capillary refill takes less than 2 seconds.  Neurological:     Mental Status: She is alert.  Psychiatric:        Mood and Affect: Mood normal.     ED Results / Procedures / Treatments   Labs (all labs ordered are listed, but only abnormal results are displayed) Labs Reviewed  RESP PANEL BY RT-PCR (FLU A&B, COVID) ARPGX2     EKG None  Radiology No results found.  Procedures Procedures    Medications Ordered in ED Medications - No data to display  ED Course/ Medical Decision Making/ A&P                           Medical Decision Making  This patient presents to the ED for concern of COVID exposure, this involves an extensive number of treatment options, and is a complaint that carries with it a high risk of complications and morbidity.  The differential diagnosis includes coronavirus, influenza, other URI, pulmonary embolism, DVT   Co morbidities that complicate the patient evaluation  Herpes   Additional history obtained:  Additional history obtained from family members who are at bedside External records from outside source obtained and reviewed including positive coronavirus test from family member   Lab Tests:  I Ordered, and personally interpreted labs.  The pertinent results include: Respiratory viral panel pending upon discharge   Imaging Studies ordered:  N/a   Cardiac Monitoring: / EKG:  The patient was maintained on a cardiac monitor.  I personally viewed and interpreted the cardiac monitored which showed an underlying rhythm of: Sinus rhythm   Consultations Obtained:  N/a   Problem List / ED Course / Critical interventions / Medication management  COVID exposure Reevaluation of the patient showed that the patient improved I have reviewed the patients home medicines and have made adjustments as needed   Social Determinants of Health:  Denies tobacco, illicit drug use   Test / Admission - Considered:  COVID exposure Vitals signs significant for hypertension with a blood pressure 140/95.  Close follow-up with PCP recommended.. Otherwise within normal range and stable throughout visit. Laboratory studies include respiratory viral panel.  Pending upon discharge.  Patient to follow MyChart as indicated for testing results.  Recommended quarantine guidelines by  the [facility] given to patient upon discharge if results are positive. Worrisome signs and symptoms were discussed with the patient, and the patient acknowledged understanding to return to the ED if noticed. Patient was stable upon discharge.          Final Clinical Impression(s) / ED Diagnoses Final diagnoses:  Close exposure to COVID-19 virus    Rx / DC Orders ED Discharge Orders     None         Peter Garter, [place] 06/28/21 1339    Terrilee Files, MD 06/28/21 [phone]

## 2021-07-03 ENCOUNTER — Emergency Department (HOSPITAL_COMMUNITY)
Admission: EM | Admit: 2021-07-03 | Discharge: 2021-07-03 | Disposition: A | Discharge status: Home / Self Care | Payer: Medicaid Other | Attending: Emergency Medicine | Admitting: Emergency Medicine

## 2021-07-03 ENCOUNTER — Encounter (HOSPITAL_COMMUNITY): Payer: Self-pay | Admitting: Emergency Medicine

## 2021-07-03 DIAGNOSIS — Z20822 Contact with and (suspected) exposure to covid-19: Secondary | ICD-10-CM | POA: Insufficient documentation

## 2021-07-03 DIAGNOSIS — Z7189 Other specified counseling: Secondary | ICD-10-CM

## 2021-07-03 NOTE — ED Provider Notes (Signed)
[facility]-EMERGENCY DEPT Provider Note   CSN: [id number] Arrival date & time: 07/03/21  5427     History  Chief Complaint  Patient presents with   Covid Exposure    Diana Jacobs is a 31 y.o. female.  HPI Patient is a 31 year old female presented emergency room today because of concern for exposure to COVID.  She has a son who has COVID-19.  He tested positive on 6/26.  Patient never developed any symptoms at all.  Feels well now.  She is here in the emergency room requesting a COVID test to allow her to return to work.    Home Medications Prior to Admission medications   Medication Sig Start Date End Date Taking? Authorizing Provider  albuterol (VENTOLIN HFA) 108 (90 Base) MCG/ACT inhaler Inhale 1-2 puffs into the lungs every 6 (six) hours as needed for wheezing or shortness of breath. 11/12/20   Rhys Martini, PA-C  metroNIDAZOLE (FLAGYL) 500 MG tablet Take 1 tablet (500 mg total) by mouth 2 (two) times daily. 05/28/21   Brock Bad, MD  cetirizine (ZYRTEC ALLERGY) 10 MG tablet Take 1 tablet (10 mg total) by mouth daily. 08/21/19 09/21/19  Wallis Bamberg, PA-C  etonogestrel (NEXPLANON) 68 MG IMPL implant 1 each by Subdermal route once. Inserted 05/2016  01/14/19  [provider]      Allergies    Patient has no known allergies.    Review of Systems   Review of Systems  Physical Exam Updated Vital Signs BP 140/66 (BP Location: Right Arm)   Pulse 77   Temp 98.1 F (36.7 C) (Oral)   Resp 18   LMP 06/04/2021   SpO2 94%  Physical Exam Vitals and nursing note reviewed.  Constitutional:      General: She is not in acute distress.    Appearance: Normal appearance. She is not ill-appearing.  HENT:     Head: Normocephalic and atraumatic.  Eyes:     General: No scleral icterus.       Right eye: No discharge.        Left eye: No discharge.     Conjunctiva/sclera: Conjunctivae normal.  Pulmonary:     Effort: Pulmonary effort is  normal.     Breath sounds: No stridor.  Neurological:     Mental Status: She is alert and oriented to person, place, and time. Mental status is at baseline.     ED Results / Procedures / Treatments   Labs (all labs ordered are listed, but only abnormal results are displayed) Labs Reviewed  RESP PANEL BY RT-PCR (FLU A&B, COVID) ARPGX2    EKG None  Radiology No results found.  Procedures Procedures    Medications Ordered in ED Medications - No data to display  ED Course/ Medical Decision Making/ A&P                           Medical Decision Making   Patient is a 31 year old female presented emergency room today because of concern for exposure to COVID.  She has a son who has COVID-19.  He tested positive on 6/26.  Patient never developed any symptoms at all.  Feels well now.  She is here in the emergency room requesting a COVID test to allow her to return to work.  Patient denies any symptoms.  Well-appearing with normal vital signs afebrile  She has already follow CDC guidelines for 5-day quarantine.  Will discharge home  with work note.  Return precautions discussed   Final Clinical Impression(s) / ED Diagnoses Final diagnoses:  Educated about COVID-19 virus infection    Rx / DC Orders ED Discharge Orders     None         Gailen Shelter, [place] 07/03/21 1212    Derwood Kaplan, MD 07/04/21 [phone]

## 2021-07-03 NOTE — ED Triage Notes (Signed)
Patient reports son was Covid positive last week. She was tested at that time. She is requesting a retest so that she can return to work. Denies symptoms.

## 2021-09-17 ENCOUNTER — Encounter (HOSPITAL_COMMUNITY): Payer: Self-pay | Admitting: *Deleted

## 2021-09-17 ENCOUNTER — Ambulatory Visit (HOSPITAL_COMMUNITY)
Admission: EM | Admit: 2021-09-17 | Discharge: 2021-09-17 | Disposition: A | Discharge status: Home / Self Care | Payer: Medicaid Other | Attending: Physician Assistant | Admitting: Physician Assistant

## 2021-09-17 DIAGNOSIS — S39012A Strain of muscle, fascia and tendon of lower back, initial encounter: Secondary | ICD-10-CM

## 2021-09-17 DIAGNOSIS — M62830 Muscle spasm of back: Secondary | ICD-10-CM | POA: Diagnosis not present

## 2021-09-17 DIAGNOSIS — M545 Low back pain, unspecified: Secondary | ICD-10-CM

## 2021-09-17 MED ORDER — TIZANIDINE HCL 4 MG PO TABS: 4.0000 mg | ORAL_TABLET | Freq: Four times a day (QID) | ORAL | 0 refills | Status: DC | PRN

## 2021-09-17 MED ORDER — HYDROCODONE-ACETAMINOPHEN 5-325 MG PO TABS: 1.0000 | ORAL_TABLET | Freq: Four times a day (QID) | ORAL | 0 refills | Status: DC | PRN

## 2021-09-17 MED ORDER — IBUPROFEN 800 MG PO TABS: 800.0000 mg | ORAL_TABLET | Freq: Three times a day (TID) | ORAL | 0 refills | Status: DC

## 2021-09-17 NOTE — Discharge Instructions (Addendum)
Advised to take Vicodin tablets 1-2 every 6 hours as needed for acute pain relief over the next 24 hours. Advised take the Zanaflex every 6 hours to help relieve muscle spasm and irritability. Advised take ibuprofen 800 mg every 8 hours with food to help reduce pain and acute inflammation. Advised ice therapy, 10 minutes on 20 minutes off, 4-5 times throughout the day to help reduce spasm and pain. Advised to follow-up PCP or return to urgent care if symptoms fail to improve over the next 7 to 10 days.

## 2021-09-17 NOTE — ED Triage Notes (Signed)
Pt states that her back has been hurting since Monday. She bent down to pick up something for a customer and her back has been hurting since. She states that she took IBU for the pain without relief.

## 2021-09-17 NOTE — ED Provider Notes (Signed)
[facility]    CSN: [id number] Arrival date & time: 09/17/21  [id number]      History   Chief Complaint Chief Complaint  Patient presents with   Back Pain    HPI Diana Jacobs is a 31 y.o. female.   31 year old female presents with lower back pain.  Patient indicates that on Monday she is working at [employer] but when she bent over to pick up an item on the floor for customer and felt her back pull.  Patient indicates since then she has been having persistent and progressive back pain, located at the L1-L2 paraspinous area.  Patient indicates that it is a pulling burning hot sensation and indicates that it has spreading all across her back.  Patient indicates the pain is worse when she sits, turns, and tries to bend.  She relates she gets relief with standing.  She indicates she has taken some ibuprofen 800 mg but this has not given her any relief.  Patient indicates that the pain is a 10 on a scale from 1-10.  Patient denies any numbness, tingling, or weakness of the lower extremities.  Patient denies any fever or chills, and is not having any urinary symptoms.   Back Pain   Past Medical History:  Diagnosis Date   ASCUS with positive high risk HPV cervical pap smears    04/17/2013, 10/22/2013 and 06/25/2014. Benign colposcopy in 2015.   Herpes    Reports being told she had herpes, no lesions    Patient Active Problem List   Diagnosis Date Noted   Low grade squamous intraepithelial lesion (LGSIL) on cervical Pap smear 07/21/2015    Past Surgical History:  Procedure Laterality Date   EYE SURGERY     EYE SURGERY     TOOTH EXTRACTION      OB History     Gravida  3   Para  1   Term  1   Preterm      AB  2   Living  1      SAB  1   IAB  1   Ectopic      Multiple      Live Births  1            Home Medications    Prior to Admission medications   Medication Sig Start Date End Date Taking? Authorizing Provider   HYDROcodone-acetaminophen (NORCO/VICODIN) 5-325 MG tablet Take 1-2 tablets by mouth every 6 (six) hours as needed. 09/17/21  Yes Nyoka Lint, PA-C  ibuprofen (ADVIL) 800 MG tablet Take 1 tablet (800 mg total) by mouth 3 (three) times daily. 09/17/21  Yes Nyoka Lint, PA-C  tiZANidine (ZANAFLEX) 4 MG tablet Take 1 tablet (4 mg total) by mouth every 6 (six) hours as needed for muscle spasms. 09/17/21  Yes Nyoka Lint, PA-C  albuterol (VENTOLIN HFA) 108 (90 Base) MCG/ACT inhaler Inhale 1-2 puffs into the lungs every 6 (six) hours as needed for wheezing or shortness of breath. 11/12/20   Hazel Sams, PA-C  metroNIDAZOLE (FLAGYL) 500 MG tablet Take 1 tablet (500 mg total) by mouth 2 (two) times daily. 05/28/21   Shelly Bombard, MD  cetirizine (ZYRTEC ALLERGY) 10 MG tablet Take 1 tablet (10 mg total) by mouth daily. 08/21/19 09/21/19  Jaynee Eagles, PA-C  etonogestrel (NEXPLANON) 68 MG IMPL implant 1 each by Subdermal route once. Inserted 05/2016  01/14/19  [provider]    Family History Family History  Problem Relation  Age of Onset   Diabetes Mother    Hypertension Mother    Diabetes Father     Social History Social History   Tobacco Use   Smoking status: Never   Smokeless tobacco: Never  Vaping Use   Vaping Use: Never used  Substance Use Topics   Alcohol use: Not Currently   Drug use: No     Allergies   Patient has no known allergies.   Review of Systems Review of Systems  Musculoskeletal:  Positive for back pain (lower back on the left).     Physical Exam Triage Vital Signs ED Triage Vitals  Enc Vitals Group     BP 09/17/21 0932 124/85     Pulse Rate 09/17/21 0932 73     Resp 09/17/21 0932 18     Temp 09/17/21 0932 97.8 F (36.6 C)     Temp Source 09/17/21 0932 Oral     SpO2 09/17/21 0932 99 %     Weight --      Height --      Head Circumference --      Peak Flow --      Pain Score 09/17/21 0930 10     Pain Loc --      Pain Edu? --      Excl. in  GC? --    No data found.  Updated Vital Signs BP 124/85 (BP Location: Left Arm)   Pulse 73   Temp 97.8 F (36.6 C) (Oral)   Resp 18   LMP 08/18/2021 (Approximate)   SpO2 99%   Visual Acuity Right Eye Distance:   Left Eye Distance:   Bilateral Distance:    Right Eye Near:   Left Eye Near:    Bilateral Near:     Physical Exam Constitutional:      Appearance: Normal appearance.  Musculoskeletal:       Back:     Comments: Back: Pain is palpated along the L1-L3 paraspinous area, no redness or swelling present, limited range of motion with pain on turning and twisting.  Negative straight leg raise bilaterally, strength is intact bilaterally.  Neurological:     Mental Status: She is alert.      UC Treatments / Results  Labs (all labs ordered are listed, but only abnormal results are displayed) Labs Reviewed - No data to display  EKG   Radiology No results found.  Procedures Procedures (including critical care time)  Medications Ordered in UC Medications - No data to display  Initial Impression / Assessment and Plan / UC Course  I have reviewed the triage vital signs and the nursing notes.  Pertinent labs & imaging results that were available during my care of the patient were reviewed by me and considered in my medical decision making (see chart for details).       Plan: 1.  Advised patient to take the Vicodin tablets 1-2 every 6-8 hours as needed for acute pain over the next 24 hours. 2.  Advised to take the Zanaflex every 6 hours to help treat the muscle spasm and irritability. 3.  Advised take ibuprofen every 8 hours with food to help reduce pain and acute swelling. 4.  Advised to use ice therapy, 10 minutes on 20 minutes off, 4-5 times throughout the day to help reduce swelling and irritability. 5.  Advised follow-up PCP or return urgent care if symptoms fail to improve Final Clinical Impressions(s) / UC Diagnoses   Final diagnoses:  Acute left-sided  low back pain without sciatica  Strain of lumbar region, initial encounter  Muscle spasm of back     Discharge Instructions      Advised to take Vicodin tablets 1-2 every 6 hours as needed for acute pain relief over the next 24 hours. Advised take the Zanaflex every 6 hours to help relieve muscle spasm and irritability. Advised take ibuprofen 800 mg every 8 hours with food to help reduce pain and acute inflammation. Advised ice therapy, 10 minutes on 20 minutes off, 4-5 times throughout the day to help reduce spasm and pain. Advised to follow-up PCP or return to urgent care if symptoms fail to improve over the next 7 to 10 days.    ED Prescriptions     Medication Sig Dispense Auth. Provider   ibuprofen (ADVIL) 800 MG tablet Take 1 tablet (800 mg total) by mouth 3 (three) times daily. 21 tablet Diana Lennox, PA-C   tiZANidine (ZANAFLEX) 4 MG tablet Take 1 tablet (4 mg total) by mouth every 6 (six) hours as needed for muscle spasms. 30 tablet Diana Lennox, PA-C   HYDROcodone-acetaminophen (NORCO/VICODIN) 5-325 MG tablet Take 1-2 tablets by mouth every 6 (six) hours as needed. 12 tablet Diana Lennox, PA-C      I have reviewed the PDMP during this encounter.   Diana Lennox, PA-C 09/17/21 [phone]

## 2021-09-19 DIAGNOSIS — H5213 Myopia, bilateral: Secondary | ICD-10-CM | POA: Diagnosis not present

## 2022-06-01 ENCOUNTER — Ambulatory Visit (HOSPITAL_COMMUNITY)
Admission: EM | Admit: 2022-06-01 | Discharge: 2022-06-01 | Disposition: A | Discharge status: Home / Self Care | Payer: Medicaid Other | Attending: Nurse Practitioner | Admitting: Nurse Practitioner

## 2022-06-01 ENCOUNTER — Ambulatory Visit (INDEPENDENT_AMBULATORY_CARE_PROVIDER_SITE_OTHER): Payer: Medicaid Other

## 2022-06-01 ENCOUNTER — Encounter (HOSPITAL_COMMUNITY): Payer: Self-pay

## 2022-06-01 DIAGNOSIS — R059 Cough, unspecified: Secondary | ICD-10-CM | POA: Diagnosis not present

## 2022-06-01 DIAGNOSIS — Z77120 Contact with and (suspected) exposure to mold (toxic): Secondary | ICD-10-CM

## 2022-06-01 MED ORDER — METHYLPREDNISOLONE 4 MG PO TBPK: ORAL_TABLET | ORAL | 0 refills | Status: DC

## 2022-06-01 MED ORDER — GUAIFENESIN 100 MG/5ML PO LIQD: 10.0000 mL | ORAL | 0 refills | Status: DC | PRN

## 2022-06-01 MED ORDER — ALBUTEROL SULFATE HFA 108 (90 BASE) MCG/ACT IN AERS: 1.0000 | INHALATION_SPRAY | Freq: Four times a day (QID) | RESPIRATORY_TRACT | 0 refills | Status: DC | PRN

## 2022-06-01 NOTE — ED Triage Notes (Signed)
Patient c/o a non productive cough since 4/27. Patient states she has mold on her mattress and in her bathroom.  Patient states she has been using cough drops.

## 2022-06-01 NOTE — Discharge Instructions (Addendum)
You have been seen today for a cough that you have had for a while that is possibly due to mold exposure.  Your lungs are clear here today and your chest x-ray is normal.  It is very important that you have your apartment assessed for and treated, if needed, for mold.  You should follow-up with the allergy and asthma specialist to determine if your symptoms are indeed due to mold toxicity.  In the meantime, I have prescribed you a course of steroids, cough medicines and an inhaler.   Go to the ED immediately if:  You cough up blood. You have trouble breathing. Your heart is beating very fast

## 2022-06-01 NOTE — ED Provider Notes (Signed)
[facility]    CSN: [id number] Arrival date & time: 06/01/22  1639      History   Chief Complaint Chief Complaint  Patient presents with   Cough    HPI Diana Jacobs is a 32 y.o. female.   Subjective:   Diana Jacobs is a 32 y.o. female here for evaluation of a cough.  The cough is non-productive and without wheezing or hemoptysis. Patient thinks that the cough is due to mold in her apartment.  Patient reports that she had a leak in her bathroom back in April.  She had placed her mattress on her bedroom floor while awaiting for other parts of her bed to be delivered.  When she went to put her bed together, she noticed that the bottom part of the mattress that was sitting on the floor was wet and had what appeared to be mold on it.  Onset of her symptoms was a couple of days after she noticed the mold. Her symptoms has been persistent since the end of April and has not changed since that time.  She reports associated shortness of breath.  Her coughing gets worse when she is trying to speak.  She has been using cough drops intermittently.  She denies any other symptoms.  Patient has been living in this apartment for the past 9 years.  She lives with her son who she reports has also been coughing.  Patient has since replaced the mattress but reports that the mold has not been addressed by the apartment complex and is still present. The water leak in her bathroom has not been fixed either and is still a problem.  Patient is specifically requesting documentation to show that her cough is due to the mold.  Patient does not have a history of asthma. Patient has not had recent travel. Patient does not have a history of smoking or vaping. Patient  has not had a previous chest x-ray.   The following portions of the patient's history were reviewed and updated as appropriate: allergies, current medications, past family history, past medical history, past social  history, past surgical history, and problem list.       Past Medical History:  Diagnosis Date   ASCUS with positive high risk HPV cervical pap smears    04/17/2013, 10/22/2013 and 06/25/2014. Benign colposcopy in 2015.   Herpes    Reports being told she had herpes, no lesions    Patient Active Problem List   Diagnosis Date Noted   Low grade squamous intraepithelial lesion (LGSIL) on cervical Pap smear 07/21/2015    Past Surgical History:  Procedure Laterality Date   EYE SURGERY     EYE SURGERY     TOOTH EXTRACTION      OB History     Gravida  3   Para  1   Term  1   Preterm      AB  2   Living  1      SAB  1   IAB  1   Ectopic      Multiple      Live Births  1            Home Medications    Prior to Admission medications   Medication Sig Start Date End Date Taking? Authorizing Provider  albuterol (VENTOLIN HFA) 108 (90 Base) MCG/ACT inhaler Inhale 1-2 puffs into the lungs every 6 (six) hours as needed for shortness of breath. 06/01/22  Yes  Lurline Idol, FNP  guaiFENesin (ROBITUSSIN) 100 MG/5ML liquid Take 10 mLs by mouth every 4 (four) hours as needed for cough. 06/01/22  Yes Lurline Idol, FNP  methylPREDNISolone (MEDROL DOSEPAK) 4 MG TBPK tablet Take as directed 06/01/22  Yes Lurline Idol, FNP  cetirizine (ZYRTEC ALLERGY) 10 MG tablet Take 1 tablet (10 mg total) by mouth daily. 08/21/19 09/21/19  Wallis Bamberg, PA-C  etonogestrel (NEXPLANON) 68 MG IMPL implant 1 each by Subdermal route once. Inserted 05/2016  01/14/19  [provider]    Family History Family History  Problem Relation Age of Onset   Diabetes Mother    Hypertension Mother    Diabetes Father     Social History Social History   Tobacco Use   Smoking status: Never   Smokeless tobacco: Never  Vaping Use   Vaping Use: Never used  Substance Use Topics   Alcohol use: Not Currently   Drug use: No     Allergies   Patient has no known  allergies.   Review of Systems Review of Systems   Physical Exam Triage Vital Signs ED Triage Vitals  Enc Vitals Group     BP 06/01/22 1743 129/82     Pulse Rate 06/01/22 1743 84     Resp 06/01/22 1743 18     Temp 06/01/22 1743 99 F (37.2 C)     Temp Source 06/01/22 1743 Oral     SpO2 06/01/22 1743 98 %     Weight --      Height --      Head Circumference --      Peak Flow --      Pain Score 06/01/22 1745 0     Pain Loc --      Pain Edu? --      Excl. in GC? --    No data found.  Updated Vital Signs BP 129/82 (BP Location: Right Arm)   Pulse 84   Temp 99 F (37.2 C) (Oral)   Resp 18   LMP 05/12/2022   SpO2 98%   Visual Acuity Right Eye Distance:   Left Eye Distance:   Bilateral Distance:    Right Eye Near:   Left Eye Near:    Bilateral Near:     Physical Exam   UC Treatments / Results  Labs (all labs ordered are listed, but only abnormal results are displayed) Labs Reviewed - No data to display  EKG   Radiology DG Chest 2 View  Result Date: 06/01/2022 CLINICAL DATA:  cough for over 1 month; mold exposure EXAM: CHEST - 2 VIEW COMPARISON:  11/12/2020 FINDINGS: The heart size and mediastinal contours are within normal limits. Both lungs are clear. The visualized skeletal structures are unremarkable. IMPRESSION: No active cardiopulmonary disease. Electronically Signed   By: Charlett Nose M.D.   On: 06/01/2022 19:18    Procedures Procedures (including critical care time)  Medications Ordered in UC Medications - No data to display  Initial Impression / Assessment and Plan / UC Course  I have reviewed the triage vital signs and the nursing notes.  Pertinent labs & imaging results that were available during my care of the patient were reviewed by me and considered in my medical decision making (see chart for details).    32 year old female presenting with a persistent cough with associated shortness of breath since May 03, 2022.  Patient thinks  that the mold in her apartment is the cause of her symptoms.  Patient has tried cough  drops intermittently for her symptoms but not anything else.  Patient is requesting documentation showing that her cough is due to the mold.  Patient is afebrile and nontoxic.  Chest x-ray unremarkable.  Advised patient to follow-up with asthma and allergy specialist to determine if this is indeed the cause of her cough.  The meantime, patient will be treated with a short course of steroids and antitussives. B-agonist inhaler as needed. She should have her apartment assessed for and treated, if needed, for mold.  Today's evaluation has revealed no signs of a dangerous process. Discussed diagnosis with patient and/or guardian. Patient and/or guardian aware of their diagnosis, possible red flag symptoms to watch out for and need for close follow up. Patient and/or guardian understands verbal and written discharge instructions. Patient and/or guardian comfortable with plan and disposition.  Patient and/or guardian has a clear mental status at this time, good insight into illness (after discussion and teaching) and has clear judgment to make decisions regarding their care  Documentation was completed with the aid of voice recognition software. Transcription may contain typographical errors. Final Clinical Impressions(s) / UC Diagnoses   Final diagnoses:  Cough, unspecified type  Suspected exposure to mold     Discharge Instructions      You have been seen today for a cough that you have had for a while that is possibly due to mold exposure.  Your lungs are clear here today and your chest x-ray is normal.  It is very important that you have your apartment assessed for and treated, if needed, for mold.  You should follow-up with the allergy and asthma specialist to determine if your symptoms are indeed due to mold toxicity.  In the meantime, I have prescribed you a course of steroids, cough medicines and an inhaler.    Go to the ED immediately if:  You cough up blood. You have trouble breathing. Your heart is beating very fast     ED Prescriptions     Medication Sig Dispense Auth. Provider   methylPREDNISolone (MEDROL DOSEPAK) 4 MG TBPK tablet Take as directed 21 tablet Aleigh Grunden, FNP   guaiFENesin (ROBITUSSIN) 100 MG/5ML liquid Take 10 mLs by mouth every 4 (four) hours as needed for cough. 118 mL Lurline Idol, FNP   albuterol (VENTOLIN HFA) 108 (90 Base) MCG/ACT inhaler Inhale 1-2 puffs into the lungs every 6 (six) hours as needed for shortness of breath. 1 each Lurline Idol, FNP      PDMP not reviewed this encounter.   Lurline Idol, [place] 06/01/22 [phone]

## 2022-08-21 ENCOUNTER — Other Ambulatory Visit: Payer: Self-pay

## 2022-08-21 ENCOUNTER — Encounter (HOSPITAL_COMMUNITY): Payer: Self-pay | Admitting: *Deleted

## 2022-08-21 ENCOUNTER — Ambulatory Visit (HOSPITAL_COMMUNITY)
Admission: EM | Admit: 2022-08-21 | Discharge: 2022-08-21 | Disposition: A | Discharge status: Home / Self Care | Payer: Medicaid Other | Attending: Emergency Medicine | Admitting: Emergency Medicine

## 2022-08-21 DIAGNOSIS — U071 COVID-19: Secondary | ICD-10-CM | POA: Diagnosis present

## 2022-08-21 MED ORDER — GUAIFENESIN ER 600 MG PO TB12: 1200.0000 mg | ORAL_TABLET | Freq: Two times a day (BID) | ORAL | 0 refills | Status: AC

## 2022-08-21 MED ORDER — PROMETHAZINE-DM 6.25-15 MG/5ML PO SYRP: 5.0000 mL | ORAL_SOLUTION | Freq: Four times a day (QID) | ORAL | 0 refills | Status: DC | PRN

## 2022-08-21 NOTE — ED Provider Notes (Signed)
[facility]    CSN: [id number] Arrival date & time: 08/21/22  1003      History   Chief Complaint Chief Complaint  Patient presents with   Cough   Headache    HPI Diana Jacobs is a 32 y.o. female.   Patient presents to clinic reporting she tested positive for COVID-19 through home test yesterday afternoon.  Friday her symptoms started with a dry cough, nasal congestion, headache, body aches and fatigue.   She has taken Advil and tried some ginger ale for her symptoms.  She denies a history of asthma, does not smoke.  No chest pain.  Reports intermittent wheezing.  Needs a PCR test and a note for work.  The history is provided by the patient and medical records.  Cough Associated symptoms: headaches, rhinorrhea and wheezing   Associated symptoms: no chest pain   Headache Associated symptoms: congestion, cough, drainage and fatigue     Past Medical History:  Diagnosis Date   ASCUS with positive high risk HPV cervical pap smears    04/17/2013, 10/22/2013 and 06/25/2014. Benign colposcopy in 2015.   Herpes    Reports being told she had herpes, no lesions    Patient Active Problem List   Diagnosis Date Noted   Low grade squamous intraepithelial lesion (LGSIL) on cervical Pap smear 07/21/2015    Past Surgical History:  Procedure Laterality Date   EYE SURGERY     EYE SURGERY     TOOTH EXTRACTION      OB History     Gravida  3   Para  1   Term  1   Preterm      AB  2   Living  1      SAB  1   IAB  1   Ectopic      Multiple      Live Births  1            Home Medications    Prior to Admission medications   Medication Sig Start Date End Date Taking? Authorizing Provider  albuterol (VENTOLIN HFA) 108 (90 Base) MCG/ACT inhaler Inhale 1-2 puffs into the lungs every 6 (six) hours as needed for shortness of breath. 06/01/22  Yes Lurline Idol, FNP  guaiFENesin (MUCINEX) 600 MG 12 hr tablet Take 2 tablets (1,200 mg  total) by mouth 2 (two) times daily for 5 days. 08/21/22 08/26/22 Yes Rinaldo Ratel, [place], FNP  promethazine-dextromethorphan (PROMETHAZINE-DM) 6.25-15 MG/5ML syrup Take 5 mLs by mouth 4 (four) times daily as needed for cough. 08/21/22  Yes Rinaldo Ratel, [place], FNP  cetirizine (ZYRTEC ALLERGY) 10 MG tablet Take 1 tablet (10 mg total) by mouth daily. 08/21/19 09/21/19  Wallis Bamberg, PA-C  etonogestrel (NEXPLANON) 68 MG IMPL implant 1 each by Subdermal route once. Inserted 05/2016  01/14/19  [provider]    Family History Family History  Problem Relation Age of Onset   Diabetes Mother    Hypertension Mother    Diabetes Father     Social History Social History   Tobacco Use   Smoking status: Never   Smokeless tobacco: Never  Vaping Use   Vaping status: Never Used  Substance Use Topics   Alcohol use: Not Currently   Drug use: No     Allergies   Patient has no known allergies.   Review of Systems Review of Systems  Constitutional:  Positive for fatigue.  HENT:  Positive for congestion, postnasal drip and rhinorrhea.  Respiratory:  Positive for cough and wheezing.   Cardiovascular:  Negative for chest pain.  Neurological:  Positive for headaches.     Physical Exam Triage Vital Signs ED Triage Vitals  Encounter Vitals Group     BP 08/21/22 1028 125/84     Systolic BP Percentile --      Diastolic BP Percentile --      Pulse Rate 08/21/22 1028 83     Resp 08/21/22 1028 20     Temp 08/21/22 1028 98.3 F (36.8 C)     Temp src --      SpO2 08/21/22 1028 91 %     Weight --      Height --      Head Circumference --      Peak Flow --      Pain Score 08/21/22 1025 0     Pain Loc --      Pain Education --      Exclude from Growth Chart --    No data found.  Updated Vital Signs BP 125/84   Pulse 83   Temp 98.3 F (36.8 C)   Resp 20   LMP 08/04/2022   SpO2 91%   Visual Acuity Right Eye Distance:   Left Eye Distance:   Bilateral Distance:    Right Eye  Near:   Left Eye Near:    Bilateral Near:     Physical Exam Vitals and nursing note reviewed.  Constitutional:      Appearance: Normal appearance.  HENT:     Head: Normocephalic and atraumatic.     Right Ear: External ear normal.     Left Ear: External ear normal.     Nose: Congestion and rhinorrhea present.     Mouth/Throat:     Mouth: Mucous membranes are moist.  Eyes:     Conjunctiva/sclera: Conjunctivae normal.  Cardiovascular:     Rate and Rhythm: Normal rate and regular rhythm.     Heart sounds: Normal heart sounds. No murmur heard. Pulmonary:     Effort: Pulmonary effort is normal. No respiratory distress.     Breath sounds: Normal breath sounds.  Musculoskeletal:        General: Normal range of motion.     Cervical back: Normal range of motion.  Skin:    General: Skin is warm and dry.  Neurological:     General: No focal deficit present.     Mental Status: She is alert and oriented to person, place, and time.  Psychiatric:        Mood and Affect: Mood normal.        Behavior: Behavior normal.      UC Treatments / Results  Labs (all labs ordered are listed, but only abnormal results are displayed) Labs Reviewed  SARS CORONAVIRUS 2 (TAT 6-24 HRS)    EKG   Radiology No results found.  Procedures Procedures (including critical care time)  Medications Ordered in UC Medications - No data to display  Initial Impression / Assessment and Plan / UC Course  I have reviewed the triage vital signs and the nursing notes.  Pertinent labs & imaging results that were available during my care of the patient were reviewed by me and considered in my medical decision making (see chart for details).  Vitals and triage reviewed, patient is hemodynamically stable.  Nasal congestion noted on physical exam.  Lungs are vesicular posteriorly, oxygen rechecked and 95% on room air.  PCR COVID-19 testing obtained, symptomatic  management discussed.  Plan of care, follow-up care  return precautions given, no questions at this time.     Final Clinical Impressions(s) / UC Diagnoses   Final diagnoses:  COVID-19     Discharge Instructions      You have been tested for COVID-19 and we will contact you if positive, your results will be available on MyChart.  You can use the cough syrup as needed, do not drink or drive on this medication as it may cause drowsiness.  For your congestion take 1200 mg of Mucinex in addition to drink at least 64 ounces of water daily to help loosen your secretions.  He can also do warm saline gargles, tea with honey and sleep with a humidifier.  Please alternate between 500 mg of Tylenol and 800 mg of ibuprofen every 4-6 hours for fever, body aches and chills.  Ensure you are using your albuterol inhaler as needed for wheezing or shortness of breath.  Return to clinic for any chest pain, worsening of wheezing, shortness of breath, if no improvement over the next 7 days, or new concerning symptoms.      ED Prescriptions     Medication Sig Dispense Auth. Provider   promethazine-dextromethorphan (PROMETHAZINE-DM) 6.25-15 MG/5ML syrup Take 5 mLs by mouth 4 (four) times daily as needed for cough. 118 mL Rinaldo Ratel, [place], FNP   guaiFENesin (MUCINEX) 600 MG 12 hr tablet Take 2 tablets (1,200 mg total) by mouth 2 (two) times daily for 5 days. 20 tablet Griffey Nicasio, [place], [place]      PDMP not reviewed this encounter.   Wilbern Pennypacker, [place] N, [place] 08/21/22 1046

## 2022-08-21 NOTE — Discharge Instructions (Addendum)
You have been tested for COVID-19 and we will contact you if positive, your results will be available on MyChart.  You can use the cough syrup as needed, do not drink or drive on this medication as it may cause drowsiness.  For your congestion take 1200 mg of Mucinex in addition to drink at least 64 ounces of water daily to help loosen your secretions.  He can also do warm saline gargles, tea with honey and sleep with a humidifier.  Please alternate between 500 mg of Tylenol and 800 mg of ibuprofen every 4-6 hours for fever, body aches and chills.  Ensure you are using your albuterol inhaler as needed for wheezing or shortness of breath.  Return to clinic for any chest pain, worsening of wheezing, shortness of breath, if no improvement over the next 7 days, or new concerning symptoms.

## 2022-08-21 NOTE — ED Triage Notes (Signed)
Pt needs a PCR for job.

## 2022-08-21 NOTE — ED Triage Notes (Signed)
Pt reports she had a positive COVID test at home yesterday . Pt has a HA,cough,muscle pain fever and runny nose.

## 2022-08-22 LAB — SARS CORONAVIRUS 2 (TAT 6-24 HRS): SARS Coronavirus 2: POSITIVE — AB

## 2022-10-11 ENCOUNTER — Emergency Department (HOSPITAL_COMMUNITY)
Admission: EM | Admit: 2022-10-11 | Discharge: 2022-10-12 | Discharge status: Against Medical Advice | Payer: Medicaid Other | Attending: Emergency Medicine | Admitting: Emergency Medicine

## 2022-10-11 ENCOUNTER — Encounter (HOSPITAL_COMMUNITY): Payer: Self-pay | Admitting: Emergency Medicine

## 2022-10-11 ENCOUNTER — Other Ambulatory Visit: Payer: Self-pay

## 2022-10-11 DIAGNOSIS — Z5321 Procedure and treatment not carried out due to patient leaving prior to being seen by health care provider: Secondary | ICD-10-CM | POA: Diagnosis not present

## 2022-10-11 DIAGNOSIS — Z3200 Encounter for pregnancy test, result unknown: Secondary | ICD-10-CM | POA: Insufficient documentation

## 2022-10-11 NOTE — ED Triage Notes (Signed)
Patient arrives for pregnancy test-LMP 8/30 and positive home pregnancy test.

## 2022-10-11 NOTE — ED Notes (Signed)
Pt called multiple times no answer 

## 2022-10-12 NOTE — ED Notes (Signed)
Called 3x for updated vitals-no answer

## 2022-10-17 ENCOUNTER — Ambulatory Visit: Payer: Medicaid Other | Admitting: Emergency Medicine

## 2022-10-17 VITALS — BP 121/82 | HR 85 | Ht 64.0 in | Wt 266.5 lb

## 2022-10-17 DIAGNOSIS — Z3201 Encounter for pregnancy test, result positive: Secondary | ICD-10-CM | POA: Diagnosis not present

## 2022-10-17 DIAGNOSIS — N912 Amenorrhea, unspecified: Secondary | ICD-10-CM | POA: Diagnosis not present

## 2022-10-17 LAB — POCT URINE PREGNANCY: Preg Test, Ur: POSITIVE — AB

## 2022-10-17 NOTE — Progress Notes (Signed)
Diana Jacobs presents today for UPT. She has no unusual complaints and complains of nausea without vomiting for 2 weeks. LMP: 09/02/2022    OBJECTIVE: Appears well, in no apparent distress.  OB History     Gravida  4   Para  1   Term  1   Preterm      AB  2   Living  1      SAB  1   IAB  1   Ectopic      Multiple      Live Births  1          Home UPT Result: Positive In-Office UPT result: Positive I have reviewed the patient's medical, obstetrical, social, and family histories, and medications.   ASSESSMENT: Positive pregnancy test  PLAN Prenatal care to be completed at: [facility]

## 2022-10-24 ENCOUNTER — Inpatient Hospital Stay (HOSPITAL_COMMUNITY): Payer: Medicaid Other

## 2022-10-24 ENCOUNTER — Inpatient Hospital Stay (HOSPITAL_COMMUNITY)
Admission: AD | Admit: 2022-10-24 | Discharge: 2022-10-24 | Disposition: A | Discharge status: Home / Self Care | Payer: Medicaid Other | Attending: Obstetrics and Gynecology | Admitting: Obstetrics and Gynecology

## 2022-10-24 ENCOUNTER — Encounter (HOSPITAL_COMMUNITY): Payer: Self-pay

## 2022-10-24 DIAGNOSIS — O26891 Other specified pregnancy related conditions, first trimester: Secondary | ICD-10-CM | POA: Diagnosis not present

## 2022-10-24 DIAGNOSIS — Z3A01 Less than 8 weeks gestation of pregnancy: Secondary | ICD-10-CM | POA: Diagnosis not present

## 2022-10-24 DIAGNOSIS — R109 Unspecified abdominal pain: Secondary | ICD-10-CM | POA: Diagnosis present

## 2022-10-24 DIAGNOSIS — Z3491 Encounter for supervision of normal pregnancy, unspecified, first trimester: Secondary | ICD-10-CM

## 2022-10-24 LAB — URINALYSIS, ROUTINE W REFLEX MICROSCOPIC
Bilirubin Urine: NEGATIVE
Glucose, UA: NEGATIVE mg/dL
Hgb urine dipstick: NEGATIVE
Ketones, ur: NEGATIVE mg/dL
Leukocytes,Ua: NEGATIVE
Nitrite: NEGATIVE
Protein, ur: NEGATIVE mg/dL
Specific Gravity, Urine: 1.029 (ref 1.005–1.030)
pH: 5 (ref 5.0–8.0)

## 2022-10-24 LAB — HCG, QUANTITATIVE, PREGNANCY: hCG, Beta Chain, Quant, S: 16810 m[IU]/mL — ABNORMAL HIGH (ref <5)

## 2022-10-24 LAB — WET PREP, GENITAL
Clue Cells Wet Prep HPF POC: NONE SEEN
Sperm: NONE SEEN
Trich, Wet Prep: NONE SEEN
WBC, Wet Prep HPF POC: <10 (ref <10)
Yeast Wet Prep HPF POC: NONE SEEN

## 2022-10-24 LAB — CBC
HCT: 37.6 % (ref 36.0–46.0)
Hemoglobin: 11.9 g/dL — ABNORMAL LOW (ref 12.0–15.0)
MCH: 25.6 pg — ABNORMAL LOW (ref 26.0–34.0)
MCHC: 31.6 g/dL (ref 30.0–36.0)
MCV: 81 fL (ref 80.0–100.0)
Platelets: 421 10*3/uL — ABNORMAL HIGH (ref 150–400)
RBC: 4.64 MIL/uL (ref 3.87–5.11)
RDW: 14.7 % (ref 11.5–15.5)
WBC: 5.5 10*3/uL (ref 4.0–10.5)
nRBC: 0 % (ref 0.0–0.2)

## 2022-10-24 LAB — ABO/RH: ABO/RH(D): B POS

## 2022-10-24 MED ORDER — FERROUS SULFATE 325 (65 FE) MG PO TABS
325.0000 mg | ORAL_TABLET | ORAL | Status: DC
  Filled 2022-10-24: qty 1

## 2022-10-24 MED ORDER — PREPLUS 27-1 MG PO TABS: 1.0000 | ORAL_TABLET | Freq: Every day | ORAL | 13 refills | Status: AC

## 2022-10-24 NOTE — MAU Note (Signed)
.  Diana Jacobs is a 32 y.o. at [redacted]w[redacted]d here in MAU reporting: she had light VB yesterday that has since stopped. She was wearing a pad throughout the day. She denies recent IC. Also has lower abdominal cramping that feels like bad menstrual cramps and started two days ago. Denies abnormal vaginal discharge or vaginal bleeding currently.   LMP: 09/02/2022 Onset of complaint: Two days ago Pain score: 8/10 Vitals:   10/24/22 0838  BP: 118/71  Pulse: 84  Resp: 18  Temp: 97.7 F (36.5 C)  SpO2: 99%     FHT:not indicated Lab orders placed from triage:   UA

## 2022-10-24 NOTE — MAU Provider Note (Signed)
None     S Ms. Adene Mizer is a 32 y.o. [phone] pregnant female at [redacted]w[redacted]d who presents to MAU today with complaint of light VB starting 10/25/22 that has since stopped with associated abdominal cramping similar to bad menstrual cramps that started 10/24/22. Current pain is 2 out of 10. Denies vaginal discharge, UTI symptoms, recent coitus / vaginal trauma, CP, SOB, palpitations, N/V, change in stool / urination. States abdominal pain has also resolved. Positive pregnancy test 10/17/22 at ER.   Pertinent items noted in HPI and remainder of comprehensive ROS otherwise negative.   O BP 118/71 (BP Location: Right Arm)   Pulse 84   Temp 97.7 F (36.5 C) (Oral)   Resp 18   Ht 5\' 4"  (1.626 m)   Wt 124.6 kg   LMP 09/02/2022   SpO2 99%   BMI 47.13 kg/m  Physical Exam Vitals and nursing note reviewed.  Constitutional:      General: She is not in acute distress.    Appearance: She is obese. She is not ill-appearing.  HENT:     Head: Normocephalic and atraumatic.     Mouth/Throat:     Mouth: Mucous membranes are moist.  Eyes:     Extraocular Movements: Extraocular movements intact.  Cardiovascular:     Rate and Rhythm: Normal rate.  Pulmonary:     Effort: Pulmonary effort is normal. No respiratory distress.  Abdominal:     General: Abdomen is flat. There is no distension.     Palpations: Abdomen is soft.     Tenderness: There is no abdominal tenderness.  Skin:    General: Skin is warm and dry.  Neurological:     General: No focal deficit present.     Mental Status: She is alert and oriented to person, place, and time.     Motor: No weakness.  Psychiatric:        Mood and Affect: Mood normal.        Behavior: Behavior normal.      MDM: MAU Course:  PUL work up -  Upreg positive 10/14 Wet Prep neg GC/CT pending ABO Bpos, no need for rhogam  CBC with stable Hgb at 11.9 (12.1 2 years ago), Plts 421 hCG 16810 [place] = [redacted]w[redacted]d IUP with cardiac activity UA =  noninfectious, normal   A&P: # [redacted] weeks gestational age # Normal IUP on prenatal [place], 1st trimester SLIUP Will follow up at [facility] on 10/30 as scheduled Reviewed return precautions for severe uncontrolled pain and heavy vaginal bleeding  Allergies as of 10/24/2022   No Known Allergies      Medication List     TAKE these medications    albuterol 108 (90 Base) MCG/ACT inhaler Commonly known as: VENTOLIN HFA Inhale 1-2 puffs into the lungs every 6 (six) hours as needed for shortness of breath.   PrePLUS 27-1 MG Tabs Take 1 tablet by mouth daily.   promethazine-dextromethorphan 6.25-15 MG/5ML syrup Commonly known as: PROMETHAZINE-DM Take 5 mLs by mouth 4 (four) times daily as needed for cough.        Lennart Pall, MD 10/24/2022 11:12 AM

## 2022-10-25 LAB — GC/CHLAMYDIA PROBE AMP (~~LOC~~) NOT AT ARMC
Chlamydia: NEGATIVE
Comment: NEGATIVE
Comment: NORMAL
Neisseria Gonorrhea: NEGATIVE

## 2022-11-02 ENCOUNTER — Other Ambulatory Visit (INDEPENDENT_AMBULATORY_CARE_PROVIDER_SITE_OTHER): Payer: Medicaid Other

## 2022-11-02 ENCOUNTER — Ambulatory Visit: Payer: Medicaid Other | Admitting: *Deleted

## 2022-11-02 VITALS — BP 115/78 | HR 66 | Wt 264.4 lb

## 2022-11-02 DIAGNOSIS — Z3481 Encounter for supervision of other normal pregnancy, first trimester: Secondary | ICD-10-CM | POA: Diagnosis not present

## 2022-11-02 DIAGNOSIS — Z3480 Encounter for supervision of other normal pregnancy, unspecified trimester: Secondary | ICD-10-CM

## 2022-11-02 DIAGNOSIS — Z3A01 Less than 8 weeks gestation of pregnancy: Secondary | ICD-10-CM

## 2022-11-02 DIAGNOSIS — O09899 Supervision of other high risk pregnancies, unspecified trimester: Secondary | ICD-10-CM | POA: Insufficient documentation

## 2022-11-02 MED ORDER — BLOOD PRESSURE KIT DEVI: 1.0000 | 0 refills | Status: AC

## 2022-11-02 NOTE — Progress Notes (Signed)
New OB Intake  I connected with Diana Jacobs  on 11/02/22 at  9:15 AM EDT by In Person Visit and verified that I am speaking with the correct person using two identifiers. Nurse is located at [facility] and pt is located at [place].  I discussed the limitations, risks, security and privacy concerns of performing an evaluation and management service by telephone and the availability of in person appointments. I also discussed with the patient that there may be a patient responsible charge related to this service. The patient expressed understanding and agreed to proceed.  I explained I am completing New OB Intake today. We discussed EDD of 06/15/2023, by Ultrasound. Pt is [name]. I reviewed her allergies, medications and Medical/Surgical/OB history.    Patient Active Problem List   Diagnosis Date Noted   Supervision of other normal pregnancy, antepartum 11/02/2022   Low grade squamous intraepithelial lesion (LGSIL) on cervical Pap smear 07/21/2015    Concerns addressed today  Delivery Plans Plans to deliver at [facility]. Discussed the nature of our practice with multiple providers including residents and students. Due to the size of the practice, the delivering provider may not be the same as those providing prenatal care.   Patient is not interested in water birth. Offered upcoming OB visit with CNM to discuss further.  MyChart/Babyscripts MyChart access verified. I explained pt will have some visits in office and some virtually. Babyscripts instructions given and order placed. Patient verifies receipt of registration text/e-mail. Account successfully created and app downloaded.  Blood Pressure Cuff/Weight Scale Blood pressure cuff ordered for patient to pick-up from [employer]. Explained after first prenatal appt pt will check weekly and document in Babyscripts. Patient does not have weight scale; patient may purchase if they desire to track weight weekly in  Babyscripts.  Anatomy US Explained first scheduled [place] will be around 19 weeks. Anatomy US scheduled for TBD at TBD.  Interested in [place]? If yes, send referral and doula dot phrase.   Is patient a candidate for Babyscripts Optimization? Yes  First visit review I reviewed new OB appt with patient. Explained pt will be seen by Dr. Debroah Loop at first visit. Discussed Avelina Laine genetic screening with patient. Requests Panorama and Horizon.. Routine prenatal labs collected at today's visit.   Last Pap Diagnosis  Date Value Ref Range Status  05/25/2021   Final   - Negative for Intraepithelial Lesions or Malignancy (NILM)  05/25/2021 - Benign reactive/reparative changes  Final    [name], RN 11/02/2022  9:54 AM

## 2022-11-02 NOTE — Progress Notes (Signed)
Patient reports moving to [facility] on 11/05/22. Encouraged to identify OB/GYN in that area and sign ROI so her records, including today's visit, can be sent to them. Pt verbalized understanding.

## 2022-11-03 LAB — CBC/D/PLT+RPR+RH+ABO+RUBIGG...
Antibody Screen: NEGATIVE
Basophils Absolute: 0 10*3/uL (ref 0.0–0.2)
Basos: 0 %
EOS (ABSOLUTE): 0 10*3/uL (ref 0.0–0.4)
Eos: 0 %
HCV Ab: NONREACTIVE
HIV Screen 4th Generation wRfx: NONREACTIVE
Hematocrit: 36.9 % (ref 34.0–46.6)
Hemoglobin: 11.6 g/dL (ref 11.1–15.9)
Hepatitis B Surface Ag: NEGATIVE
Immature Grans (Abs): 0 10*3/uL (ref 0.0–0.1)
Immature Granulocytes: 0 %
Lymphocytes Absolute: 1.6 10*3/uL (ref 0.7–3.1)
Lymphs: 31 %
MCH: 26.8 pg (ref 26.6–33.0)
MCHC: 31.4 g/dL — ABNORMAL LOW (ref 31.5–35.7)
MCV: 85 fL (ref 79–97)
Monocytes Absolute: 0.4 10*3/uL (ref 0.1–0.9)
Monocytes: 8 %
Neutrophils Absolute: 3.2 10*3/uL (ref 1.4–7.0)
Neutrophils: 61 %
Platelets: 410 10*3/uL (ref 150–450)
RBC: 4.33 x10E6/uL (ref 3.77–5.28)
RDW: 15.8 % — ABNORMAL HIGH (ref 11.7–15.4)
RPR Ser Ql: NONREACTIVE
Rh Factor: POSITIVE
Rubella Antibodies, IGG: 5.2 {index} (ref >0.99)
WBC: 5.2 10*3/uL (ref 3.4–10.8)

## 2022-11-03 LAB — HEMOGLOBIN A1C
Est. average glucose Bld gHb Est-mCnc: 117 mg/dL
Hgb A1c MFr Bld: 5.7 % — ABNORMAL HIGH (ref 4.8–5.6)

## 2022-11-03 LAB — COMPREHENSIVE METABOLIC PANEL
ALT: 11 [IU]/L (ref 0–32)
AST: 15 [IU]/L (ref 0–40)
Albumin: 4.3 g/dL (ref 3.9–4.9)
Alkaline Phosphatase: 78 [IU]/L (ref 44–121)
BUN/Creatinine Ratio: 10 (ref 9–23)
BUN: 8 mg/dL (ref 6–20)
Bilirubin Total: 0.4 mg/dL (ref 0.0–1.2)
CO2: 21 mmol/L (ref 20–29)
Calcium: 9.4 mg/dL (ref 8.7–10.2)
Chloride: 99 mmol/L (ref 96–106)
Creatinine, Ser: 0.84 mg/dL (ref 0.57–1.00)
Globulin, Total: 3.4 g/dL (ref 1.5–4.5)
Glucose: 75 mg/dL (ref 70–99)
Potassium: 4.2 mmol/L (ref 3.5–5.2)
Sodium: 136 mmol/L (ref 134–144)
Total Protein: 7.7 g/dL (ref 6.0–8.5)
eGFR: 93 mL/min/{1.73_m2} (ref >59)

## 2022-11-03 LAB — HCV INTERPRETATION

## 2022-11-04 LAB — CULTURE, OB URINE

## 2022-11-04 LAB — URINE CULTURE, OB REFLEX

## 2022-12-08 ENCOUNTER — Ambulatory Visit (INDEPENDENT_AMBULATORY_CARE_PROVIDER_SITE_OTHER): Payer: Medicaid Other | Admitting: Obstetrics & Gynecology

## 2022-12-08 DIAGNOSIS — Z3A13 13 weeks gestation of pregnancy: Secondary | ICD-10-CM | POA: Diagnosis not present

## 2022-12-08 DIAGNOSIS — O99211 Obesity complicating pregnancy, first trimester: Secondary | ICD-10-CM | POA: Diagnosis not present

## 2022-12-08 DIAGNOSIS — O99210 Obesity complicating pregnancy, unspecified trimester: Secondary | ICD-10-CM | POA: Insufficient documentation

## 2022-12-08 DIAGNOSIS — Z3480 Encounter for supervision of other normal pregnancy, unspecified trimester: Secondary | ICD-10-CM

## 2022-12-08 MED ORDER — VITAMIN B-6 50 MG PO TABS: 50.0000 mg | ORAL_TABLET | Freq: Two times a day (BID) | ORAL | 2 refills | Status: DC

## 2022-12-08 NOTE — Progress Notes (Signed)
  Subjective:    Diana Jacobs is a [id number] [redacted]w[redacted]d being seen today for her first obstetrical visit.  Her obstetrical history is significant for obesity. Patient does intend to breast feed. Pregnancy history fully reviewed.  Patient reports nausea.  There were no vitals filed for this visit.  HISTORY: OB History  Gravida Para Term Preterm AB Living  4 1 1   2 1   SAB IAB Ectopic Multiple Live Births  1 1     1     # Outcome Date GA Lbr Len/2nd Weight Sex Type Anes PTL Lv  4 Current           3 Term 02/28/13 [redacted]w[redacted]d / 01:22 8 lb 4.8 oz (3.765 kg) M Vag-Spont EPI  LIV  2 IAB 2015          1 SAB 02/02/11             Birth Comments: 7wks   Past Medical History:  Diagnosis Date   ASCUS with positive high risk HPV cervical pap smears    04/17/2013, 10/22/2013 and 06/25/2014. Benign colposcopy in 2015.   Herpes    Reports being told she had herpes, no lesions   Past Surgical History:  Procedure Laterality Date   EYE SURGERY     EYE SURGERY     TOOTH EXTRACTION     Family History  Problem Relation Age of Onset   Diabetes Mother    Hypertension Mother    Diabetes Father      Exam    Uterus:   13 week  Pelvic Exam:    Perineum: No Hemorrhoids   Vulva: normal   Vagina:  normal mucosa   pH:    Cervix: no lesions   Adnexa: normal adnexa   Bony Pelvis: average  System: Breast:     Skin: normal coloration and turgor, no rashes    Neurologic: oriented, normal mood   Extremities: normal strength, tone, and muscle mass   HEENT PERRLA   Mouth/Teeth mucous membranes moist, pharynx normal without lesions   Neck supple   Cardiovascular: regular rate and rhythm   Respiratory:  appears well, vitals normal, no respiratory distress, acyanotic, normal RR   Abdomen: soft, non-tender; bowel sounds normal; no masses,  no organomegaly   Urinary:       Assessment:    Pregnancy: [id number] Patient Active Problem List   Diagnosis Date Noted   Obesity during pregnancy  12/08/2022   Supervision of other normal pregnancy, antepartum 11/02/2022        Plan:     Initial labs drawn. Prenatal vitamins. Problem list reviewed and updated. Genetic Screening discussed : ordered.  Ultrasound discussed; fetal survey: ordered.  Follow up in 4 weeks. 50% of 30 min visit spent on counseling and coordination of care.   Scheryl Darter 12/08/2022

## 2022-12-13 ENCOUNTER — Other Ambulatory Visit: Payer: Self-pay

## 2022-12-13 DIAGNOSIS — Z3480 Encounter for supervision of other normal pregnancy, unspecified trimester: Secondary | ICD-10-CM

## 2022-12-13 DIAGNOSIS — Z3A13 13 weeks gestation of pregnancy: Secondary | ICD-10-CM

## 2022-12-19 ENCOUNTER — Encounter (HOSPITAL_COMMUNITY): Payer: Self-pay | Admitting: Obstetrics & Gynecology

## 2022-12-19 ENCOUNTER — Inpatient Hospital Stay (HOSPITAL_COMMUNITY)
Admission: AD | Admit: 2022-12-19 | Discharge: 2022-12-19 | Disposition: A | Discharge status: Home / Self Care | Payer: Medicaid Other | Attending: Obstetrics & Gynecology | Admitting: Obstetrics & Gynecology

## 2022-12-19 DIAGNOSIS — O23592 Infection of other part of genital tract in pregnancy, second trimester: Secondary | ICD-10-CM | POA: Insufficient documentation

## 2022-12-19 DIAGNOSIS — B3731 Acute candidiasis of vulva and vagina: Secondary | ICD-10-CM

## 2022-12-19 DIAGNOSIS — O98812 Other maternal infectious and parasitic diseases complicating pregnancy, second trimester: Secondary | ICD-10-CM | POA: Insufficient documentation

## 2022-12-19 DIAGNOSIS — O26899 Other specified pregnancy related conditions, unspecified trimester: Secondary | ICD-10-CM

## 2022-12-19 DIAGNOSIS — B9689 Other specified bacterial agents as the cause of diseases classified elsewhere: Secondary | ICD-10-CM | POA: Insufficient documentation

## 2022-12-19 DIAGNOSIS — Z3A14 14 weeks gestation of pregnancy: Secondary | ICD-10-CM

## 2022-12-19 DIAGNOSIS — O26892 Other specified pregnancy related conditions, second trimester: Secondary | ICD-10-CM | POA: Diagnosis not present

## 2022-12-19 DIAGNOSIS — R102 Pelvic and perineal pain: Secondary | ICD-10-CM | POA: Diagnosis not present

## 2022-12-19 LAB — URINALYSIS, ROUTINE W REFLEX MICROSCOPIC
Bilirubin Urine: NEGATIVE
Glucose, UA: NEGATIVE mg/dL
Hgb urine dipstick: NEGATIVE
Ketones, ur: NEGATIVE mg/dL
Leukocytes,Ua: NEGATIVE
Nitrite: NEGATIVE
Protein, ur: NEGATIVE mg/dL
Specific Gravity, Urine: 1.02 (ref 1.005–1.030)
pH: 6 (ref 5.0–8.0)

## 2022-12-19 LAB — PANORAMA PRENATAL TEST FULL PANEL:PANORAMA TEST PLUS 5 ADDITIONAL MICRODELETIONS: FETAL FRACTION: 3

## 2022-12-19 LAB — WET PREP, GENITAL
Clue Cells Wet Prep HPF POC: NONE SEEN
Sperm: NONE SEEN
Trich, Wet Prep: NONE SEEN
WBC, Wet Prep HPF POC: <10 (ref <10)

## 2022-12-19 MED ORDER — FLUCONAZOLE 150 MG PO TABS: 150.0000 mg | ORAL_TABLET | ORAL | 0 refills | Status: DC

## 2022-12-19 MED ORDER — IBUPROFEN 600 MG PO TABS
600.0000 mg | ORAL_TABLET | Freq: Once | ORAL | Status: AC
  Administered 2022-12-19: 600 mg via ORAL
  Filled 2022-12-19: qty 1

## 2022-12-19 NOTE — MAU Provider Note (Signed)
History     CSN: [id number]  Arrival date and time: 12/19/22 1019   Event Date/Time   First Provider Initiated Contact with Patient 12/19/22 1228      Chief Complaint  Patient presents with   Abdominal Pain   Pelvic Pain   HPI [name] is a 32 y.o. year old G64P1021 female at [redacted]w[redacted]d weeks gestation who presents to MAU reporting intermittent lower abdominal cramping that "feel ike contractions every 5 mins" since 0830. She reports having increased urination throughout pregnancy. She states she feels as though she can't empty her bladder when she urinates. She has a known live IUP from 11/02/2022 U/S. She receives [facility] with [facility]; next appt is 01/05/2023. Her sister is present and contributing to the history taking.  .  OB History     Gravida  4   Para  1   Term  1   Preterm      AB  2   Living  1      SAB  1   IAB  1   Ectopic      Multiple      Live Births  1           Past Medical History:  Diagnosis Date   ASCUS with positive high risk HPV cervical pap smears    04/17/2013, 10/22/2013 and 06/25/2014. Benign colposcopy in 2015.   Herpes    Reports being told she had herpes, no lesions    Past Surgical History:  Procedure Laterality Date   EYE SURGERY     EYE SURGERY     TOOTH EXTRACTION      Family History  Problem Relation Age of Onset   Diabetes Mother    Hypertension Mother    Diabetes Father     Social History   Tobacco Use   Smoking status: Never   Smokeless tobacco: Never  Vaping Use   Vaping status: Never Used  Substance Use Topics   Alcohol use: Not Currently   Drug use: No    Allergies: No Known Allergies  No medications prior to admission.    Review of Systems  Constitutional: Negative.   HENT: Negative.    Eyes: Negative.   Respiratory: Negative.    Cardiovascular: Negative.   Gastrointestinal: Negative.   Endocrine: Negative.   Genitourinary:  Positive for frequency and pelvic pain (like  contractions every 5 mins).  Musculoskeletal: Negative.   Skin: Negative.   Allergic/Immunologic: Negative.   Neurological: Negative.   Hematological: Negative.   Psychiatric/Behavioral: Negative.     Physical Exam   Blood pressure 128/70, pulse 90, temperature 98.2 F (36.8 C), temperature source Oral, resp. rate 16, height 5\' 4"  (1.626 m), weight 121.7 kg, last menstrual period 09/02/2022, SpO2 99%.  Physical Exam Vitals and nursing note reviewed.  Constitutional:      Appearance: Normal appearance.  Pulmonary:     Effort: Pulmonary effort is normal.  Abdominal:     Palpations: Abdomen is soft.     Tenderness: There is abdominal tenderness (mid pelvis over bladder).  Genitourinary:    Comments: Swabs collected by RN using blind swab technique  Musculoskeletal:        General: Normal range of motion.  Skin:    General: Skin is warm and dry.  Neurological:     Mental Status: She is alert and oriented to person, place, and time.  Psychiatric:        Mood and Affect: Mood normal.  Behavior: Behavior normal.        Thought Content: Thought content normal.        Judgment: Judgment normal.    FHTs by doppler: unable to hear with doppler  MAU Course  Procedures Patient informed that the ultrasound is considered a limited OB ultrasound and is not intended to be a complete ultrasound exam.  Patient also informed that the ultrasound is not being completed with the intent of assessing for fetal or placental anomalies or any pelvic abnormalities.  Explained that the purpose of today's ultrasound is to assess for viability.  Baby was found to be in a transverse presentation. Baby was very active with (+) cardiac activity. Pictures taken  and given to patient. Patient reassured of fetal well-being after seeing on ultrasound. Patient acknowledges the purpose of the exam and the limitations of the study.  MDM CCUA Wet Prep GC/CT -- Results pending  Ibuprofen 600 mg po -- pain  relieved Informal BS U/S  Results for orders placed or performed during the hospital encounter of 12/19/22 (from the past 24 hours)  Urinalysis, Routine w reflex microscopic -Urine, Clean Catch     Status: None   Collection Time: 12/19/22 10:40 AM  Result Value Ref Range   Color, Urine YELLOW YELLOW   APPearance CLEAR CLEAR   Specific Gravity, Urine 1.020 1.005 - 1.030   pH 6.0 5.0 - 8.0   Glucose, UA NEGATIVE NEGATIVE mg/dL   Hgb urine dipstick NEGATIVE NEGATIVE   Bilirubin Urine NEGATIVE NEGATIVE   Ketones, ur NEGATIVE NEGATIVE mg/dL   Protein, ur NEGATIVE NEGATIVE mg/dL   Nitrite NEGATIVE NEGATIVE   Leukocytes,Ua NEGATIVE NEGATIVE  Wet prep, genital     Status: Abnormal   Collection Time: 12/19/22 12:50 PM   Specimen: PATH Cytology Cervicovaginal Ancillary Only  Result Value Ref Range   Yeast Wet Prep HPF POC PRESENT (A) NONE SEEN   Trich, Wet Prep NONE SEEN NONE SEEN   Clue Cells Wet Prep HPF POC NONE SEEN NONE SEEN   WBC, Wet Prep HPF POC <10 <10   Sperm NONE SEEN     Assessment and Plan  1. Candida vaginitis (Primary) - Information provided on yeast  - Prescription for: Diflucan 150 mg po every 3 days x 3 doses  2. Abdominal cramping affecting pregnancy, antepartum - Information provided on abdominal cramping in pregnancy   3. [redacted] weeks gestation of pregnancy   - Discharge patient - Keep scheduled appt with [facility] on 01/05/2023 - Patient verbalized an understanding of the plan of care and agrees.   Raelyn Mora, CNM 12/19/2022, 12:41 PM

## 2022-12-19 NOTE — MAU Note (Signed)
.  Diana Jacobs is a 32 y.o. at [redacted]w[redacted]d here in MAU reporting: Intermittent lower abdominal cramps that feel like contractions since 0830 this morning. She reports the pains have been occurring every five minutes. She reports she has had increased urination throughout her entire pregnancy as well as not feeling as if she is emptying her bladder when she urinates. Denies pain or burning with urination Denies VB or vaginal discharge. Denies recent IC.  Known IUP. Last [place] on 10/30: Single live IUP,  EDD previously established. Fetal cardiac activity observed. Appearance consistent with expected GA.  Onset of complaint: 0830 this morning - pain Pain score: 7/10 lower abdomen  Lab orders placed from triage: UA

## 2022-12-19 NOTE — Discharge Instructions (Signed)
Take Diflucan pills as prescribed (1 pill every 3 days until gone)

## 2022-12-20 LAB — HORIZON CUSTOM: REPORT SUMMARY: NEGATIVE

## 2022-12-20 LAB — GC/CHLAMYDIA PROBE AMP (~~LOC~~) NOT AT ARMC
Chlamydia: NEGATIVE
Comment: NEGATIVE
Comment: NORMAL
Neisseria Gonorrhea: NEGATIVE

## 2022-12-21 ENCOUNTER — Ambulatory Visit (HOSPITAL_COMMUNITY): Payer: Medicaid Other

## 2022-12-21 ENCOUNTER — Encounter (HOSPITAL_COMMUNITY): Payer: Self-pay | Admitting: *Deleted

## 2022-12-21 ENCOUNTER — Ambulatory Visit (HOSPITAL_COMMUNITY)
Admission: EM | Admit: 2022-12-21 | Discharge: 2022-12-21 | Disposition: A | Discharge status: Home / Self Care | Payer: Medicaid Other | Attending: Family Medicine | Admitting: Family Medicine

## 2022-12-21 ENCOUNTER — Other Ambulatory Visit: Payer: Self-pay

## 2022-12-21 DIAGNOSIS — M79672 Pain in left foot: Secondary | ICD-10-CM | POA: Diagnosis not present

## 2022-12-21 NOTE — Discharge Instructions (Signed)
Take Tylenol for pain.

## 2022-12-21 NOTE — ED Triage Notes (Signed)
Pt reports she ran into the bed rail today. Pt has pain to the Lt 3rd toe.

## 2022-12-22 NOTE — ED Provider Notes (Signed)
  [facility]   [id number] 12/21/22 Arrival Time: 1833  ASSESSMENT & PLAN:  1. Left foot pain    Declines imaging. Placed in post-op shoe for comfort. Work note provided. Likely broken toe.  OTC Tylenol as needed.  Recommend:  Follow-up Information     [facility].   Specialty: Internal Medicine Why: As needed. Contact information: [address] [place] [address] [phone]                Reviewed expectations re: course of current medical issues. Questions answered. Outlined signs and symptoms indicating need for more acute intervention. Patient verbalized understanding. After Visit Summary given.  SUBJECTIVE: History from: patient. Diana Jacobs is a 32 y.o. female who reports kicking bedrail today with L foot. L 3rd toe pain. Is ambulatory. No tx PTA. Is pregnant; 15w.   Past Surgical History:  Procedure Laterality Date   EYE SURGERY     EYE SURGERY     TOOTH EXTRACTION        OBJECTIVE:  Vitals:   12/21/22 1925  BP: 107/73  Pulse: 90  Resp: 20  Temp: 99.2 F (37.3 C)  SpO2: 96%    General appearance: alert; no distress HEENT: [place]; AT Neck: supple with FROM Resp: unlabored respirations Extremities: LLE: warm with well perfused appearance; very TTP over L toe with minimal swelling CV: brisk extremity capillary refill of LLE; 2+ DP pulse of LLE. Skin: warm and dry; no visible rashes Neurologic: gait normal; normal sensation and strength of LLE Psychological: alert and cooperative; normal mood and affect  Imaging: No results found.    No Known Allergies  Past Medical History:  Diagnosis Date   ASCUS with positive high risk HPV cervical pap smears    04/17/2013, 10/22/2013 and 06/25/2014. Benign colposcopy in 2015.   Herpes    Reports being told she had herpes, no lesions   Social History   Socioeconomic History   Marital status: Single    Spouse name: Not on file   Number of children:  Not on file   Years of education: Not on file   Highest education level: Not on file  Occupational History   Not on file  Tobacco Use   Smoking status: Never   Smokeless tobacco: Never  Vaping Use   Vaping status: Never Used  Substance and Sexual Activity   Alcohol use: Not Currently   Drug use: No   Sexual activity: Yes    Birth control/protection: None  Other Topics Concern   Not on file  Social History Narrative   Not on file   Social Drivers of Health   Financial Resource Strain: Not on file  Food Insecurity: Not on file  Transportation Needs: Not on file  Physical Activity: Not on file  Stress: Not on file  Social Connections: Unknown (05/18/2021)   Received from [facility]   Social Network    Social Network: Not on file   Family History  Problem Relation Age of Onset   Diabetes Mother    Hypertension Mother    Diabetes Father    Past Surgical History:  Procedure Laterality Date   EYE SURGERY     EYE SURGERY     TOOTH EXTRACTION         Mardella Layman, MD 12/22/22 [phone]

## 2023-01-04 NOTE — L&D Delivery Note (Signed)
 OB/GYN Faculty Practice Delivery Note  Diana Jacobs is a 33 y.o. [id number] s/p SVD at [redacted]w[redacted]d. She was admitted for gHTN.   ROM: 7h 68m with clear fluid GBS Status: Positive/-- (05/16 1024) Maximum Maternal Temperature: Temp (24hrs), Avg:98.1 F (36.7 C), Min:97.8 F (36.6 C), Max:98.4 F (36.9 C)   Labor Progress: Initial SVE: 1.5/thick. AROM and Pitocin  required. She then progressed to complete.   Delivery Date/Time: 06/02/23 2132 Delivery: Called to room and patient was feeling urge to push. Found to be complete/+2. Deep variables with pushing. Made quick progress. Head delivered OA to ROA. No nuchal cord present. Shoulder and body delivered in usual fashion. Infant with spontaneous cry, placed on mother's abdomen, dried and stimulated. Cord clamped x 2 after +1-minute delay, and cut by FOB. Cord gases not obtained. Cord blood drawn. Placenta delivered spontaneously with gentle cord traction. Fundus firm with massage and Pitocin . Labia, perineum, and vagina inspected with hemostatic right labial laceration. Mom and baby to postpartum. Baby Weight: pending  Placenta: 3 vessel, intact. Sent to L&D Complications: None Lacerations: R labial - no repair EBL: 100 mL Anesthesia: epidural  Infant: baby girl [name] APGAR (1 MIN): 8  APGAR (5 MINS): 8  APGAR (10 MINS):    Maud Sorenson, MD [facility] Family Medicine Fellow, [facility] for [facility], [facility] 06/02/2023, 9:45 PM

## 2023-01-05 ENCOUNTER — Ambulatory Visit (INDEPENDENT_AMBULATORY_CARE_PROVIDER_SITE_OTHER): Payer: Medicaid Other | Admitting: Obstetrics & Gynecology

## 2023-01-05 VITALS — BP 125/79 | HR 79 | Wt 273.4 lb

## 2023-01-05 DIAGNOSIS — Z3480 Encounter for supervision of other normal pregnancy, unspecified trimester: Secondary | ICD-10-CM

## 2023-01-05 DIAGNOSIS — O09899 Supervision of other high risk pregnancies, unspecified trimester: Secondary | ICD-10-CM

## 2023-01-05 DIAGNOSIS — R7309 Other abnormal glucose: Secondary | ICD-10-CM

## 2023-01-05 DIAGNOSIS — Z3A17 17 weeks gestation of pregnancy: Secondary | ICD-10-CM

## 2023-01-05 DIAGNOSIS — O99210 Obesity complicating pregnancy, unspecified trimester: Secondary | ICD-10-CM

## 2023-01-05 DIAGNOSIS — O24410 Gestational diabetes mellitus in pregnancy, diet controlled: Secondary | ICD-10-CM | POA: Insufficient documentation

## 2023-01-05 MED ORDER — ASPIRIN 81 MG PO CHEW: 81.0000 mg | CHEWABLE_TABLET | Freq: Every day | ORAL | 3 refills | Status: DC

## 2023-01-05 NOTE — Progress Notes (Signed)
   PRENATAL VISIT NOTE  Subjective:  Diana Jacobs is a 33 y.o. [name] at [redacted]w[redacted]d being seen today for ongoing prenatal care.  She is currently monitored for the following issues for this high-risk pregnancy and has Supervision of other normal pregnancy, antepartum; Obesity during pregnancy; and Abnormal glucose measurement on their problem list.  Patient reports no complaints.  Contractions: Not present. Vag. Bleeding: None.  Movement: Present. Denies leaking of fluid.   The following portions of the patient's history were reviewed and updated as appropriate: allergies, current medications, past family history, past medical history, past social history, past surgical history and problem list.   Objective:   Vitals:   01/05/23 0900  BP: 125/79  Pulse: 79  Weight: 273 lb 6.4 oz (124 kg)    Fetal Status:     Movement: Present     General:  Alert, oriented and cooperative. Patient is in no acute distress.  Skin: Skin is warm and dry. No rash noted.   Cardiovascular: Normal heart rate noted  Respiratory: Normal respiratory effort, no problems with respiration noted  Abdomen: Soft, gravid, appropriate for gestational age.  Pain/Pressure: Absent     Pelvic: Cervical exam deferred        Extremities: Normal range of motion.  Edema: None  Mental Status: Normal mood and affect. Normal behavior. Normal judgment and thought content.   Assessment and Plan:  Pregnancy: [name] at [redacted]w[redacted]d 1. [redacted] weeks gestation of pregnancy (Primary)  - AFP, Serum, Open Spina Bifid   3. Abnormal glucose measurement - needs early 1 hour glucola   4. Obesity during pregnancy - baby asa - check pr/cr ratio for baseline  Preterm labor symptoms and general obstetric precautions including but not limited to vaginal bleeding, contractions, leaking of fluid and fetal movement were reviewed in detail with the patient. Please refer to After Visit Summary for other counseling recommendations.   Return  in about 4 weeks (around 02/02/2023) for ROB visit.  Future Appointments  Date Time Provider Department Center  01/30/2023 10:15 AM [facility] NURSE [facility]  01/30/2023 10:30 AM [facility] US3 [facility]    Harland JAYSON Birkenhead, MD

## 2023-01-06 LAB — PROTEIN / CREATININE RATIO, URINE
Creatinine, Urine: 223.7 mg/dL
Protein, Ur: 19.4 mg/dL
Protein/Creat Ratio: 87 mg/g{creat} (ref 0–200)

## 2023-01-07 LAB — AFP, SERUM, OPEN SPINA BIFIDA
AFP MoM: 0.58
AFP Value: 17.1 ng/mL
Gest. Age on Collection Date: 17 wk
Maternal Age At EDD: 34.6 a
OSBR Risk 1 IN: 10000
Test Results:: NEGATIVE
Weight: 273 [lb_av]

## 2023-01-13 ENCOUNTER — Telehealth: Payer: Self-pay | Admitting: *Deleted

## 2023-01-13 NOTE — Telephone Encounter (Signed)
 RTC regarding taking dulcolax in pregnancy. Advised pt against taking medication. Gave standard constipation in pregnancy advise per protocol and sent [facility] msg with safe medications in pregnancy. Pt verbalized understanding.

## 2023-01-19 ENCOUNTER — Other Ambulatory Visit: Payer: Medicaid Other

## 2023-01-19 DIAGNOSIS — Z3A19 19 weeks gestation of pregnancy: Secondary | ICD-10-CM

## 2023-01-19 DIAGNOSIS — Z3480 Encounter for supervision of other normal pregnancy, unspecified trimester: Secondary | ICD-10-CM

## 2023-01-23 LAB — GLUCOSE TOLERANCE, 1 HOUR: Glucose, 1Hr PP: 150 mg/dL (ref 70–199)

## 2023-01-26 ENCOUNTER — Other Ambulatory Visit: Payer: Medicaid Other

## 2023-01-26 DIAGNOSIS — O09899 Supervision of other high risk pregnancies, unspecified trimester: Secondary | ICD-10-CM

## 2023-01-26 DIAGNOSIS — R7309 Other abnormal glucose: Secondary | ICD-10-CM

## 2023-01-26 DIAGNOSIS — Z3A20 20 weeks gestation of pregnancy: Secondary | ICD-10-CM

## 2023-01-27 LAB — GLUCOSE TOLERANCE, 2 HOURS W/ 1HR
Glucose, 1 hour: 121 mg/dL (ref 70–179)
Glucose, 2 hour: 110 mg/dL (ref 70–152)
Glucose, Fasting: 81 mg/dL (ref 70–91)

## 2023-01-30 ENCOUNTER — Encounter: Payer: Self-pay | Admitting: *Deleted

## 2023-01-30 ENCOUNTER — Encounter: Payer: Self-pay | Admitting: Obstetrics and Gynecology

## 2023-01-30 ENCOUNTER — Ambulatory Visit: Payer: Medicaid Other | Attending: Obstetrics and Gynecology

## 2023-01-30 ENCOUNTER — Ambulatory Visit: Payer: Medicaid Other | Admitting: *Deleted

## 2023-01-30 ENCOUNTER — Other Ambulatory Visit: Payer: Self-pay | Admitting: *Deleted

## 2023-01-30 ENCOUNTER — Other Ambulatory Visit: Payer: Self-pay

## 2023-01-30 ENCOUNTER — Ambulatory Visit (HOSPITAL_BASED_OUTPATIENT_CLINIC_OR_DEPARTMENT_OTHER): Payer: Medicaid Other | Admitting: Obstetrics and Gynecology

## 2023-01-30 VITALS — BP 122/65 | HR 73

## 2023-01-30 DIAGNOSIS — O43192 Other malformation of placenta, second trimester: Secondary | ICD-10-CM | POA: Diagnosis not present

## 2023-01-30 DIAGNOSIS — O283 Abnormal ultrasonic finding on antenatal screening of mother: Secondary | ICD-10-CM

## 2023-01-30 DIAGNOSIS — O99212 Obesity complicating pregnancy, second trimester: Secondary | ICD-10-CM | POA: Diagnosis present

## 2023-01-30 DIAGNOSIS — E6689 Other obesity not elsewhere classified: Secondary | ICD-10-CM | POA: Insufficient documentation

## 2023-01-30 DIAGNOSIS — Z3480 Encounter for supervision of other normal pregnancy, unspecified trimester: Secondary | ICD-10-CM | POA: Diagnosis not present

## 2023-01-30 DIAGNOSIS — Z363 Encounter for antenatal screening for malformations: Secondary | ICD-10-CM | POA: Diagnosis not present

## 2023-01-30 DIAGNOSIS — Z3689 Encounter for other specified antenatal screening: Secondary | ICD-10-CM | POA: Diagnosis not present

## 2023-01-30 DIAGNOSIS — Z3A19 19 weeks gestation of pregnancy: Secondary | ICD-10-CM | POA: Insufficient documentation

## 2023-01-30 NOTE — Progress Notes (Signed)
Maternal-Fetal Medicine-Consultation  I had the pleasure of seeing [name] today at [facility]. She is G4 P1021 at 20w 4d gestation and is here for fetal anatomy scan.  On cell-free fetal DNA screening, the risks of fetal aneuploidies are not increased.  MSAFP screening showed low risk for open neural tube defects.  She does not have gestational diabetes. Obstetrical history significant for a term vaginal delivery in 2015 of a female infant weighing 8 pounds and 4 ounces at birth.  Her son is in good health.  She does not report any chronic medical conditions including diabetes or hypertension.  Her pregravid BMI is 47.  Ultrasound We performed a fetal anatomical survey.  Amniotic fluid is normal and good fetal activity seen.  Fetal biometry is consistent with the previously established dates.  An echogenic intracardiac focus is seen.  No other markers of aneuploidies or obvious fetal structural defects are seen. Marginal cord insertion is seen. Patient understands the limitations of ultrasound in detecting fetal anomalies.  As maternal obesity imposes limitations on the resolution of images, fetal anomalies may be missed.  Our concerns include: Echogenic intracardiac focus (EIF) EIF is seen in about 3% or 4% of normal fetuses and in some fetuses with Down syndrome. Given that she had low risk for fetal Down syndrome on cell-free fetal DNA screening, the risk for fetal Down syndrome is not increased. I reassured the patient that EIF is not associated with fetal structural heart disease.  Marginal cord insertion I counseled the patient on the findings with help of diagrams. Marginal cord insertion is associated with an increased risk for fetal growth restriction in some cases. Usually, they have good outcomes. We will review our diagnosis on subsequent scans.  Recommendations -An appointment was made for her to return in weeks for completion of fetal anatomy  scan.  Thank you for consultation.  If you have any questions or concerns, please contact me the [facility].  Consultation including face-to-face (more than 50%) counseling 30 minutes.

## 2023-02-02 ENCOUNTER — Encounter: Payer: Self-pay | Admitting: Obstetrics and Gynecology

## 2023-02-02 ENCOUNTER — Ambulatory Visit: Payer: Medicaid Other | Admitting: Obstetrics and Gynecology

## 2023-02-02 VITALS — BP 129/82 | HR 83 | Wt 276.5 lb

## 2023-02-02 DIAGNOSIS — Z3A21 21 weeks gestation of pregnancy: Secondary | ICD-10-CM

## 2023-02-02 DIAGNOSIS — O43192 Other malformation of placenta, second trimester: Secondary | ICD-10-CM

## 2023-02-02 DIAGNOSIS — Z3480 Encounter for supervision of other normal pregnancy, unspecified trimester: Secondary | ICD-10-CM

## 2023-02-02 DIAGNOSIS — R7309 Other abnormal glucose: Secondary | ICD-10-CM

## 2023-02-02 DIAGNOSIS — Z6841 Body Mass Index (BMI) 40.0 and over, adult: Secondary | ICD-10-CM

## 2023-02-02 NOTE — Progress Notes (Signed)
Pt presents for ROB. Pt complains of dizziness upon sitting to standing. Educated pt to move slowly when standing from sitting.

## 2023-02-02 NOTE — Progress Notes (Signed)
   PRENATAL VISIT NOTE  Subjective:  Diana Jacobs is a 33 y.o. [name] at [redacted]w[redacted]d being seen today for ongoing prenatal care.  She is currently monitored for the following issues for this low-risk pregnancy and has Supervision of other normal pregnancy, antepartum; Obesity during pregnancy; Abnormal glucose measurement; and Supervision of other high risk pregnancies, unspecified trimester on their problem list.  Patient reports no complaints.  Contractions: Not present. Vag. Bleeding: None.  Movement: Present. Denies leaking of fluid.   The following portions of the patient's history were reviewed and updated as appropriate: allergies, current medications, past family history, past medical history, past social history, past surgical history and problem list.   Objective:   Vitals:   02/02/23 0826  BP: 129/82  Pulse: 83  Weight: 276 lb 8 oz (125.4 kg)    Fetal Status: Fetal Heart Rate (bpm): 136   Movement: Present     General:  Alert, oriented and cooperative. Patient is in no acute distress.  Skin: Skin is warm and dry. No rash noted.   Cardiovascular: Normal heart rate noted  Respiratory: Normal respiratory effort, no problems with respiration noted  Abdomen: Soft, gravid, appropriate for gestational age.  Pain/Pressure: Absent      Assessment and Plan:  Pregnancy: [name] at [redacted]w[redacted]d 1. Supervision of other normal pregnancy, antepartum (Primary) 2. [redacted] weeks gestation of pregnancy EIF & marginal cord insertion on anatomy but LR NIPS. Follow up anatomy scheduled 2/24  3. Abnormal glucose measurement Elevated 1h, normal early 2h Routine screening at 28w  4. BMI 45.0-49.9, adult (HCC) ldASA  5. Marginal cord insertion LR NIPS, follow up anatomy scheduled 2/24  General obstetric precautions including but not limited to vaginal bleeding, contractions, leaking of fluid and fetal movement were reviewed in detail with the patient. Please refer to After Visit Summary for  other counseling recommendations.   Return in about 4 weeks (around 03/02/2023) for return OB at 25 weeks.  Future Appointments  Date Time Provider Department Center  02/27/2023  9:15 AM [facility] NURSE [facility]  02/27/2023  9:30 AM [facility] US6 [facility]  03/02/2023  8:55 AM Constant, Gigi Gin, MD [facility] None    Lennart Pall, MD

## 2023-02-06 ENCOUNTER — Encounter (HOSPITAL_COMMUNITY): Payer: Self-pay

## 2023-02-06 ENCOUNTER — Ambulatory Visit (HOSPITAL_COMMUNITY)
Admission: EM | Admit: 2023-02-06 | Discharge: 2023-02-06 | Disposition: A | Discharge status: Home / Self Care | Payer: Medicaid Other | Attending: Family Medicine | Admitting: Family Medicine

## 2023-02-06 DIAGNOSIS — Z20828 Contact with and (suspected) exposure to other viral communicable diseases: Secondary | ICD-10-CM

## 2023-02-06 DIAGNOSIS — J111 Influenza due to unidentified influenza virus with other respiratory manifestations: Secondary | ICD-10-CM | POA: Diagnosis not present

## 2023-02-06 MED ORDER — OSELTAMIVIR PHOSPHATE 75 MG PO CAPS
75.0000 mg | ORAL_CAPSULE | Freq: Two times a day (BID) | ORAL | 0 refills | Status: DC
Start: 2023-02-06 — End: 2023-02-27

## 2023-02-06 NOTE — Discharge Instructions (Signed)
Take oseltamivir 75 mg--1 capsule 2 times daily for 5 days   Tylenol as needed for aches or fever.  Make sure you are drinking enough fluids

## 2023-02-06 NOTE — ED Provider Notes (Signed)
[facility]    CSN: [id number] Arrival date & time: 02/06/23  1328      History   Chief Complaint Chief Complaint  Patient presents with   Headache   Generalized Body Aches   Nasal Congestion   Shortness of Breath   Cough    HPI Diana Jacobs is a 33 y.o. female.    Headache Associated symptoms: cough   Shortness of Breath Associated symptoms: cough and headaches   Cough Associated symptoms: headaches and shortness of breath   Here for cough and congestion and headache.  She has felt short of breath some.  No history of asthma.  She has not noted any fever.  She is having some myalgia  She is [redacted] weeks pregnant.  NKDA  No vomiting or diarrhea  There is a good bit of fetal movement.  Her son tested positive for the flu about 4 5 days ago.  Past Medical History:  Diagnosis Date   ASCUS with positive high risk HPV cervical pap smears    04/17/2013, 10/22/2013 and 06/25/2014. Benign colposcopy in 2015.   Herpes    Reports being told she had herpes, no lesions    Patient Active Problem List   Diagnosis Date Noted   Abnormal glucose measurement 01/05/2023   Supervision of other high risk pregnancies, unspecified trimester 01/05/2023   Obesity during pregnancy 12/08/2022   Supervision of other normal pregnancy, antepartum 11/02/2022    Past Surgical History:  Procedure Laterality Date   EYE SURGERY     EYE SURGERY     TOOTH EXTRACTION      OB History     Gravida  4   Para  1   Term  1   Preterm      AB  2   Living  1      SAB  1   IAB  1   Ectopic      Multiple      Live Births  1            Home Medications    Prior to Admission medications   Medication Sig Start Date End Date Taking? Authorizing Provider  aspirin 81 MG chewable tablet Chew 1 tablet (81 mg total) by mouth daily. 01/05/23   Allie Bossier, MD  Blood Pressure Monitoring (BLOOD PRESSURE KIT) DEVI 1 Device by Does not apply route once a  week. 11/02/22   Warden Fillers, MD  Prenatal Vit-Fe Fumarate-FA (PREPLUS) 27-1 MG TABS Take 1 tablet by mouth daily. 10/24/22   Hessie Dibble, MD  cetirizine (ZYRTEC ALLERGY) 10 MG tablet Take 1 tablet (10 mg total) by mouth daily. 08/21/19 09/21/19  Wallis Bamberg, PA-C  etonogestrel (NEXPLANON) 68 MG IMPL implant 1 each by Subdermal route once. Inserted 05/2016  01/14/19  [provider]    Family History Family History  Problem Relation Age of Onset   Diabetes Mother    Hypertension Mother    Diabetes Father     Social History Social History   Tobacco Use   Smoking status: Never   Smokeless tobacco: Never  Vaping Use   Vaping status: Never Used  Substance Use Topics   Alcohol use: Not Currently   Drug use: No     Allergies   Patient has no known allergies.   Review of Systems Review of Systems  Respiratory:  Positive for cough and shortness of breath.   Neurological:  Positive for headaches.  Physical Exam Triage Vital Signs ED Triage Vitals [02/06/23 1556]  Encounter Vitals Group     BP 118/78     Systolic BP Percentile      Diastolic BP Percentile      Pulse Rate 96     Resp 18     Temp 98.5 F (36.9 C)     Temp Source Oral     SpO2 98 %     Weight      Height      Head Circumference      Peak Flow      Pain Score 10     Pain Loc      Pain Education      Exclude from Growth Chart    No data found.  Updated Vital Signs BP 118/78 (BP Location: Left Arm)   Pulse 96   Temp 98.5 F (36.9 C) (Oral)   Resp 18   LMP 09/02/2022   SpO2 98%   Visual Acuity Right Eye Distance:   Left Eye Distance:   Bilateral Distance:    Right Eye Near:   Left Eye Near:    Bilateral Near:     Physical Exam Vitals reviewed.  Constitutional:      General: She is not in acute distress.    Appearance: She is not ill-appearing, toxic-appearing or diaphoretic.  HENT:     Right Ear: Tympanic membrane and ear canal normal.     Left Ear: Tympanic  membrane and ear canal normal.     Nose: Congestion present.     Mouth/Throat:     Mouth: Mucous membranes are moist.     Comments: There is some erythema the tonsillar pillars.  No asymmetry Eyes:     Extraocular Movements: Extraocular movements intact.     Conjunctiva/sclera: Conjunctivae normal.     Pupils: Pupils are equal, round, and reactive to light.  Cardiovascular:     Rate and Rhythm: Normal rate and regular rhythm.     Heart sounds: No murmur heard. Pulmonary:     Effort: Pulmonary effort is normal. No respiratory distress.     Breath sounds: Normal breath sounds. No stridor. No wheezing, rhonchi or rales.  Musculoskeletal:     Cervical back: Neck supple.  Lymphadenopathy:     Cervical: No cervical adenopathy.  Skin:    Capillary Refill: Capillary refill takes less than 2 seconds.     Coloration: Skin is not jaundiced or pale.  Neurological:     General: No focal deficit present.     Mental Status: She is alert and oriented to person, place, and time.  Psychiatric:        Behavior: Behavior normal.      UC Treatments / Results  Labs (all labs ordered are listed, but only abnormal results are displayed) Labs Reviewed - No data to display  EKG   Radiology No results found.  Procedures Procedures (including critical care time)  Medications Ordered in UC Medications - No data to display  Initial Impression / Assessment and Plan / UC Course  I have reviewed the triage vital signs and the nursing notes.  Pertinent labs & imaging results that were available during my care of the patient were reviewed by me and considered in my medical decision making (see chart for details).     We have no flu test kits in the building.  Tamiflu was sent in to treat for influenza-like illness and exposure to flu. Final Clinical Impressions(s) / UC  Diagnoses   Final diagnoses:  None   Discharge Instructions   None    ED Prescriptions   None    PDMP not  reviewed this encounter.   Zenia Resides, MD 02/06/23 [phone]

## 2023-02-06 NOTE — ED Triage Notes (Addendum)
Patient reports that she has been having a headache, body aches, nasal congestion, a non productive cough, fatigue, and SOB x 3 days.   Patient states she has been taking Tylenol and the last dose was at 0700 today.  Patient is [redacted] weeks  pregnant

## 2023-02-27 ENCOUNTER — Other Ambulatory Visit: Payer: Self-pay | Admitting: *Deleted

## 2023-02-27 ENCOUNTER — Ambulatory Visit: Payer: Medicaid Other | Attending: Obstetrics | Admitting: Obstetrics

## 2023-02-27 ENCOUNTER — Ambulatory Visit: Payer: Medicaid Other | Attending: Obstetrics and Gynecology

## 2023-02-27 ENCOUNTER — Ambulatory Visit: Payer: Medicaid Other | Admitting: *Deleted

## 2023-02-27 ENCOUNTER — Other Ambulatory Visit: Payer: Self-pay

## 2023-02-27 VITALS — BP 119/76 | HR 91

## 2023-02-27 DIAGNOSIS — O35BXX0 Maternal care for other (suspected) fetal abnormality and damage, fetal cardiac anomalies, not applicable or unspecified: Secondary | ICD-10-CM | POA: Diagnosis not present

## 2023-02-27 DIAGNOSIS — E669 Obesity, unspecified: Secondary | ICD-10-CM | POA: Diagnosis not present

## 2023-02-27 DIAGNOSIS — O99210 Obesity complicating pregnancy, unspecified trimester: Secondary | ICD-10-CM | POA: Diagnosis present

## 2023-02-27 DIAGNOSIS — O43192 Other malformation of placenta, second trimester: Secondary | ICD-10-CM | POA: Diagnosis not present

## 2023-02-27 DIAGNOSIS — Z3A24 24 weeks gestation of pregnancy: Secondary | ICD-10-CM | POA: Diagnosis not present

## 2023-02-27 DIAGNOSIS — O99212 Obesity complicating pregnancy, second trimester: Secondary | ICD-10-CM

## 2023-02-27 NOTE — Progress Notes (Signed)
 MFM Consult Note  Diana Jacobs is currently at 24 weeks and 4 days.  She has been followed due to maternal obesity with a BMI of 47.  The fetal cardiac views were unable to be fully visualized during her prior exam.    She denies any problems since her last exam.  She was informed that the fetal growth and amniotic fluid level appears appropriate for her gestational age.  The views of the fetal heart were visualized today.  There were no obvious fetal cardiac anomalies suspected.    The limitations of ultrasound in the detection of all anomalies was discussed.    Due to maternal obesity, we will continue to follow her with growth ultrasounds throughout her pregnancy.    Weekly fetal testing will be started at around 34 weeks.    She will return in 4 weeks for another ultrasound exam.    The patient stated that all of her questions were answered today.  A total of 20 minutes was spent counseling and coordinating the care for this patient.  Greater than 50% of the time was spent in direct face-to-face contact.

## 2023-03-02 ENCOUNTER — Encounter: Payer: Self-pay | Admitting: Obstetrics and Gynecology

## 2023-03-02 ENCOUNTER — Ambulatory Visit (INDEPENDENT_AMBULATORY_CARE_PROVIDER_SITE_OTHER): Payer: Medicaid Other | Admitting: Obstetrics and Gynecology

## 2023-03-02 VITALS — BP 122/78 | HR 91 | Wt 280.0 lb

## 2023-03-02 DIAGNOSIS — O99210 Obesity complicating pregnancy, unspecified trimester: Secondary | ICD-10-CM

## 2023-03-02 DIAGNOSIS — Z3480 Encounter for supervision of other normal pregnancy, unspecified trimester: Secondary | ICD-10-CM | POA: Diagnosis not present

## 2023-03-02 NOTE — Progress Notes (Addendum)
   PRENATAL VISIT NOTE  Subjective:  Diana Jacobs is a 33 y.o. [name] at [redacted]w[redacted]d being seen today for ongoing prenatal care.  She is currently monitored for the following issues for this high-risk pregnancy and has Supervision of other normal pregnancy, antepartum; Obesity during pregnancy; Abnormal glucose measurement; and Supervision of other high risk pregnancies, unspecified trimester on their problem list.  Patient reports no complaints.  Contractions: Not present. Vag. Bleeding: None.  Movement: Present. Denies leaking of fluid.   The following portions of the patient's history were reviewed and updated as appropriate: allergies, current medications, past family history, past medical history, past social history, past surgical history and problem list.   Objective:   Vitals:   03/02/23 0902  BP: 122/78  Pulse: 91  Weight: 280 lb (127 kg)    Fetal Status: Fetal Heart Rate (bpm): 141 Fundal Height: 27 cm Movement: Present     General:  Alert, oriented and cooperative. Patient is in no acute distress.  Skin: Skin is warm and dry. No rash noted.   Cardiovascular: Normal heart rate noted  Respiratory: Normal respiratory effort, no problems with respiration noted  Abdomen: Soft, gravid, appropriate for gestational age.  Pain/Pressure: Absent     Pelvic: Cervical exam deferred        Extremities: Normal range of motion.  Edema: None  Mental Status: Normal mood and affect. Normal behavior. Normal judgment and thought content.   Assessment and Plan:  Pregnancy: [name] at [redacted]w[redacted]d 1. Supervision of other normal pregnancy, antepartum (Primary) Patient is doing well without complaints Third trimester labs and glucola next visit Patient desires BTL- will sign consent form at next visit  2. Obesity during pregnancy Continue ASA Follow up growth ultrasound 03/27/23  Preterm labor symptoms and general obstetric precautions including but not limited to vaginal bleeding,  contractions, leaking of fluid and fetal movement were reviewed in detail with the patient. Please refer to After Visit Summary for other counseling recommendations.   Return in about 4 weeks (around 03/30/2023) for in person, ROB, 2 hr glucola next visit, Low risk.  Future Appointments  Date Time Provider Department Center  03/27/2023 11:30 AM [facility] US5 [facility]    Catalina Antigua, MD

## 2023-03-08 ENCOUNTER — Ambulatory Visit (HOSPITAL_COMMUNITY)
Admission: EM | Admit: 2023-03-08 | Discharge: 2023-03-08 | Disposition: A | Discharge status: Home / Self Care | Attending: Emergency Medicine | Admitting: Emergency Medicine

## 2023-03-08 ENCOUNTER — Encounter (HOSPITAL_COMMUNITY): Payer: Self-pay

## 2023-03-08 DIAGNOSIS — R519 Headache, unspecified: Secondary | ICD-10-CM | POA: Diagnosis not present

## 2023-03-08 HISTORY — DX: Encounter for supervision of normal pregnancy, unspecified, unspecified trimester: Z34.90

## 2023-03-08 MED ORDER — ACETAMINOPHEN 325 MG PO TABS
ORAL_TABLET | ORAL | Status: AC
  Filled 2023-03-08: qty 3

## 2023-03-08 MED ORDER — ACETAMINOPHEN 325 MG PO TABS
975.0000 mg | ORAL_TABLET | Freq: Once | ORAL | Status: AC
  Administered 2023-03-08: 975 mg via ORAL

## 2023-03-08 NOTE — ED Triage Notes (Signed)
 Pt states that she has a migraine. X2 days

## 2023-03-08 NOTE — ED Provider Notes (Signed)
 [facility]    CSN: [id number] Arrival date & time: 03/08/23  4098      History   Chief Complaint Chief Complaint  Patient presents with   Migraine    HPI Diana Jacobs is a 33 y.o. female.   Patient presents with persistent headache that began yesterday evening.  Denies blurred vision, nausea, vomiting, dizziness, numbness, tingling, and confusion.  Patient is almost [redacted] weeks pregnant.  Patient denies taking anything for headache because she was unsure what she is able to take while pregnant.   Migraine    Past Medical History:  Diagnosis Date   ASCUS with positive high risk HPV cervical pap smears    04/17/2013, 10/22/2013 and 06/25/2014. Benign colposcopy in 2015.   Herpes    Reports being told she had herpes, no lesions   Pregnancy     Patient Active Problem List   Diagnosis Date Noted   Abnormal glucose measurement 01/05/2023   Supervision of other high risk pregnancies, unspecified trimester 01/05/2023   Obesity during pregnancy 12/08/2022   Supervision of other normal pregnancy, antepartum 11/02/2022    Past Surgical History:  Procedure Laterality Date   EYE SURGERY     EYE SURGERY     TOOTH EXTRACTION      OB History     Gravida  4   Para  1   Term  1   Preterm      AB  2   Living  1      SAB  1   IAB  1   Ectopic      Multiple      Live Births  1            Home Medications    Prior to Admission medications   Medication Sig Start Date End Date Taking? Authorizing Provider  aspirin 81 MG chewable tablet Chew 1 tablet (81 mg total) by mouth daily. 01/05/23  Yes Dove, Myra C, MD  Blood Pressure Monitoring (BLOOD PRESSURE KIT) DEVI 1 Device by Does not apply route once a week. 11/02/22  Yes Warden Fillers, MD  Prenatal Vit-Fe Fumarate-FA (PREPLUS) 27-1 MG TABS Take 1 tablet by mouth daily. 10/24/22  Yes Hessie Dibble, MD  cetirizine (ZYRTEC ALLERGY) 10 MG tablet Take 1 tablet (10 mg total) by mouth  daily. 08/21/19 09/21/19  Wallis Bamberg, PA-C  etonogestrel (NEXPLANON) 68 MG IMPL implant 1 each by Subdermal route once. Inserted 05/2016  01/14/19  [provider]    Family History Family History  Problem Relation Age of Onset   Diabetes Mother    Hypertension Mother    Diabetes Father     Social History Social History   Tobacco Use   Smoking status: Never   Smokeless tobacco: Never  Vaping Use   Vaping status: Never Used  Substance Use Topics   Alcohol use: Not Currently   Drug use: No     Allergies   Patient has no known allergies.   Review of Systems Review of Systems  Per HPI  Physical Exam Triage Vital Signs ED Triage Vitals  Encounter Vitals Group     BP 03/08/23 1123 122/80     Systolic BP Percentile --      Diastolic BP Percentile --      Pulse Rate 03/08/23 1123 90     Resp 03/08/23 1123 17     Temp 03/08/23 1123 98.5 F (36.9 C)     Temp Source 03/08/23  1123 Oral     SpO2 03/08/23 1123 97 %     Weight 03/08/23 1122 280 lb (127 kg)     Height 03/08/23 1122 5\' 4"  (1.626 m)     Head Circumference --      Peak Flow --      Pain Score 03/08/23 1122 8     Pain Loc --      Pain Education --      Exclude from Growth Chart --    No data found.  Updated Vital Signs BP 122/80 (BP Location: Right Arm)   Pulse 90   Temp 98.5 F (36.9 C) (Oral)   Resp 17   Ht 5\' 4"  (1.626 m)   Wt 280 lb (127 kg)   LMP 09/02/2022   SpO2 97%   BMI 48.06 kg/m   Visual Acuity Right Eye Distance:   Left Eye Distance:   Bilateral Distance:    Right Eye Near:   Left Eye Near:    Bilateral Near:     Physical Exam Vitals and nursing note reviewed.  Constitutional:      General: She is awake. She is not in acute distress.    Appearance: Normal appearance. She is well-developed and well-groomed. She is not ill-appearing.  HENT:     Head: Normocephalic.     Right Ear: Tympanic membrane, ear canal and external ear normal.     Left Ear: Tympanic membrane,  ear canal and external ear normal.     Nose: Nose normal.     Mouth/Throat:     Mouth: Mucous membranes are moist.     Pharynx: Oropharynx is clear.  Eyes:     Extraocular Movements: Extraocular movements intact.     Conjunctiva/sclera: Conjunctivae normal.     Pupils: Pupils are equal, round, and reactive to light.  Musculoskeletal:        General: Normal range of motion.     Cervical back: Normal range of motion and neck supple.  Skin:    General: Skin is warm and dry.  Neurological:     General: No focal deficit present.     Mental Status: She is alert and oriented to person, place, and time. Mental status is at baseline.     GCS: GCS eye subscore is 4. GCS verbal subscore is 5. GCS motor subscore is 6.  Psychiatric:        Behavior: Behavior is cooperative.      UC Treatments / Results  Labs (all labs ordered are listed, but only abnormal results are displayed) Labs Reviewed - No data to display  EKG   Radiology No results found.  Procedures Procedures (including critical care time)  Medications Ordered in UC Medications  acetaminophen (TYLENOL) tablet 975 mg (975 mg Oral Given 03/08/23 1219)    Initial Impression / Assessment and Plan / UC Course  I have reviewed the triage vital signs and the nursing notes.  Pertinent labs & imaging results that were available during my care of the patient were reviewed by me and considered in my medical decision making (see chart for details).     Patient presented with persistent headache that began yesterday evening.  Denies any other symptoms.  Patient is [redacted] weeks pregnant and states that she was unsure where she is able to take due to being pregnant.  No significant findings upon assessment.  GCS 15.  EOMI and PERRLA.  Patient's blood pressure is normal at 122/80.  No findings on assessment that  are consistent with possible preeclampsia.  Given Tylenol in clinic.  Discussed that Tylenol is the safest choice that she can  take for headache.  Discussed importance of hydration.  Discussed follow-up, return, and strict ER precautions. Final Clinical Impressions(s) / UC Diagnoses   Final diagnoses:  Bad headache     Discharge Instructions      We have given you Tylenol here today to help with your headache. Due to being pregnant the safest medication you can take is Tylenol to help with your headache. You can take Tylenol every 4-6 hours as needed for headache.   Otherwise make sure you are staying hydrated and get some rest.  Return here as needed.  If your headache persists, worsens, or you develop any new symptoms such as blurred vision, dizziness, excessive vomiting, or feeling like you might pass out please seek immediate medical treatment at the maternal admissions unit or emergency department.      ED Prescriptions   None    PDMP not reviewed this encounter.   Wynonia Lawman A, NP 03/08/23 1224

## 2023-03-08 NOTE — Discharge Instructions (Signed)
 We have given you Tylenol here today to help with your headache. Due to being pregnant the safest medication you can take is Tylenol to help with your headache. You can take Tylenol every 4-6 hours as needed for headache.   Otherwise make sure you are staying hydrated and get some rest.  Return here as needed.  If your headache persists, worsens, or you develop any new symptoms such as blurred vision, dizziness, excessive vomiting, or feeling like you might pass out please seek immediate medical treatment at the maternal admissions unit or emergency department.

## 2023-03-27 ENCOUNTER — Ambulatory Visit: Payer: Medicaid Other | Attending: Obstetrics

## 2023-03-27 ENCOUNTER — Other Ambulatory Visit: Payer: Self-pay | Admitting: *Deleted

## 2023-03-27 DIAGNOSIS — O43193 Other malformation of placenta, third trimester: Secondary | ICD-10-CM | POA: Diagnosis not present

## 2023-03-27 DIAGNOSIS — O99212 Obesity complicating pregnancy, second trimester: Secondary | ICD-10-CM | POA: Diagnosis present

## 2023-03-27 DIAGNOSIS — O35BXX0 Maternal care for other (suspected) fetal abnormality and damage, fetal cardiac anomalies, not applicable or unspecified: Secondary | ICD-10-CM

## 2023-03-27 DIAGNOSIS — O99210 Obesity complicating pregnancy, unspecified trimester: Secondary | ICD-10-CM

## 2023-03-27 DIAGNOSIS — E669 Obesity, unspecified: Secondary | ICD-10-CM

## 2023-03-27 DIAGNOSIS — O43199 Other malformation of placenta, unspecified trimester: Secondary | ICD-10-CM

## 2023-03-27 DIAGNOSIS — Z3A28 28 weeks gestation of pregnancy: Secondary | ICD-10-CM

## 2023-03-30 ENCOUNTER — Ambulatory Visit: Payer: Medicaid Other | Admitting: Obstetrics and Gynecology

## 2023-03-30 ENCOUNTER — Other Ambulatory Visit: Payer: Medicaid Other

## 2023-03-30 VITALS — BP 127/80 | HR 92 | Wt 285.4 lb

## 2023-03-30 DIAGNOSIS — Z3A29 29 weeks gestation of pregnancy: Secondary | ICD-10-CM | POA: Diagnosis not present

## 2023-03-30 DIAGNOSIS — Z6841 Body Mass Index (BMI) 40.0 and over, adult: Secondary | ICD-10-CM | POA: Diagnosis not present

## 2023-03-30 DIAGNOSIS — Z23 Encounter for immunization: Secondary | ICD-10-CM

## 2023-03-30 DIAGNOSIS — Z8619 Personal history of other infectious and parasitic diseases: Secondary | ICD-10-CM | POA: Diagnosis not present

## 2023-03-30 DIAGNOSIS — Z3480 Encounter for supervision of other normal pregnancy, unspecified trimester: Secondary | ICD-10-CM

## 2023-03-30 MED ORDER — HYDROXYZINE HCL 25 MG PO TABS
25.0000 mg | ORAL_TABLET | Freq: Four times a day (QID) | ORAL | 2 refills | Status: DC | PRN
Start: 2023-03-30 — End: 2023-06-04

## 2023-03-30 NOTE — Progress Notes (Signed)
 Itching all over body. Scratching but using lotion.

## 2023-03-30 NOTE — Progress Notes (Signed)
 gtt

## 2023-03-30 NOTE — Progress Notes (Signed)
   PRENATAL VISIT NOTE  Subjective:  Diana Jacobs is a 33 y.o. [name] at [redacted]w[redacted]d being seen today for ongoing prenatal care.  She is currently monitored for the following issues for this high-risk pregnancy and has Supervision of other normal pregnancy, antepartum; Obesity during pregnancy; Abnormal glucose measurement; and Supervision of other high risk pregnancies, unspecified trimester on their problem list.  Patient reports  reports itching  .  Contractions: Not present. Vag. Bleeding: None.  Movement: Present. Denies leaking of fluid.   The following portions of the patient's history were reviewed and updated as appropriate: allergies, current medications, past family history, past medical history, past social history, past surgical history and problem list.   Objective:   Vitals:   03/30/23 0930 03/30/23 0949  BP: (!) 145/82 127/80  Pulse: 92   Weight: 285 lb 6.4 oz (129.5 kg)     Fetal Status: Fetal Heart Rate (bpm): 134   Movement: Present     General:  Alert, oriented and cooperative. Patient is in no acute distress.  Skin: Skin is warm and dry. No rash noted.   Cardiovascular: Normal heart rate noted  Respiratory: Normal respiratory effort, no problems with respiration noted  Abdomen: Soft, gravid, appropriate for gestational age.  Pain/Pressure: Absent     Pelvic: Cervical exam deferred        Extremities: Normal range of motion.  Edema: None  Mental Status: Normal mood and affect. Normal behavior. Normal judgment and thought content.   Assessment and Plan:  Pregnancy: [name] at [redacted]w[redacted]d 1. Supervision of other normal pregnancy, antepartum (Primary) BP and FHR normal Doing well, feeling regular movement  Sent rx for itching, been present entire pregnancy   2. [redacted] weeks gestation of pregnancy Discussed BTL last visit, signing consent today  - Glucose Tolerance, 2 Hours w/1 Hour - HIV antibody (with reflex) - RPR - CBC  3. BMI 45.0-49.9, adult  (HCC) Continue ASA  3/24 u/s efw 27% AC 56%, afi normal  Weekly testing at 34 weeks,   4. History of herpes simplex infection Dx 2013, no outbreak, discussed suppression 34-36 weeks   Preterm labor symptoms and general obstetric precautions including but not limited to vaginal bleeding, contractions, leaking of fluid and fetal movement were reviewed in detail with the patient. Please refer to After Visit Summary for other counseling recommendations.   Return in about 2 weeks (around 04/13/2023) for OB VISIT (MD or APP).  Future Appointments  Date Time Provider Department Center  05/01/2023  8:00 AM [facility] PROVIDER 1 [facility]  05/01/2023  8:30 AM [facility] US2 [facility]  05/08/2023  8:00 AM [facility] PROVIDER 1 [facility]  05/08/2023  8:30 AM [facility] US4 [facility]  05/15/2023  9:00 AM [facility] PROVIDER 1 [facility]  05/15/2023  9:30 AM [facility] US5 [facility]  05/22/2023  8:00 AM [facility] PROVIDER 1 [facility]  05/22/2023  8:30 AM [facility] US4 [facility]    Albertine Grates, FNP

## 2023-03-31 LAB — CBC
Hematocrit: 34.7 % (ref 34.0–46.6)
Hemoglobin: 10.9 g/dL — ABNORMAL LOW (ref 11.1–15.9)
MCH: 26.7 pg (ref 26.6–33.0)
MCHC: 31.4 g/dL — ABNORMAL LOW (ref 31.5–35.7)
MCV: 85 fL (ref 79–97)
Platelets: 309 10*3/uL (ref 150–450)
RBC: 4.08 x10E6/uL (ref 3.77–5.28)
RDW: 15.1 % (ref 11.7–15.4)
WBC: 5.8 10*3/uL (ref 3.4–10.8)

## 2023-03-31 LAB — GLUCOSE TOLERANCE, 2 HOURS W/ 1HR
Glucose, 1 hour: 150 mg/dL (ref 70–179)
Glucose, 2 hour: 154 mg/dL — ABNORMAL HIGH (ref 70–152)
Glucose, Fasting: 87 mg/dL (ref 70–91)

## 2023-03-31 LAB — HIV ANTIBODY (ROUTINE TESTING W REFLEX): HIV Screen 4th Generation wRfx: NONREACTIVE

## 2023-03-31 LAB — RPR: RPR Ser Ql: NONREACTIVE

## 2023-04-01 ENCOUNTER — Encounter: Payer: Self-pay | Admitting: Obstetrics and Gynecology

## 2023-04-01 DIAGNOSIS — O24419 Gestational diabetes mellitus in pregnancy, unspecified control: Secondary | ICD-10-CM | POA: Insufficient documentation

## 2023-04-03 ENCOUNTER — Other Ambulatory Visit: Payer: Self-pay

## 2023-04-03 DIAGNOSIS — O24419 Gestational diabetes mellitus in pregnancy, unspecified control: Secondary | ICD-10-CM

## 2023-04-03 MED ORDER — ACCU-CHEK SOFTCLIX LANCETS MISC: 12 refills | Status: DC

## 2023-04-03 MED ORDER — ACCU-CHEK GUIDE TEST VI STRP: ORAL_STRIP | 12 refills | Status: DC

## 2023-04-03 MED ORDER — ACCU-CHEK GUIDE W/DEVICE KIT: 1.0000 | PACK | Freq: Four times a day (QID) | 0 refills | Status: DC

## 2023-04-03 NOTE — Progress Notes (Signed)
 Diabetic supplies sent in, and referral placed for GDM

## 2023-04-14 ENCOUNTER — Ambulatory Visit (INDEPENDENT_AMBULATORY_CARE_PROVIDER_SITE_OTHER): Admitting: Obstetrics and Gynecology

## 2023-04-14 VITALS — BP 118/78 | HR 84 | Wt 286.3 lb

## 2023-04-14 DIAGNOSIS — L299 Pruritus, unspecified: Secondary | ICD-10-CM

## 2023-04-14 DIAGNOSIS — Z3480 Encounter for supervision of other normal pregnancy, unspecified trimester: Secondary | ICD-10-CM

## 2023-04-14 DIAGNOSIS — Z8619 Personal history of other infectious and parasitic diseases: Secondary | ICD-10-CM | POA: Diagnosis not present

## 2023-04-14 DIAGNOSIS — O24419 Gestational diabetes mellitus in pregnancy, unspecified control: Secondary | ICD-10-CM

## 2023-04-14 DIAGNOSIS — Z3A31 31 weeks gestation of pregnancy: Secondary | ICD-10-CM

## 2023-04-14 DIAGNOSIS — O99210 Obesity complicating pregnancy, unspecified trimester: Secondary | ICD-10-CM

## 2023-04-14 MED ORDER — HYDROCORTISONE 1 % EX LOTN: 1.0000 | TOPICAL_LOTION | Freq: Two times a day (BID) | CUTANEOUS | 0 refills | Status: DC

## 2023-04-14 NOTE — Progress Notes (Signed)
 Pt presents for rob. Pt states that she is still itching and may need cream instead of the pills.

## 2023-04-14 NOTE — Progress Notes (Signed)
   PRENATAL VISIT NOTE  Subjective:  Diana Jacobs is a 33 y.o. [id number] at [redacted]w[redacted]d being seen today for ongoing prenatal care.  She is currently monitored for the following issues for this high-risk pregnancy and has Supervision of other normal pregnancy, antepartum; Obesity during pregnancy; Abnormal glucose measurement; Supervision of other high risk pregnancies, unspecified trimester; and Gestational diabetes mellitus (GDM) affecting pregnancy on their problem list.  Patient reports itching sense beginning of pregnancy, mostly abdomen and back, does reports feet and hands. Reports throughout day not at specific time.  Contractions: Not present. Vag. Bleeding: None.  Movement: Present. Denies leaking of fluid.   The following portions of the patient's history were reviewed and updated as appropriate: allergies, current medications, past family history, past medical history, past social history, past surgical history and problem list.   Objective:   Vitals:   04/14/23 0902  BP: 118/78  Pulse: 84  Weight: 286 lb 4.8 oz (129.9 kg)    Fetal Status: Fetal Heart Rate (bpm): 143   Movement: Present     General:  Alert, oriented and cooperative. Patient is in no acute distress.  Skin: Skin is warm and dry. No rash noted.   Cardiovascular: Normal heart rate noted  Respiratory: Normal respiratory effort, no problems with respiration noted  Abdomen: Soft, gravid, appropriate for gestational age.  Pain/Pressure: Absent     Pelvic: Cervical exam deferred        Extremities: Normal range of motion.  Edema: None  Mental Status: Normal mood and affect. Normal behavior. Normal judgment and thought content.   Assessment and Plan:  Pregnancy: [name] at [redacted]w[redacted]d 1. Supervision of other normal pregnancy, antepartum (Primary) BP and FHR normal Doing well, feeling regular movement    2. Gestational diabetes mellitus (GDM) in third trimester, gestational diabetes method of control  unspecified Received supplies, starting checking but was unsure when she was supposed to be checking. Discussed checking 4x day, fasting, and 2hrs after each meal. Discussed dietary changes and incorporating exercise. Has diabetic education 4/16  3. [redacted] weeks gestation of pregnancy   4. History of herpes simplex infection Suppression 34-36 wks  5. Obesity during pregnancy Continue ASA weekly testing starting at 34 weeks  6. Pruritus Has had sense beginning of pregnancy, no rash present   Discussed checking labs today to rule out ICP - Comp Met (CMET) - Bile acids, total    Preterm labor symptoms and general obstetric precautions including but not limited to vaginal bleeding, contractions, leaking of fluid and fetal movement were reviewed in detail with the patient. Please refer to After Visit Summary for other counseling recommendations.   Return in about 2 weeks (around 04/28/2023) for OB VISIT (MD or APP).  Future Appointments  Date Time Provider Department Center  04/19/2023  9:00 AM [facility]DM CLASS [facility]  04/28/2023  9:55 AM Gabrielle Joiner, MD [facility] None  05/01/2023  8:00 AM [facility] PROVIDER 1 [facility]  05/01/2023  8:30 AM [facility] US2 [facility]  05/08/2023  8:00 AM [facility] PROVIDER 1 [facility]  05/08/2023  8:30 AM [facility] US4 [facility]  05/15/2023  9:00 AM [facility] PROVIDER 1 [facility]  05/15/2023  9:30 AM [facility] US5 [facility]  05/22/2023  8:00 AM [facility] PROVIDER 1 [facility]  05/22/2023  8:30 AM [facility] US4 [facility]    Susi Eric, FNP

## 2023-04-17 LAB — COMPREHENSIVE METABOLIC PANEL WITH GFR
ALT: 15 IU/L (ref 0–32)
AST: 18 IU/L (ref 0–40)
Albumin: 3.9 g/dL (ref 3.9–4.9)
Alkaline Phosphatase: 120 IU/L (ref 44–121)
BUN/Creatinine Ratio: 8 — ABNORMAL LOW (ref 9–23)
BUN: 6 mg/dL (ref 6–20)
Bilirubin Total: 0.3 mg/dL (ref 0.0–1.2)
CO2: 20 mmol/L (ref 20–29)
Calcium: 9.1 mg/dL (ref 8.7–10.2)
Chloride: 103 mmol/L (ref 96–106)
Creatinine, Ser: 0.77 mg/dL (ref 0.57–1.00)
Globulin, Total: 3.3 g/dL (ref 1.5–4.5)
Glucose: 83 mg/dL (ref 70–99)
Potassium: 4.5 mmol/L (ref 3.5–5.2)
Sodium: 140 mmol/L (ref 134–144)
Total Protein: 7.2 g/dL (ref 6.0–8.5)
eGFR: 104 mL/min/{1.73_m2} (ref >59)

## 2023-04-17 LAB — BILE ACIDS, TOTAL: Bile Acids Total: 1.8 umol/L (ref 0.0–10.0)

## 2023-04-19 ENCOUNTER — Encounter: Attending: Obstetrics and Gynecology | Admitting: Dietician

## 2023-04-19 DIAGNOSIS — O24419 Gestational diabetes mellitus in pregnancy, unspecified control: Secondary | ICD-10-CM | POA: Diagnosis present

## 2023-04-19 NOTE — Progress Notes (Signed)
 Patient was seen on 04/19/2023 for Gestational Diabetes self-management class at the [facility]. The following learning objectives were met by the patient during this course:  States the definition of Gestational Diabetes States why dietary management is important in controlling blood glucose Describes the effects each nutrient has on blood glucose levels Demonstrates ability to create a balanced meal plan Demonstrates carbohydrate counting  States when to check blood glucose levels Demonstrates proper blood glucose monitoring techniques States the effect of stress and exercise on blood glucose levels States the importance of limiting caffeine and abstaining from alcohol and smoking  Patient has a meter prior to visit. Patient is instructed to continue testing pre breakfast and 2 hours after each meal. Blood glucose today in class 115 mg/dL, reported as ~ 2 hour post prandial per Pt.  Patient instructed to monitor glucose levels: QID FBS: 60 - <90 1 hour: <140 2 hour: <120  *Patient received handouts: Nutrition Diabetes and Pregnancy Carbohydrate Counting List Blood glucose log Snack ideas for diabetes during pregnancy  Patient will be seen for follow-up as needed.

## 2023-04-28 ENCOUNTER — Encounter: Admitting: Obstetrics

## 2023-04-28 ENCOUNTER — Encounter: Payer: Self-pay | Admitting: Obstetrics

## 2023-04-28 ENCOUNTER — Ambulatory Visit (INDEPENDENT_AMBULATORY_CARE_PROVIDER_SITE_OTHER): Admitting: Obstetrics

## 2023-04-28 VITALS — BP 123/64 | HR 96 | Wt 289.5 lb

## 2023-04-28 DIAGNOSIS — O43192 Other malformation of placenta, second trimester: Secondary | ICD-10-CM

## 2023-04-28 DIAGNOSIS — Z3480 Encounter for supervision of other normal pregnancy, unspecified trimester: Secondary | ICD-10-CM | POA: Diagnosis not present

## 2023-04-28 DIAGNOSIS — Z8619 Personal history of other infectious and parasitic diseases: Secondary | ICD-10-CM

## 2023-04-28 DIAGNOSIS — O99210 Obesity complicating pregnancy, unspecified trimester: Secondary | ICD-10-CM

## 2023-04-28 DIAGNOSIS — O24419 Gestational diabetes mellitus in pregnancy, unspecified control: Secondary | ICD-10-CM

## 2023-04-28 MED ORDER — VALACYCLOVIR HCL 1 G PO TABS: 1000.0000 mg | ORAL_TABLET | Freq: Every day | ORAL | 11 refills | Status: DC

## 2023-04-28 NOTE — Progress Notes (Addendum)
 Subjective:  Diana Jacobs is a 33 y.o. [name] at [redacted]w[redacted]d being seen today for ongoing prenatal care.  She is currently monitored for the following issues for this high-risk pregnancy and has Supervision of other normal pregnancy, antepartum; Obesity during pregnancy; Abnormal glucose measurement; Supervision of other high risk pregnancies, unspecified trimester; and Gestational diabetes mellitus (GDM) affecting pregnancy on their problem list.  Patient reports no complaints.  Contractions: Not present. Vag. Bleeding: None.  Movement: Present. Denies leaking of fluid.   The following portions of the patient's history were reviewed and updated as appropriate: allergies, current medications, past family history, past medical history, past social history, past surgical history and problem list. Problem list updated.  Objective:   Vitals:   04/28/23 0821  BP: 123/64  Pulse: 96  Weight: 289 lb 8 oz (131.3 kg)    Fetal Status: Fetal Heart Rate (bpm): 138   Movement: Present     General:  Alert, oriented and cooperative. Patient is in no acute distress.  Skin: Skin is warm and dry. No rash noted.   Cardiovascular: Normal heart rate noted  Respiratory: Normal respiratory effort, no problems with respiration noted  Abdomen: Soft, gravid, appropriate for gestational age. Pain/Pressure: Present     Pelvic:  Cervical exam deferred        Extremities: Normal range of motion.  Edema: Trace  Mental Status: Normal mood and affect. Normal behavior. Normal judgment and thought content.   Urinalysis:      Assessment and Plan:  Pregnancy: [name] at [redacted]w[redacted]d  1. Supervision of other normal pregnancy, antepartum (Primary)  2. Gestational diabetes mellitus (GDM) in third trimester, gestational diabetes method of control unspecified - good glucose control:  FBS = 90-95 and 2 hour PP = 120-130  3. 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 Marginal  insertion of umbilical cord affecting management of mother in second trimester  5. Obesity during pregnancy   Preterm labor symptoms and general obstetric precautions including but not limited to vaginal bleeding, contractions, leaking of fluid and fetal movement were reviewed in detail with the patient. Please refer to After Visit Summary for other counseling recommendations.   Return in about 2 weeks (around 05/12/2023) for [facility].   Gabrielle Joiner, MD 04/28/2023

## 2023-05-01 ENCOUNTER — Ambulatory Visit (HOSPITAL_BASED_OUTPATIENT_CLINIC_OR_DEPARTMENT_OTHER): Admitting: Maternal & Fetal Medicine

## 2023-05-01 ENCOUNTER — Ambulatory Visit: Attending: Obstetrics and Gynecology

## 2023-05-01 VITALS — BP 133/74 | HR 94

## 2023-05-01 DIAGNOSIS — O99213 Obesity complicating pregnancy, third trimester: Secondary | ICD-10-CM | POA: Diagnosis not present

## 2023-05-01 DIAGNOSIS — O09893 Supervision of other high risk pregnancies, third trimester: Secondary | ICD-10-CM

## 2023-05-01 DIAGNOSIS — O09899 Supervision of other high risk pregnancies, unspecified trimester: Secondary | ICD-10-CM | POA: Insufficient documentation

## 2023-05-01 DIAGNOSIS — O35BXX0 Maternal care for other (suspected) fetal abnormality and damage, fetal cardiac anomalies, not applicable or unspecified: Secondary | ICD-10-CM | POA: Diagnosis not present

## 2023-05-01 DIAGNOSIS — O43199 Other malformation of placenta, unspecified trimester: Secondary | ICD-10-CM | POA: Diagnosis not present

## 2023-05-01 DIAGNOSIS — O43193 Other malformation of placenta, third trimester: Secondary | ICD-10-CM | POA: Diagnosis not present

## 2023-05-01 DIAGNOSIS — Z3A33 33 weeks gestation of pregnancy: Secondary | ICD-10-CM | POA: Insufficient documentation

## 2023-05-01 DIAGNOSIS — O24410 Gestational diabetes mellitus in pregnancy, diet controlled: Secondary | ICD-10-CM

## 2023-05-01 DIAGNOSIS — O99210 Obesity complicating pregnancy, unspecified trimester: Secondary | ICD-10-CM

## 2023-05-01 DIAGNOSIS — E669 Obesity, unspecified: Secondary | ICD-10-CM

## 2023-05-01 NOTE — Progress Notes (Signed)
   Patient information  Patient Name: MARIETTA BUSHNELL [facility]  Patient MRN:   [id number]  Referring practice: MFM Referring Provider: [facility]  MFM CONSULT  Ece ONALEE STEINMEYER is a 33 y.o. [id number] at [redacted]w[redacted]d here for ultrasound and consultation. Patient Active Problem List   Diagnosis Date Noted   Marginal insertion of umbilical cord affecting management of mother 05/01/2023   Diet controlled gestational diabetes mellitus in third trimester 01/05/2023   Obesity during pregnancy 12/08/2022   Supervision of other high risk pregnancies, unspecified trimester 11/02/2022   Keauna GAVAUGHN Hogsett is doing well today with no acute concerns.  The patient reports that she is doing well today and rest her blood sugars are at goal.  We discussed the importance of follow-up ultrasound to assess the fetal BPP due to maternal obesity, marginal cord insertion and gestational diabetes.  We discussed the timing of delivery will likely be around 39 weeks or sooner if indicated.  She will continue to assess her blood glucose levels and bring her log to her OB provider.  Sonographic findings Single intrauterine pregnancy. Fetal cardiac activity: Observed. Presentation: Cephalic. Interval fetal anatomy appears normal. Fetal biometry shows the estimated fetal weight at the 56 percentile. Amniotic fluid: Within normal limits.  MVP: 5.3 cm. Placenta: Anterior. BPP: 8/8.   There are limitations of prenatal ultrasound such as the inability to detect certain abnormalities due to poor visualization. Various factors such as fetal position, gestational age and maternal body habitus may increase the difficulty in visualizing the fetal anatomy.    Recommendations -Weekly antenatal testing until delivery -Growth ultrasounds every 4 weeks until delivery  -Delivery around 39-39.[redacted] weeks gestation    Review of Systems: A review of systems was performed and was negative except per HPI    Vitals and Physical Exam    05/01/2023    8:18 AM 04/28/2023    8:21 AM 04/14/2023    9:02 AM  Vitals with BMI  Weight  289 lbs 8 oz 286 lbs 5 oz  Systolic 133 123 696  Diastolic 74 64 78  Pulse 94 96 84    Sitting comfortably on the sonogram table Nonlabored breathing Normal rate and rhythm Abdomen is nontender  Past pregnancies OB History  Gravida Para Term Preterm AB Living  4 1 1  2 1   SAB IAB Ectopic Multiple Live Births  1 1   1     # Outcome Date GA Lbr Len/2nd Weight Sex Type Anes PTL Lv  4 Current           3 Term 02/28/13 [redacted]w[redacted]d / 01:22 8 lb 4.8 oz (3.765 kg) M Vag-Spont EPI  LIV  2 IAB 2015          1 SAB 02/02/11             Birth Comments: 7wks     I spent 20 minutes reviewing the patients chart, including labs and images as well as counseling the patient about her medical conditions. Greater than 50% of the time was spent in direct face-to-face patient counseling.  Penney Bowling  MFM, [facility]   05/01/2023  9:19 AM

## 2023-05-08 ENCOUNTER — Ambulatory Visit: Attending: Obstetrics and Gynecology

## 2023-05-08 ENCOUNTER — Ambulatory Visit (HOSPITAL_BASED_OUTPATIENT_CLINIC_OR_DEPARTMENT_OTHER): Admitting: Obstetrics

## 2023-05-08 ENCOUNTER — Other Ambulatory Visit: Payer: Self-pay | Admitting: *Deleted

## 2023-05-08 VITALS — BP 134/82 | HR 100

## 2023-05-08 DIAGNOSIS — O24410 Gestational diabetes mellitus in pregnancy, diet controlled: Secondary | ICD-10-CM

## 2023-05-08 DIAGNOSIS — E669 Obesity, unspecified: Secondary | ICD-10-CM

## 2023-05-08 DIAGNOSIS — O43199 Other malformation of placenta, unspecified trimester: Secondary | ICD-10-CM

## 2023-05-08 DIAGNOSIS — O99210 Obesity complicating pregnancy, unspecified trimester: Secondary | ICD-10-CM

## 2023-05-08 DIAGNOSIS — O99213 Obesity complicating pregnancy, third trimester: Secondary | ICD-10-CM | POA: Insufficient documentation

## 2023-05-08 DIAGNOSIS — O09899 Supervision of other high risk pregnancies, unspecified trimester: Secondary | ICD-10-CM

## 2023-05-08 DIAGNOSIS — O35BXX0 Maternal care for other (suspected) fetal abnormality and damage, fetal cardiac anomalies, not applicable or unspecified: Secondary | ICD-10-CM

## 2023-05-08 DIAGNOSIS — Z3A34 34 weeks gestation of pregnancy: Secondary | ICD-10-CM | POA: Diagnosis present

## 2023-05-08 DIAGNOSIS — O43193 Other malformation of placenta, third trimester: Secondary | ICD-10-CM

## 2023-05-08 NOTE — Progress Notes (Signed)
 MFM Consult Note  Diana Jacobs is currently at 34 weeks and 4 days.  She has been followed due to maternal obesity with a BMI of 47 and diet-controlled gestational diabetes.    She denies any problems since her last exam reports that her fingerstick values have mostly been within normal limits.  A biophysical profile performed today was 8/8.  There was normal amniotic fluid noted with a total AFI of 14.91 cm.    Due to maternal obesity and gestational diabetes, we will continue to follow her with weekly fetal testing until delivery.    Delivery should occur at around 39 weeks.  She will return in 1 week for another BPP.  The patient stated that all of her questions were answered today.  A total of 20 minutes was spent counseling and coordinating the care for this patient.  Greater than 50% of the time was spent in direct face-to-face contact.

## 2023-05-12 ENCOUNTER — Encounter: Payer: Self-pay | Admitting: Obstetrics

## 2023-05-12 ENCOUNTER — Ambulatory Visit (INDEPENDENT_AMBULATORY_CARE_PROVIDER_SITE_OTHER): Admitting: Obstetrics

## 2023-05-12 VITALS — BP 133/92 | HR 96 | Wt 294.2 lb

## 2023-05-12 DIAGNOSIS — O43193 Other malformation of placenta, third trimester: Secondary | ICD-10-CM | POA: Diagnosis not present

## 2023-05-12 DIAGNOSIS — Z8619 Personal history of other infectious and parasitic diseases: Secondary | ICD-10-CM | POA: Diagnosis not present

## 2023-05-12 DIAGNOSIS — O24419 Gestational diabetes mellitus in pregnancy, unspecified control: Secondary | ICD-10-CM

## 2023-05-12 DIAGNOSIS — O0990 Supervision of high risk pregnancy, unspecified, unspecified trimester: Secondary | ICD-10-CM

## 2023-05-12 DIAGNOSIS — O99210 Obesity complicating pregnancy, unspecified trimester: Secondary | ICD-10-CM

## 2023-05-12 NOTE — Progress Notes (Signed)
 Pt presents for HOB. Pt has no questions or concerns at this time.

## 2023-05-12 NOTE — Progress Notes (Signed)
 Subjective:  Diana Jacobs is a 33 y.o. [name] at [redacted]w[redacted]d being seen today for ongoing prenatal care.  She is currently monitored for the following issues for this high-risk pregnancy and has Obesity during pregnancy; Supervision of other high risk pregnancies, unspecified trimester; Diet controlled gestational diabetes mellitus in third trimester; and Marginal insertion of umbilical cord affecting management of mother on their problem list.  Patient reports no complaints.  Contractions: Irritability. Vag. Bleeding: None.  Movement: Present. Denies leaking of fluid.   The following portions of the patient's history were reviewed and updated as appropriate: allergies, current medications, past family history, past medical history, past social history, past surgical history and problem list. Problem list updated.  Objective:   Vitals:   05/12/23 0826 05/12/23 0832  BP: (!) 154/93 (!) 133/92  Pulse: 95 96  Weight: 294 lb 3.2 oz (133.4 kg)     Fetal Status: Fetal Heart Rate (bpm): 154   Movement: Present     General:  Alert, oriented and cooperative. Patient is in no acute distress.  Skin: Skin is warm and dry. No rash noted.   Cardiovascular: Normal heart rate noted  Respiratory: Normal respiratory effort, no problems with respiration noted  Abdomen: Soft, gravid, appropriate for gestational age. Pain/Pressure: Present     Pelvic:  Cervical exam deferred        Extremities: Normal range of motion.  Edema: Trace (Feet)  Mental Status: Normal mood and affect. Normal behavior. Normal judgment and thought content.   Urinalysis:      Assessment and Plan:  Pregnancy: [name] at [redacted]w[redacted]d  1. Supervision of high risk pregnancy, antepartum (Primary)  2. Gestational diabetes mellitus (GDM) in third trimester, gestational diabetes method of control unspecified - fair glucose control:  FBS = 80-95 ;  2 hour PP = [phone]  3. History of herpes simplex infection - taking daily Valtrex   po for suppression per protocol  4. Marginal insertion of umbilical cord affecting management of mother in third trimester  5. Obesity during pregnancy   Preterm labor symptoms and general obstetric precautions including but not limited to vaginal bleeding, contractions, leaking of fluid and fetal movement were reviewed in detail with the patient. Please refer to After Visit Summary for other counseling recommendations.   Return in about 1 week (around 05/19/2023) for [facility].   Gabrielle Joiner, MD 05/12/2023

## 2023-05-15 ENCOUNTER — Ambulatory Visit: Attending: Obstetrics | Admitting: *Deleted

## 2023-05-15 ENCOUNTER — Ambulatory Visit: Admitting: *Deleted

## 2023-05-15 VITALS — BP 124/76 | HR 72

## 2023-05-15 DIAGNOSIS — O09899 Supervision of other high risk pregnancies, unspecified trimester: Secondary | ICD-10-CM

## 2023-05-15 DIAGNOSIS — O24410 Gestational diabetes mellitus in pregnancy, diet controlled: Secondary | ICD-10-CM | POA: Diagnosis not present

## 2023-05-15 DIAGNOSIS — O43199 Other malformation of placenta, unspecified trimester: Secondary | ICD-10-CM | POA: Diagnosis not present

## 2023-05-15 DIAGNOSIS — O99210 Obesity complicating pregnancy, unspecified trimester: Secondary | ICD-10-CM

## 2023-05-15 DIAGNOSIS — O99213 Obesity complicating pregnancy, third trimester: Secondary | ICD-10-CM | POA: Diagnosis not present

## 2023-05-15 DIAGNOSIS — O43193 Other malformation of placenta, third trimester: Secondary | ICD-10-CM | POA: Insufficient documentation

## 2023-05-15 DIAGNOSIS — Z3A35 35 weeks gestation of pregnancy: Secondary | ICD-10-CM

## 2023-05-15 NOTE — Procedures (Signed)
 Diana Jacobs 29-Apr-1990 [redacted]w[redacted]d  Fetus A Non-Stress Test Interpretation for 05/15/23 (NST only)  Indication: Obesity, GDM-diet  Fetal Heart Rate A Mode: External Baseline Rate (A): 135 bpm Variability: Moderate Accelerations: 15 x 15 Decelerations: None Multiple birth?: No  Uterine Activity Mode: Palpation, Toco Contraction Frequency (min): None Resting Tone Palpated: Relaxed Resting Time: Adequate  Interpretation (Fetal Testing) Nonstress Test Interpretation: Reactive Comments: Dr. Grayland Le reviewed tracing.

## 2023-05-19 ENCOUNTER — Ambulatory Visit: Admitting: Obstetrics & Gynecology

## 2023-05-19 ENCOUNTER — Other Ambulatory Visit (HOSPITAL_COMMUNITY)
Admission: RE | Admit: 2023-05-19 | Discharge: 2023-05-19 | Disposition: A | Discharge status: Home / Self Care | Source: Ambulatory Visit | Attending: Obstetrics & Gynecology | Admitting: Obstetrics & Gynecology

## 2023-05-19 VITALS — BP 121/78 | HR 94 | Wt 290.0 lb

## 2023-05-19 DIAGNOSIS — Z1331 Encounter for screening for depression: Secondary | ICD-10-CM

## 2023-05-19 DIAGNOSIS — O24410 Gestational diabetes mellitus in pregnancy, diet controlled: Secondary | ICD-10-CM | POA: Diagnosis not present

## 2023-05-19 DIAGNOSIS — O09899 Supervision of other high risk pregnancies, unspecified trimester: Secondary | ICD-10-CM

## 2023-05-19 DIAGNOSIS — O0990 Supervision of high risk pregnancy, unspecified, unspecified trimester: Secondary | ICD-10-CM | POA: Insufficient documentation

## 2023-05-19 DIAGNOSIS — O09893 Supervision of other high risk pregnancies, third trimester: Secondary | ICD-10-CM | POA: Diagnosis not present

## 2023-05-19 DIAGNOSIS — Z3A36 36 weeks gestation of pregnancy: Secondary | ICD-10-CM | POA: Diagnosis not present

## 2023-05-19 NOTE — Progress Notes (Signed)
HROB/GBS

## 2023-05-19 NOTE — Progress Notes (Deleted)
   PRENATAL VISIT NOTE  Subjective:  Diana Jacobs is a 33 y.o. W1U2725 at [redacted]w[redacted]d being seen today for ongoing prenatal care.  She is currently monitored for the following issues for this {Blank single:19197::"high-risk","low-risk"} pregnancy and has Obesity during pregnancy; Supervision of other high risk pregnancies, unspecified trimester; Diet controlled gestational diabetes mellitus in third trimester; and Marginal insertion of umbilical cord affecting management of mother on their problem list.  Patient reports {sx:[id number]}.  Contractions: Not present. Vag. Bleeding: None.  Movement: Present. Denies leaking of fluid.   The following portions of the patient's history were reviewed and updated as appropriate: allergies, current medications, past family history, past medical history, past social history, past surgical history and problem list.   Objective:    Vitals:   05/19/23 0935  BP: 121/78  Pulse: 94  Weight: 290 lb (131.5 kg)    Fetal Status:  Fetal Heart Rate (bpm): 140   Movement: Present    General: Alert, oriented and cooperative. Patient is in no acute distress.  Skin: Skin is warm and dry. No rash noted.   Cardiovascular: Normal heart rate noted  Respiratory: Normal respiratory effort, no problems with respiration noted  Abdomen: Soft, gravid, appropriate for gestational age.  Pain/Pressure: Absent     Pelvic: {Blank single:19197::"Cervical exam performed in the presence of a chaperone","Cervical exam deferred"}        Extremities: Normal range of motion.  Edema: Trace  Mental Status: Normal mood and affect. Normal behavior. Normal judgment and thought content.   Assessment and Plan:  Pregnancy: [name] at [redacted]w[redacted]d 1. Supervision of high risk pregnancy, antepartum (Primary) *** - Cervicovaginal ancillary only( [place]) - Culture, beta strep (group b only)  2. [redacted] weeks gestation of pregnancy *** - Cervicovaginal ancillary only( [place]) - Culture,  beta strep (group b only)  3. Supervision of other high risk pregnancies, unspecified trimester ***  4. Diet controlled gestational diabetes mellitus in third trimester ***  {Blank single:19197::"Term","Preterm"} labor symptoms and general obstetric precautions including but not limited to vaginal bleeding, contractions, leaking of fluid and fetal movement were reviewed in detail with the patient. Please refer to After Visit Summary for other counseling recommendations.   Return in about 1 week (around 05/26/2023).  Future Appointments  Date Time Provider Department Center  05/22/2023  8:00 AM [facility] PROVIDER 1 [facility]  05/22/2023  8:30 AM [facility] US4 [facility]  06/08/2023  9:00 AM [facility] PROVIDER 1 [facility]  06/08/2023  9:30 AM [facility] US1 [facility]    Onnie Bilis, MD

## 2023-05-19 NOTE — Progress Notes (Signed)
   PRENATAL VISIT NOTE  Subjective:  Diana Jacobs is a 33 y.o. [id number] at [redacted]w[redacted]d being seen today for ongoing prenatal care.  She is currently monitored for the following issues for this high-risk pregnancy and has Obesity during pregnancy; Supervision of other high risk pregnancies, unspecified trimester; Diet controlled gestational diabetes mellitus in third trimester; and Marginal insertion of umbilical cord affecting management of mother on their problem list.  Patient reports no complaints.  Contractions: Not present. Vag. Bleeding: None.  Movement: Present. Denies leaking of fluid.   The following portions of the patient's history were reviewed and updated as appropriate: allergies, current medications, past family history, past medical history, past social history, past surgical history and problem list.   Objective:    Vitals:   05/19/23 0935  BP: 121/78  Pulse: 94  Weight: 290 lb (131.5 kg)    Fetal Status:  Fetal Heart Rate (bpm): 140   Movement: Present    General: Alert, oriented and cooperative. Patient is in no acute distress.  Skin: Skin is warm and dry. No rash noted.   Cardiovascular: Normal heart rate noted  Respiratory: Normal respiratory effort, no problems with respiration noted  Abdomen: Soft, gravid, appropriate for gestational age.  Pain/Pressure: Absent     Pelvic: Cervical exam performed in the presence of a chaperone Dilation: Closed Effacement (%): 10 Station: Ballotable  Extremities: Normal range of motion.  Edema: Trace  Mental Status: Normal mood and affect. Normal behavior. Normal judgment and thought content.   Assessment and Plan:  Pregnancy: [name] at [redacted]w[redacted]d 1. Supervision of high risk pregnancy, antepartum (Primary) [redacted]w[redacted]d  - Cervicovaginal ancillary only( [place]) - Culture, beta strep (group b only)  2. [redacted] weeks gestation of pregnancy  - Cervicovaginal ancillary only( [place]) - Culture, beta strep (group b  only)  3. Supervision of other high risk pregnancies, unspecified trimester Rx NST this week, cephalic by last US   4. Diet controlled gestational diabetes mellitus in third trimester FBS < 95 and most PP <120  Preterm labor symptoms and general obstetric precautions including but not limited to vaginal bleeding, contractions, leaking of fluid and fetal movement were reviewed in detail with the patient. Please refer to After Visit Summary for other counseling recommendations.   Return in about 1 week (around 05/26/2023).  Future Appointments  Date Time Provider Department Center  05/22/2023  8:00 AM [facility] PROVIDER 1 [facility]  05/22/2023  8:30 AM [facility] US4 [facility]  05/26/2023 10:55 AM Loetta Ringer, CNM [facility] None  06/08/2023  7:00 AM [facility] SCHED ROOM [facility] None  06/08/2023  9:00 AM [facility] PROVIDER 1 [facility]  06/08/2023  9:30 AM [facility] US1 [facility]    Onnie Bilis, MD

## 2023-05-22 ENCOUNTER — Other Ambulatory Visit: Payer: Self-pay | Admitting: *Deleted

## 2023-05-22 ENCOUNTER — Ambulatory Visit (HOSPITAL_BASED_OUTPATIENT_CLINIC_OR_DEPARTMENT_OTHER): Admitting: Obstetrics

## 2023-05-22 ENCOUNTER — Ambulatory Visit: Attending: Obstetrics and Gynecology

## 2023-05-22 VITALS — BP 129/74 | HR 93

## 2023-05-22 DIAGNOSIS — O99213 Obesity complicating pregnancy, third trimester: Secondary | ICD-10-CM | POA: Insufficient documentation

## 2023-05-22 DIAGNOSIS — Z3A36 36 weeks gestation of pregnancy: Secondary | ICD-10-CM | POA: Insufficient documentation

## 2023-05-22 DIAGNOSIS — O99210 Obesity complicating pregnancy, unspecified trimester: Secondary | ICD-10-CM

## 2023-05-22 DIAGNOSIS — O24410 Gestational diabetes mellitus in pregnancy, diet controlled: Secondary | ICD-10-CM | POA: Insufficient documentation

## 2023-05-22 DIAGNOSIS — O09899 Supervision of other high risk pregnancies, unspecified trimester: Secondary | ICD-10-CM

## 2023-05-22 DIAGNOSIS — O43193 Other malformation of placenta, third trimester: Secondary | ICD-10-CM

## 2023-05-22 DIAGNOSIS — E669 Obesity, unspecified: Secondary | ICD-10-CM | POA: Diagnosis not present

## 2023-05-22 DIAGNOSIS — O43199 Other malformation of placenta, unspecified trimester: Secondary | ICD-10-CM | POA: Insufficient documentation

## 2023-05-22 DIAGNOSIS — O35BXX0 Maternal care for other (suspected) fetal abnormality and damage, fetal cardiac anomalies, not applicable or unspecified: Secondary | ICD-10-CM

## 2023-05-22 LAB — CERVICOVAGINAL ANCILLARY ONLY
Chlamydia: NEGATIVE
Comment: NEGATIVE
Comment: NORMAL
Neisseria Gonorrhea: NEGATIVE

## 2023-05-22 LAB — CULTURE, BETA STREP (GROUP B ONLY): Strep Gp B Culture: POSITIVE — AB

## 2023-05-22 NOTE — Progress Notes (Signed)
 MFM Consult Note  Diana Jacobs is currently at 36 weeks and 4 days.  She has been followed due to maternal obesity with a BMI of 47 and diet-controlled gestational diabetes.    She reports that she continues to experience whole body itching.  Her total bile acids drawn 1 month ago was within normal limits (1.8).  A biophysical profile performed today was 8/8.  There was normal amniotic fluid noted with a total AFI of 12.14 cm.    The fetus is in the vertex presentation.  Due to maternal obesity and gestational diabetes, we will continue to follow her with weekly fetal testing until delivery.    Due to her complaints of persistent whole body itching, she was sent to the lab to have repeat total bile acids drawn again today.  Should her total bile acids be elevated (10 or greater), delivery should occur at around 37 weeks.    She is currently scheduled for induction on June 08, 2023 (at 39 weeks).  Should she remain undelivered, she will return in 1 week for BPP and growth scan.  The patient stated that all of her questions were answered today.  A total of 20 minutes was spent counseling and coordinating the care for this patient.  Greater than 50% of the time was spent in direct face-to-face contact.

## 2023-05-24 LAB — BILE ACIDS, TOTAL: Bile Acids Total: 7.1 umol/L (ref 0.0–10.0)

## 2023-05-26 ENCOUNTER — Ambulatory Visit (INDEPENDENT_AMBULATORY_CARE_PROVIDER_SITE_OTHER)

## 2023-05-26 VITALS — BP 125/85 | HR 89 | Wt 294.0 lb

## 2023-05-26 DIAGNOSIS — O24410 Gestational diabetes mellitus in pregnancy, diet controlled: Secondary | ICD-10-CM

## 2023-05-26 DIAGNOSIS — Z3A37 37 weeks gestation of pregnancy: Secondary | ICD-10-CM

## 2023-05-26 DIAGNOSIS — O99210 Obesity complicating pregnancy, unspecified trimester: Secondary | ICD-10-CM

## 2023-05-26 DIAGNOSIS — L299 Pruritus, unspecified: Secondary | ICD-10-CM

## 2023-05-26 DIAGNOSIS — O09899 Supervision of other high risk pregnancies, unspecified trimester: Secondary | ICD-10-CM | POA: Diagnosis not present

## 2023-05-26 NOTE — Progress Notes (Unsigned)
   HIGH-RISK PREGNANCY OFFICE VISIT  Patient name: Diana Jacobs MRN [id number]  Date of birth: 25-Dec-1990 Chief Complaint:   Routine Prenatal Visit  Subjective:   Diana Jacobs is a 33 y.o. G34P1021 female at [redacted]w[redacted]d with an Estimated Date of Delivery: 06/15/23 being seen today for ongoing management of a high-risk pregnancy aeb has Obesity during pregnancy; Supervision of other high risk pregnancies, unspecified trimester; Diet controlled gestational diabetes mellitus in third trimester; and Marginal insertion of umbilical cord affecting management of mother on their problem list.  Patient presents today, with son, with no complaints.  However, patient requests results for blood work. She reports continued itching. Patient endorses fetal movement. Patient endorses abdominal cramping or contractions that was occurring every 2 minutes yesterday, but none today. Patient denies vaginal concerns including abnormal discharge, leaking of fluid, and bleeding. No issues with urination, constipation, or diarrhea.    Contractions: Irregular. Vag. Bleeding: None.  Movement: Present.  Reviewed past medical,surgical, social, obstetrical and family history as well as problem list, medications and allergies.  Objective   Vitals:   05/26/23 1058  BP: 125/85  Pulse: 89  Weight: 294 lb (133.4 kg)  Body mass index is 50.46 kg/m.  Total Weight Gain:20 lb (9.072 kg)         Physical Examination:   General appearance: Well appearing, and in no distress  Mental status: Alert, oriented to person, place, and time  Skin: Warm & dry  Cardiovascular: Normal heart rate noted  Respiratory: Normal respiratory effort, no distress  Abdomen: Soft, gravid, nontender, ***GA with    Pelvic: Cervical exam deferred           Extremities: Edema: Trace  Fetal Status: Fetal Heart Rate (bpm): 130  Movement: Present   No results found for this or any previous visit (from the past 24 hours).   Assessment & Plan:  ***-risk pregnancy of a 33 y.o., [id number] at [redacted]w[redacted]d with an Estimated Date of Delivery: 06/15/23   1. Supervision of other high risk pregnancies, unspecified trimester ***  2. Obesity during pregnancy -TWG 20lbs -BMI 50.46   3. Diet controlled gestational diabetes mellitus in third trimester -Did  not bring copy of log. -Reports "its about the same."  4. [redacted] weeks gestation of pregnancy ***      Meds: No orders of the defined types were placed in this encounter.  Labs/procedures today:  Lab Orders  No laboratory test(s) ordered today     Reviewed: {Blank single:19197::"Term","Preterm"} labor symptoms and general obstetric precautions including but not limited to vaginal bleeding, contractions, leaking of fluid and fetal movement were reviewed in detail with the patient.  All questions were answered.  Follow-up: No follow-ups on file.  No orders of the defined types were placed in this encounter.  Kraig Peru MSN, CNM 05/26/2023

## 2023-05-26 NOTE — Progress Notes (Unsigned)
 Trace swelling in feet at times. Asking about blood test results.

## 2023-05-26 NOTE — Patient Instructions (Signed)

## 2023-05-28 LAB — COMPREHENSIVE METABOLIC PANEL WITH GFR
ALT: 12 IU/L (ref 0–32)
AST: 20 IU/L (ref 0–40)
Albumin: 3.6 g/dL — ABNORMAL LOW (ref 3.9–4.9)
Alkaline Phosphatase: 172 IU/L — ABNORMAL HIGH (ref 44–121)
BUN/Creatinine Ratio: 10 (ref 9–23)
BUN: 8 mg/dL (ref 6–20)
Bilirubin Total: 0.3 mg/dL (ref 0.0–1.2)
CO2: 18 mmol/L — ABNORMAL LOW (ref 20–29)
Calcium: 9.2 mg/dL (ref 8.7–10.2)
Chloride: 102 mmol/L (ref 96–106)
Creatinine, Ser: 0.83 mg/dL (ref 0.57–1.00)
Globulin, Total: 3.2 g/dL (ref 1.5–4.5)
Glucose: 66 mg/dL — ABNORMAL LOW (ref 70–99)
Potassium: 4.5 mmol/L (ref 3.5–5.2)
Sodium: 136 mmol/L (ref 134–144)
Total Protein: 6.8 g/dL (ref 6.0–8.5)
eGFR: 95 mL/min/{1.73_m2} (ref >59)

## 2023-05-28 LAB — BILE ACIDS, TOTAL: Bile Acids Total: 1.1 umol/L (ref 0.0–10.0)

## 2023-05-30 ENCOUNTER — Ambulatory Visit: Payer: Self-pay

## 2023-06-02 ENCOUNTER — Encounter: Admitting: Obstetrics and Gynecology

## 2023-06-02 ENCOUNTER — Inpatient Hospital Stay (HOSPITAL_COMMUNITY): Admitting: Anesthesiology

## 2023-06-02 ENCOUNTER — Encounter (HOSPITAL_COMMUNITY): Payer: Self-pay | Admitting: Obstetrics & Gynecology

## 2023-06-02 ENCOUNTER — Inpatient Hospital Stay (HOSPITAL_COMMUNITY)
Admission: AD | Admit: 2023-06-02 | Discharge: 2023-06-04 | DRG: 797 | Disposition: A | Discharge status: Home / Self Care | Attending: Obstetrics and Gynecology | Admitting: Obstetrics and Gynecology

## 2023-06-02 ENCOUNTER — Other Ambulatory Visit: Payer: Self-pay

## 2023-06-02 DIAGNOSIS — O24420 Gestational diabetes mellitus in childbirth, diet controlled: Secondary | ICD-10-CM | POA: Diagnosis present

## 2023-06-02 DIAGNOSIS — E66813 Obesity, class 3: Secondary | ICD-10-CM | POA: Diagnosis present

## 2023-06-02 DIAGNOSIS — Z7982 Long term (current) use of aspirin: Secondary | ICD-10-CM | POA: Diagnosis not present

## 2023-06-02 DIAGNOSIS — O99210 Obesity complicating pregnancy, unspecified trimester: Secondary | ICD-10-CM

## 2023-06-02 DIAGNOSIS — O139 Gestational [pregnancy-induced] hypertension without significant proteinuria, unspecified trimester: Secondary | ICD-10-CM

## 2023-06-02 DIAGNOSIS — Z3A38 38 weeks gestation of pregnancy: Secondary | ICD-10-CM

## 2023-06-02 DIAGNOSIS — O99824 Streptococcus B carrier state complicating childbirth: Secondary | ICD-10-CM | POA: Diagnosis present

## 2023-06-02 DIAGNOSIS — Z8619 Personal history of other infectious and parasitic diseases: Secondary | ICD-10-CM

## 2023-06-02 DIAGNOSIS — O99820 Streptococcus B carrier state complicating pregnancy: Secondary | ICD-10-CM | POA: Diagnosis not present

## 2023-06-02 DIAGNOSIS — Z833 Family history of diabetes mellitus: Secondary | ICD-10-CM | POA: Diagnosis not present

## 2023-06-02 DIAGNOSIS — O43199 Other malformation of placenta, unspecified trimester: Secondary | ICD-10-CM

## 2023-06-02 DIAGNOSIS — E119 Type 2 diabetes mellitus without complications: Secondary | ICD-10-CM | POA: Diagnosis not present

## 2023-06-02 DIAGNOSIS — Z8249 Family history of ischemic heart disease and other diseases of the circulatory system: Secondary | ICD-10-CM

## 2023-06-02 DIAGNOSIS — Z302 Encounter for sterilization: Secondary | ICD-10-CM | POA: Diagnosis not present

## 2023-06-02 DIAGNOSIS — O9832 Other infections with a predominantly sexual mode of transmission complicating childbirth: Secondary | ICD-10-CM | POA: Diagnosis present

## 2023-06-02 DIAGNOSIS — I10 Essential (primary) hypertension: Secondary | ICD-10-CM | POA: Diagnosis not present

## 2023-06-02 DIAGNOSIS — O99214 Obesity complicating childbirth: Secondary | ICD-10-CM | POA: Diagnosis present

## 2023-06-02 DIAGNOSIS — O134 Gestational [pregnancy-induced] hypertension without significant proteinuria, complicating childbirth: Secondary | ICD-10-CM | POA: Diagnosis not present

## 2023-06-02 DIAGNOSIS — O43193 Other malformation of placenta, third trimester: Secondary | ICD-10-CM | POA: Diagnosis present

## 2023-06-02 DIAGNOSIS — A6000 Herpesviral infection of urogenital system, unspecified: Secondary | ICD-10-CM | POA: Diagnosis present

## 2023-06-02 DIAGNOSIS — O133 Gestational [pregnancy-induced] hypertension without significant proteinuria, third trimester: Principal | ICD-10-CM

## 2023-06-02 DIAGNOSIS — O09899 Supervision of other high risk pregnancies, unspecified trimester: Secondary | ICD-10-CM

## 2023-06-02 DIAGNOSIS — O4423 Partial placenta previa NOS or without hemorrhage, third trimester: Secondary | ICD-10-CM | POA: Diagnosis not present

## 2023-06-02 DIAGNOSIS — O24410 Gestational diabetes mellitus in pregnancy, diet controlled: Secondary | ICD-10-CM

## 2023-06-02 LAB — CBC WITH DIFFERENTIAL/PLATELET
Abs Immature Granulocytes: 0.02 10*3/uL (ref 0.00–0.07)
Basophils Absolute: 0 10*3/uL (ref 0.0–0.1)
Basophils Relative: 1 %
Eosinophils Absolute: 0 10*3/uL (ref 0.0–0.5)
Eosinophils Relative: 0 %
HCT: 28.4 % — ABNORMAL LOW (ref 36.0–46.0)
Hemoglobin: 8.9 g/dL — ABNORMAL LOW (ref 12.0–15.0)
Immature Granulocytes: 0 %
Lymphocytes Relative: 26 %
Lymphs Abs: 1.5 10*3/uL (ref 0.7–4.0)
MCH: 24.4 pg — ABNORMAL LOW (ref 26.0–34.0)
MCHC: 31.3 g/dL (ref 30.0–36.0)
MCV: 77.8 fL — ABNORMAL LOW (ref 80.0–100.0)
Monocytes Absolute: 0.5 10*3/uL (ref 0.1–1.0)
Monocytes Relative: 8 %
Neutro Abs: 3.7 10*3/uL (ref 1.7–7.7)
Neutrophils Relative %: 65 %
Platelets: 241 10*3/uL (ref 150–400)
RBC: 3.65 MIL/uL — ABNORMAL LOW (ref 3.87–5.11)
RDW: 15.9 % — ABNORMAL HIGH (ref 11.5–15.5)
WBC: 5.7 10*3/uL (ref 4.0–10.5)
nRBC: 0 % (ref 0.0–0.2)

## 2023-06-02 LAB — COMPREHENSIVE METABOLIC PANEL WITH GFR
ALT: 12 U/L (ref 0–44)
AST: 20 U/L (ref 15–41)
Albumin: 2.9 g/dL — ABNORMAL LOW (ref 3.5–5.0)
Alkaline Phosphatase: 145 U/L — ABNORMAL HIGH (ref 38–126)
Anion gap: 7 (ref 5–15)
BUN: 6 mg/dL (ref 6–20)
CO2: 22 mmol/L (ref 22–32)
Calcium: 8.8 mg/dL — ABNORMAL LOW (ref 8.9–10.3)
Chloride: 105 mmol/L (ref 98–111)
Creatinine, Ser: 0.81 mg/dL (ref 0.44–1.00)
GFR, Estimated: >60 mL/min (ref >60)
Glucose, Bld: 83 mg/dL (ref 70–99)
Potassium: 3.8 mmol/L (ref 3.5–5.1)
Sodium: 134 mmol/L — ABNORMAL LOW (ref 135–145)
Total Bilirubin: 0.4 mg/dL (ref 0.0–1.2)
Total Protein: 6.9 g/dL (ref 6.5–8.1)

## 2023-06-02 LAB — CBC
HCT: 31.7 % — ABNORMAL LOW (ref 36.0–46.0)
Hemoglobin: 9.8 g/dL — ABNORMAL LOW (ref 12.0–15.0)
MCH: 24.5 pg — ABNORMAL LOW (ref 26.0–34.0)
MCHC: 30.9 g/dL (ref 30.0–36.0)
MCV: 79.3 fL — ABNORMAL LOW (ref 80.0–100.0)
Platelets: 275 10*3/uL (ref 150–400)
RBC: 4 MIL/uL (ref 3.87–5.11)
RDW: 16.1 % — ABNORMAL HIGH (ref 11.5–15.5)
WBC: 5.4 10*3/uL (ref 4.0–10.5)
nRBC: 0.4 % — ABNORMAL HIGH (ref 0.0–0.2)

## 2023-06-02 LAB — PROTEIN / CREATININE RATIO, URINE
Creatinine, Urine: 252 mg/dL
Protein Creatinine Ratio: 0.08 mg/mg{creat} (ref 0.00–0.15)
Total Protein, Urine: 21 mg/dL

## 2023-06-02 LAB — TYPE AND SCREEN
ABO/RH(D): B POS
Antibody Screen: NEGATIVE

## 2023-06-02 LAB — RPR: RPR Ser Ql: NONREACTIVE

## 2023-06-02 LAB — GLUCOSE, CAPILLARY
Glucose-Capillary: 89 mg/dL (ref 70–99)
Glucose-Capillary: 73 mg/dL (ref 70–99)
Glucose-Capillary: 71 mg/dL (ref 70–99)

## 2023-06-02 MED ORDER — DIPHENHYDRAMINE HCL 50 MG/ML IJ SOLN: 12.5000 mg | INTRAMUSCULAR | Status: DC | PRN

## 2023-06-02 MED ORDER — LACTATED RINGERS IV SOLN: INTRAVENOUS | Status: DC

## 2023-06-02 MED ORDER — MISOPROSTOL 50MCG HALF TABLET: 50.0000 ug | ORAL_TABLET | Freq: Once | ORAL | Status: DC

## 2023-06-02 MED ORDER — TERBUTALINE SULFATE 1 MG/ML IJ SOLN: 0.2500 mg | Freq: Once | INTRAMUSCULAR | Status: DC | PRN

## 2023-06-02 MED ORDER — SOD CITRATE-CITRIC ACID 500-334 MG/5ML PO SOLN: 30.0000 mL | ORAL | Status: DC | PRN

## 2023-06-02 MED ORDER — LIDOCAINE HCL (PF) 1 % IJ SOLN: 30.0000 mL | INTRAMUSCULAR | Status: DC | PRN

## 2023-06-02 MED ORDER — PHENYLEPHRINE 80 MCG/ML (10ML) SYRINGE FOR IV PUSH (FOR BLOOD PRESSURE SUPPORT): 80.0000 ug | PREFILLED_SYRINGE | INTRAVENOUS | Status: DC | PRN

## 2023-06-02 MED ORDER — OXYCODONE-ACETAMINOPHEN 5-325 MG PO TABS: 2.0000 | ORAL_TABLET | ORAL | Status: DC | PRN

## 2023-06-02 MED ORDER — OXYTOCIN BOLUS FROM INFUSION
333.0000 mL | Freq: Once | INTRAVENOUS | Status: AC
  Administered 2023-06-02: 333 mL via INTRAVENOUS

## 2023-06-02 MED ORDER — HYDROXYZINE HCL 50 MG PO TABS
50.0000 mg | ORAL_TABLET | Freq: Four times a day (QID) | ORAL | Status: DC | PRN
  Administered 2023-06-02: 50 mg via ORAL
  Filled 2023-06-02: qty 1

## 2023-06-02 MED ORDER — OXYTOCIN-SODIUM CHLORIDE 30-0.9 UT/500ML-% IV SOLN
1.0000 m[IU]/min | INTRAVENOUS | Status: DC
  Administered 2023-06-02: 10 m[IU]/min via INTRAVENOUS
  Administered 2023-06-02: 2 m[IU]/min via INTRAVENOUS

## 2023-06-02 MED ORDER — DIPHENHYDRAMINE HCL 50 MG/ML IJ SOLN: 25.0000 mg | Freq: Four times a day (QID) | INTRAMUSCULAR | Status: DC | PRN

## 2023-06-02 MED ORDER — LABETALOL HCL 5 MG/ML IV SOLN: 80.0000 mg | INTRAVENOUS | Status: DC | PRN

## 2023-06-02 MED ORDER — LACTATED RINGERS IV SOLN
500.0000 mL | INTRAVENOUS | Status: DC | PRN
  Administered 2023-06-02: 500 mL via INTRAVENOUS

## 2023-06-02 MED ORDER — LACTATED RINGERS IV SOLN: 500.0000 mL | Freq: Once | INTRAVENOUS | Status: AC

## 2023-06-02 MED ORDER — SODIUM CHLORIDE 0.9 % IV SOLN
5.0000 10*6.[IU] | Freq: Once | INTRAVENOUS | Status: AC
  Administered 2023-06-02: 5 10*6.[IU] via INTRAVENOUS
  Filled 2023-06-02: qty 5

## 2023-06-02 MED ORDER — LACTATED RINGERS IV SOLN
500.0000 mL | Freq: Once | INTRAVENOUS | Status: AC
  Administered 2023-06-02: 500 mL via INTRAVENOUS

## 2023-06-02 MED ORDER — LIDOCAINE-EPINEPHRINE (PF) 2 %-1:200000 IJ SOLN
INTRAMUSCULAR | Status: DC | PRN
  Administered 2023-06-02: 33 mL via EPIDURAL
  Administered 2023-06-02: 2 mL via EPIDURAL
  Administered 2023-06-02: 5 mL via EPIDURAL

## 2023-06-02 MED ORDER — LABETALOL HCL 5 MG/ML IV SOLN: 40.0000 mg | INTRAVENOUS | Status: DC | PRN

## 2023-06-02 MED ORDER — LABETALOL HCL 5 MG/ML IV SOLN: 20.0000 mg | INTRAVENOUS | Status: DC | PRN

## 2023-06-02 MED ORDER — DIPHENHYDRAMINE HCL 50 MG/ML IJ SOLN: 12.5000 mg | INTRAMUSCULAR | Status: DC

## 2023-06-02 MED ORDER — EPHEDRINE 5 MG/ML INJ: 10.0000 mg | INTRAVENOUS | Status: DC | PRN

## 2023-06-02 MED ORDER — FENTANYL-BUPIVACAINE-NACL 0.5-0.125-0.9 MG/250ML-% EP SOLN
12.0000 mL/h | EPIDURAL | Status: DC | PRN
  Administered 2023-06-02: 12 mL/h via EPIDURAL
  Filled 2023-06-02: qty 250

## 2023-06-02 MED ORDER — PENICILLIN G POT IN DEXTROSE 60000 UNIT/ML IV SOLN
3.0000 10*6.[IU] | INTRAVENOUS | Status: DC
  Administered 2023-06-02 (×2): 3 10*6.[IU] via INTRAVENOUS
  Filled 2023-06-02 (×2): qty 50

## 2023-06-02 MED ORDER — OXYTOCIN-SODIUM CHLORIDE 30-0.9 UT/500ML-% IV SOLN
2.5000 [IU]/h | INTRAVENOUS | Status: DC
  Administered 2023-06-02: 2.5 [IU]/h via INTRAVENOUS
  Filled 2023-06-02: qty 500

## 2023-06-02 MED ORDER — HYDRALAZINE HCL 20 MG/ML IJ SOLN: 10.0000 mg | INTRAMUSCULAR | Status: DC | PRN

## 2023-06-02 MED ORDER — ACETAMINOPHEN 325 MG PO TABS: 650.0000 mg | ORAL_TABLET | ORAL | Status: DC | PRN

## 2023-06-02 MED ORDER — OXYCODONE-ACETAMINOPHEN 5-325 MG PO TABS: 1.0000 | ORAL_TABLET | ORAL | Status: DC | PRN

## 2023-06-02 MED ORDER — DIPHENHYDRAMINE HCL 50 MG/ML IJ SOLN
12.5000 mg | INTRAMUSCULAR | Status: AC | PRN
  Administered 2023-06-02 (×3): 12.5 mg via INTRAVENOUS
  Filled 2023-06-02 (×2): qty 1

## 2023-06-02 MED ORDER — ONDANSETRON HCL 4 MG/2ML IJ SOLN: 4.0000 mg | Freq: Four times a day (QID) | INTRAMUSCULAR | Status: DC | PRN

## 2023-06-02 MED ORDER — FENTANYL CITRATE (PF) 100 MCG/2ML IJ SOLN
50.0000 ug | INTRAMUSCULAR | Status: DC | PRN
  Administered 2023-06-02: 100 ug via INTRAVENOUS
  Filled 2023-06-02: qty 2

## 2023-06-02 NOTE — MAU Provider Note (Signed)
 Chief Complaint:  Contractions   HPI     Diana Jacobs is a 33 y.o. [id number] at [redacted]w[redacted]d who presents to maternity admissions reporting contractions for the past hour. She denies any VB, LOF and reports good FM's. On presentation  her BP was also elevated 153/90. She denies any HA's, visual changes, RUQ pain, N/V  Pregnancy Course: [facility]  Past Medical History:  Diagnosis Date   ASCUS with positive high risk HPV cervical pap smears    04/17/2013, 10/22/2013 and 06/25/2014. Benign colposcopy in 2015.   Herpes    Reports being told she had herpes, no lesions   Pregnancy    OB History  Gravida Para Term Preterm AB Living  4 1 1  2 1   SAB IAB Ectopic Multiple Live Births  1 1   1     # Outcome Date GA Lbr Len/2nd Weight Sex Type Anes PTL Lv  4 Current           3 Term 02/28/13 [redacted]w[redacted]d / 01:22 3765 g M Vag-Spont EPI  LIV  2 IAB 2015          1 SAB 02/02/11             Birth Comments: 7wks   Past Surgical History:  Procedure Laterality Date   EYE SURGERY     EYE SURGERY     TOOTH EXTRACTION     Family History  Problem Relation Age of Onset   Diabetes Mother    Hypertension Mother    Diabetes Father    Social History   Tobacco Use   Smoking status: Never   Smokeless tobacco: Never  Vaping Use   Vaping status: Never Used  Substance Use Topics   Alcohol use: Not Currently   Drug use: No   No Known Allergies Medications Prior to Admission  Medication Sig Dispense Refill Last Dose/Taking   aspirin  81 MG chewable tablet Chew 1 tablet (81 mg total) by mouth daily. 90 tablet 3 Past Week   Prenatal Vit-Fe Fumarate-FA (PREPLUS) 27-1 MG TABS Take 1 tablet by mouth daily. 30 tablet 13 06/01/2023   valACYclovir  (VALTREX ) 1000 MG tablet Take 1 tablet (1,000 mg total) by mouth daily. For suppression. 30 tablet 11 Past Week   Accu-Chek Softclix Lancets lancets Use as instructed 100 each 12    Blood Glucose Monitoring Suppl (ACCU-CHEK GUIDE) w/Device KIT 1 Device by Does not  apply route 4 (four) times daily. 1 kit 0    Blood Pressure Monitoring (BLOOD PRESSURE KIT) DEVI 1 Device by Does not apply route once a week. 1 each 0    glucose blood (ACCU-CHEK GUIDE TEST) test strip Use as instructed 100 each 12    hydrocortisone  1 % lotion Apply 1 Application topically 2 (two) times daily. (Patient not taking: Reported on 05/26/2023) 118 mL 0    hydrOXYzine  (ATARAX ) 25 MG tablet Take 1 tablet (25 mg total) by mouth every 6 (six) hours as needed for itching. (Patient not taking: Reported on 05/26/2023) 30 tablet 2     I have reviewed patient's Past Medical Hx, Surgical Hx, Family Hx, Social Hx, medications and allergies.   ROS  Pertinent items noted in HPI and remainder of comprehensive ROS otherwise negative.   PHYSICAL EXAM   Patient Vitals for the past 24 hrs:  BP Temp Pulse Resp SpO2 Height Weight  06/02/23 0446 139/86 -- 88 -- -- -- --  06/02/23 0433 (!) 140/91 -- 79 -- 96 % -- --  06/02/23 0423 (!) 153/90 -- 86 -- -- -- --  06/02/23 0353 138/84 -- -- -- -- -- --  06/02/23 0348 -- 97.8 F (36.6 C) 91 (!) 22 100 % 5\' 4"  (1.626 m) 132 kg    Constitutional: Well-developed, obese female in no acute distress.  Cardiovascular: normal rate & rhythm, warm and well-perfused Respiratory: normal effort, no problems with respiration noted GI: Abd soft, non-tender, gravid MS: Extremities nontender, no edema, normal ROM Neurologic: Alert and oriented x 4.  GU: no CVA tenderness Pelvic: NEFG, physiologic discharge, no blood, cervix clean.   Dilation: 1.5 Effacement (%): Thick Cervical Position: Posterior Exam by:: Eustacio Highman, RN  Fetal Tracing: Cat 1 reactive Baseline: 130 Variability: moderate  Accelerations: present Decelerations: absent Toco: q 3 minutes   Labs: No results found for this or any previous visit (from the past 24 hours).  Imaging:  No results found.  MDM & MAU COURSE  MDM:  HIGH- Admit  After review of Blood pressures  Admission  for IOL (elevated BP's noted on 05/12/22 at 154/93 and 133/92) and today's elevated BP's meet criteria for gHTN  Will obtain PreE Labs Discussed with LD Team and RN's aware to transfer to LD   MAU Course: Orders Placed This Encounter  Procedures   Comprehensive metabolic panel   Protein / creatinine ratio, urine   Contraction - monitoring   External fetal heart monitoring   Vaginal exam   Cervical Exam   Insert urethral catheter X 1 PRN If Coude Catheter is chosen, qualified resources by campus can be found in the clinical skills nursing procedure for Coude Catheter 1. If straight catheterized > 2 times or patient unable to void post epidural plac...   Notify physician (specify) Confirmatory reading of BP> 160/110 15 minutes later   Apply Hypertensive Disorders of Pregnancy Care Plan   Measure blood pressure   POCT fern test   Meds ordered this encounter  Medications   AND Linked Order Group    labetalol  (NORMODYNE ) injection 20 mg    labetalol  (NORMODYNE ) injection 40 mg    labetalol  (NORMODYNE ) injection 80 mg    hydrALAZINE (APRESOLINE) injection 10 mg    ASSESSMENT   1. Gestational hypertension, third trimester   2. Supervision of other high risk pregnancies, unspecified trimester   3. Diet controlled gestational diabetes mellitus in third trimester   4. Obesity during pregnancy   5. Marginal insertion of umbilical cord affecting management of mother   6. History of herpes genitalis     PLAN  ADMIT to LDR for IOL   Debbe Fail, MSN, [facility], Center for [employer]

## 2023-06-02 NOTE — Progress Notes (Signed)
 Labor progress note:   Pt feeling significantly more pain, requested IV pain meds. Cat I tracing. Cervix 3/50/ballotable. Will start Pitocin  and re-eval for AROM at next check.   Gestational HTN: BP have been high-normal - 130s/80s  PEC labs neg  Anemia: HgB 9.8  TXA at delivery  A1GDM: Baby AGA on last growth U/S  CBG have been ok, will cont to check  Melanie Spires, MD OB Fellow, Faculty Practice [facility], Center for [facility]

## 2023-06-02 NOTE — H&P (Signed)
 LABOR AND DELIVERY ADMISSION HISTORY AND PHYSICAL NOTE  Diana Jacobs is a 33 y.o. female [phone] with IUP at [redacted]w[redacted]d presenting for contractions. Ruled-in for gestational hypertension and admitted for IOL.   Patient reports the fetal movement as active. Patient reports uterine contraction activity as regular, every 2-3 minutes. Patient reports  vaginal bleeding as none. Patient describes fluid per vagina as None.   Patient denies headache, vision changes, chest pain, shortness of breath, right upper quadrant pain, or LE edema.  She plans on bottle feeding. Her contraception plan is: bilateral tubal ligation.  Prenatal History/Complications: PNC at [facility] - A1GDM - Obesity - Marginal cord - Hx HSV on valtrex   Sono:  @[redacted]w[redacted]d , CWD, normal anatomy, cephalic presentation, anterior placenta, 56%ile  Pregnancy complications:  Patient Active Problem List   Diagnosis Date Noted   Gestational hypertension, third trimester 06/02/2023   Gestational hypertension 06/02/2023   Marginal insertion of umbilical cord affecting management of mother 05/01/2023   Diet controlled gestational diabetes mellitus in third trimester 01/05/2023   Obesity during pregnancy 12/08/2022   Supervision of other high risk pregnancies, unspecified trimester 11/02/2022    Past Medical History: Past Medical History:  Diagnosis Date   ASCUS with positive high risk HPV cervical pap smears    04/17/2013, 10/22/2013 and 06/25/2014. Benign colposcopy in 2015.   Herpes    Reports being told she had herpes, no lesions   Pregnancy     Past Surgical History: Past Surgical History:  Procedure Laterality Date   EYE SURGERY     EYE SURGERY     TOOTH EXTRACTION      Obstetrical History: OB History     Gravida  4   Para  1   Term  1   Preterm      AB  2   Living  1      SAB  1   IAB  1   Ectopic      Multiple      Live Births  1           Social History: Social History    Socioeconomic History   Marital status: Single    Spouse name: Not on file   Number of children: Not on file   Years of education: Not on file   Highest education level: Not on file  Occupational History   Not on file  Tobacco Use   Smoking status: Never   Smokeless tobacco: Never  Vaping Use   Vaping status: Never Used  Substance and Sexual Activity   Alcohol use: Not Currently   Drug use: No   Sexual activity: Yes    Birth control/protection: None  Other Topics Concern   Not on file  Social History Narrative   Not on file   Social Drivers of Health   Financial Resource Strain: Not on file  Food Insecurity: No Food Insecurity (06/02/2023)   Hunger Vital Sign    Worried About Running Out of Food in the Last Year: Never true    Ran Out of Food in the Last Year: Never true  Transportation Needs: No Transportation Needs (06/02/2023)   PRAPARE - Administrator, Civil Service (Medical): No    Lack of Transportation (Non-Medical): No  Physical Activity: Not on file  Stress: Not on file  Social Connections: Moderately Isolated (06/02/2023)   Social Connection and Isolation Panel [NHANES]    Frequency of Communication with Friends and Family: More than three times a  week    Frequency of Social Gatherings with Friends and Family: Three times a week    Attends Religious Services: More than 4 times per year    Active Member of Clubs or Organizations: No    Attends Banker Meetings: Never    Marital Status: Never married    Family History: Family History  Problem Relation Age of Onset   Diabetes Mother    Hypertension Mother    Diabetes Father     Allergies: No Known Allergies  Medications Prior to Admission  Medication Sig Dispense Refill Last Dose/Taking   aspirin  81 MG chewable tablet Chew 1 tablet (81 mg total) by mouth daily. 90 tablet 3 Past Week   Prenatal Vit-Fe Fumarate-FA (PREPLUS) 27-1 MG TABS Take 1 tablet by mouth daily. 30 tablet  13 06/01/2023   valACYclovir  (VALTREX ) 1000 MG tablet Take 1 tablet (1,000 mg total) by mouth daily. For suppression. 30 tablet 11 05/28/2023   Accu-Chek Softclix Lancets lancets Use as instructed 100 each 12    Blood Glucose Monitoring Suppl (ACCU-CHEK GUIDE) w/Device KIT 1 Device by Does not apply route 4 (four) times daily. 1 kit 0    Blood Pressure Monitoring (BLOOD PRESSURE KIT) DEVI 1 Device by Does not apply route once a week. 1 each 0    glucose blood (ACCU-CHEK GUIDE TEST) test strip Use as instructed 100 each 12    hydrocortisone  1 % lotion Apply 1 Application topically 2 (two) times daily. (Patient not taking: Reported on 05/26/2023) 118 mL 0    hydrOXYzine  (ATARAX ) 25 MG tablet Take 1 tablet (25 mg total) by mouth every 6 (six) hours as needed for itching. (Patient not taking: Reported on 05/26/2023) 30 tablet 2      Review of Systems  All systems reviewed and negative except as stated in HPI  Physical Exam BP (!) 155/94 (BP Location: Right Arm)   Pulse 80   Temp 98.2 F (36.8 C) (Axillary)   Resp 18   Ht 5\' 4"  (1.626 m)   Wt 132 kg   LMP 09/02/2022   SpO2 100%   BMI 49.95 kg/m   Physical Exam Constitutional:      General: She is not in acute distress.    Appearance: She is not ill-appearing.     Comments: Uncomfortable with contractions  Cardiovascular:     Rate and Rhythm: Normal rate.  Pulmonary:     Effort: Pulmonary effort is normal.  Abdominal:     Comments: Gravid  Musculoskeletal:        General: No swelling.  Skin:    General: Skin is warm and dry.  Neurological:     General: No focal deficit present.  Psychiatric:        Mood and Affect: Mood normal.   Presentation: cephalic by BSUS  Fetal monitoring: Baseline: 130 bpm, Variability: Good {> 6 bpm), Accelerations: Reactive, and Decelerations: Absent Uterine activity: Q2-3  Dilation: 1.5 Effacement (%): Thick Cervical Position: Posterior Exam by:: Eustacio Highman, RN  Prenatal labs: ABO, Rh:  --/--/B POS (05/30 1610) Antibody: NEG (05/30 9604) Rubella: 5.20 (10/30 1018) RPR: Non Reactive (03/27 0903)  HBsAg: Negative (10/30 1018)  HIV: Non Reactive (03/27 0903)  GC/Chlamydia:  Neisseria Gonorrhea  Date Value Ref Range Status  05/19/2023 Negative  Final   Chlamydia  Date Value Ref Range Status  05/19/2023 Negative  Final   GBS: Positive/-- (05/16 1024)   Prenatal Transfer Tool  Maternal Diabetes: Yes:  Diabetes Type:  Diet controlled Genetic Screening: Normal Maternal Ultrasounds/Referrals: Isolated EIF (echogenic intracardiac focus) Fetal Ultrasounds or other Referrals:  None Maternal Substance Abuse:  No Significant Maternal Medications: Valtrex  Significant Maternal Lab Results: Group B Strep positive  Results for orders placed or performed during the hospital encounter of 06/02/23 (from the past 24 hours)  CBC   Collection Time: 06/02/23  5:25 AM  Result Value Ref Range   WBC 5.4 4.0 - 10.5 K/uL   RBC 4.00 3.87 - 5.11 MIL/uL   Hemoglobin 9.8 (L) 12.0 - 15.0 g/dL   HCT 08.6 (L) 57.8 - 46.9 %   MCV 79.3 (L) 80.0 - 100.0 fL   MCH 24.5 (L) 26.0 - 34.0 pg   MCHC 30.9 30.0 - 36.0 g/dL   RDW 62.9 (H) 52.8 - 41.3 %   Platelets 275 150 - 400 K/uL   nRBC 0.4 (H) 0.0 - 0.2 %  Type and screen [facility]   Collection Time: 06/02/23  6:05 AM  Result Value Ref Range   ABO/RH(D) B POS    Antibody Screen NEG    Sample Expiration      06/05/2023,2359 Performed at [facility], [address], [place], [place] [address]     Assessment: Valisha Heslin is a 33 y.o. [phone] at [redacted]w[redacted]d here for contractions. Found to rule-in for gestational hypertension.  #Labor: Expectantly manage for now given regular contractions. Discussed options for induction if she does not progress on her own #Pain: IV pain meds PRN, epidural upon request #FHT: Category I #GBS/ID: Positive > PCN #MOF: bottle feeding #MOC: bilateral tubal ligation  #gHTN:  New diagnosis. Labs pending. No s/sx of preE at this time #A1GDM: Q4 latent/Q2 active CBG. EFW 56% #Hx HSV: on Valtrex . Denies prodromal symptoms  Maud Sorenson, MD [facility] Fellow Center for [facility], [facility]  06/02/2023, 7:20 AM

## 2023-06-02 NOTE — Progress Notes (Signed)
 Labor progress note:  Difficult to trace FHR and contraction w position changes secondary to body habitus which has been limiting ability to titrate Pitocin  appropriately. Discussed placement of FSE and IUPC w pt, pt provided verbal consent. FSE and IUPC placed, pt and baby tolerated well.  Melanie Spires, MD OB Fellow, Faculty Practice [facility], Center for [facility]

## 2023-06-02 NOTE — MAU Note (Signed)
 Diana Jacobs is a 33 y.o. at [redacted]w[redacted]d here in MAU reporting ctxs for an hour. Denies LOF or VB. Reports good FM. No recent sve  LMP: n/a  Onset of complaint: 1hour Pain score: 10 Vitals:   06/02/23 0348 06/02/23 0353  BP:  138/84  Pulse: 91   Resp: (!) 22   Temp: 97.8 F (36.6 C)   SpO2: 100%      FHT: 134  Lab orders placed from triage: labor eval

## 2023-06-02 NOTE — MAU Note (Signed)
 Pt informed that the ultrasound is considered a limited OB ultrasound and is not intended to be a complete ultrasound exam.  Patient also informed that the ultrasound is not being completed with the intent of assessing for fetal or placental anomalies or any pelvic abnormalities.  Explained that the purpose of today's ultrasound is to assess for  presentation.  Patient acknowledges the purpose of the exam and the limitations of the study.     Vertex verified by certified RN

## 2023-06-02 NOTE — Progress Notes (Signed)
 LABOR PROGRESS NOTE  Patient Name: Diana Jacobs, female   DOB: Feb 07, 1990, 33 y.o.  MRN: [id number]  Cat 1 strip, pt agreeable to check and ?AROM.  Cervix 3/50/-2 and asynclitic. AROM of tight membrane however no fluid noted on exam.  Pt not tolerating exam so will assess at next check.   Ebony Goldstein, MD

## 2023-06-02 NOTE — Discharge Instructions (Signed)
 Continue to check your blood pressures twice a day Call the office for blood pressures that are consistently above 140s for the top number or 90s for the bottom number   Hypertension During Pregnancy Hypertension is also called high blood pressure. High blood pressure means that the force of your blood moving in your body is too strong. It can cause problems for you and your baby. Different types of high blood pressure can happen during pregnancy. The types are: High blood pressure before you got pregnant. This is called chronic hypertension.  This can continue during your pregnancy. Your doctor will want to keep checking your blood pressure. You may need medicine to keep your blood pressure under control while you are pregnant. You will need follow-up visits after you have your baby. High blood pressure that goes up during pregnancy when it was normal before. This is called gestational hypertension. It will usually get better after you have your baby, but your doctor will need to watch your blood pressure to make sure that it is getting better. Very high blood pressure during pregnancy. This is called preeclampsia. Very high blood pressure is an emergency that needs to be checked and treated right away. You may develop very high blood pressure after giving birth. This is called postpartum preeclampsia. This usually occurs within 48 hours after childbirth but may occur up to 6 weeks after giving birth. This is rare. How does this affect me? If you have high blood pressure during pregnancy, you have a higher chance of developing high blood pressure: As you get older. If you get pregnant again. In some cases, high blood pressure during pregnancy can cause: Stroke. Heart attack. Damage to the kidneys, lungs, or liver. Preeclampsia. Jerky movements you cannot control (convulsions or seizures). Problems with the placenta.  What can I do to lower my risk?  Keep a healthy weight. Eat a healthy  diet. Follow what your doctor tells you about treating any medical problems that you had before becoming pregnant. It is very important to go to all of your doctor visits. Your doctor will check your blood pressure and make sure that your pregnancy is progressing as it should. Treatment should start early if a problem is found.  Follow these instructions at home:  Take your blood pressure 1-2 times per day. Call the office if your blood pressure is 155 or higher for the top number or 105 or higher for the bottom number.    Eating and drinking  Drink enough fluid to keep your pee (urine) pale yellow. Avoid caffeine. Lifestyle Do not use any products that contain nicotine or tobacco, such as cigarettes, e-cigarettes, and chewing tobacco. If you need help quitting, ask your doctor. Do not use alcohol or drugs. Avoid stress. Rest and get plenty of sleep. Regular exercise can help. Ask your doctor what kinds of exercise are best for you. General instructions Take over-the-counter and prescription medicines only as told by your doctor. Keep all prenatal and follow-up visits as told by your doctor. This is important. Contact a doctor if: You have symptoms that your doctor told you to watch for, such as: Headaches. Nausea. Vomiting. Belly (abdominal) pain. Dizziness. Light-headedness. Get help right away if: You have: Very bad belly pain that does not get better with treatment. A very bad headache that does not get better. Vomiting that does not get better. Sudden, fast weight gain. Sudden swelling in your hands, ankles, or face. Blood in your pee. Blurry vision. Double vision.  Shortness of breath. Chest pain. Weakness on one side of your body. Trouble talking. Summary High blood pressure is also called hypertension. High blood pressure means that the force of your blood moving in your body is too strong. High blood pressure can cause problems for you and your baby. Keep all  follow-up visits as told by your doctor. This is important. This information is not intended to replace advice given to you by your health care provider. Make sure you discuss any questions you have with your health care provider. Document Released: 01/22/2010 Document Revised: 04/12/2018 Document Reviewed: 01/16/2018 Elsevier Patient Education  2020 [employer].

## 2023-06-02 NOTE — Anesthesia Procedure Notes (Signed)
 Epidural Patient location during procedure: OB Start time: 06/02/2023 11:50 AM End time: 06/02/2023 11:57 AM  Staffing Anesthesiologist: Peggy Bowens, MD  Preanesthetic Checklist Completed: patient identified, IV checked, site marked, risks and benefits discussed, surgical consent, monitors and equipment checked, pre-op evaluation and timeout performed  Epidural Patient position: sitting Prep: DuraPrep and site prepped and draped Patient monitoring: continuous pulse ox and blood pressure Approach: midline Injection technique: LOR air  Needle:  Needle type: Tuohy  Needle gauge: 17 G Needle length: 9 cm and 9 Needle insertion depth: 9 cm Catheter type: closed end flexible Catheter size: 19 Gauge Catheter at skin depth: 16 cm Test dose: negative  Assessment Events: blood not aspirated, no cerebrospinal fluid, injection not painful, no injection resistance, no paresthesia and negative IV test  Additional Notes 25G spinal needle placed through epidural needle for dural puncture. Easy pass of cath. Negative SA and IV test doses.

## 2023-06-02 NOTE — Discharge Summary (Signed)
 Postpartum Discharge Summary  Date of Service updated     Patient Name: Diana Jacobs DOB: [birth year] MRN: [id number]  Date of admission: 06/02/2023 Delivery date:06/02/2023 Delivering provider: Maud Sorenson Date of discharge: 06/04/2023  Admitting diagnosis: Pregnancy at 38/1. Early labor.  Intrauterine pregnancy: [redacted]w[redacted]d     Secondary diagnosis:  GHTN. BMI 40s. GDM. GBS+    Discharge diagnosis: Term Pregnancy Delivered, Gestational Hypertension, and GDM A1                                              Post partum procedures: postpartum tubal ligation Augmentation: AROM and Pitocin  Complications: None  Hospital course: Onset of Labor With Vaginal Delivery      33 y.o. yo [id number] at [redacted]w[redacted]d was admitted in early labor on 06/02/2023. Labor course was complicated by nothing.  Membrane Rupture Time/Date: 1:39 PM,06/02/2023  Delivery Method:Vaginal, Spontaneous Operative Delivery:N/A Episiotomy: None Lacerations:  Labial Patient had a postpartum course was uncomplicated continued hypertension.  BP controlled with Procardia. Started on lasix/K x5 days as well. PP BTL performed. She is ambulating, tolerating a regular diet, passing flatus, and urinating well. Patient is discharged home in stable condition on 06/04/23.  Newborn Data: Birth date:06/02/2023 Birth time:9:32 PM Gender:Female Living status:Living Apgars:8 ,8  Weight:3147 g  Magnesium  Sulfate received: No BMZ received: No Rhophylac:N/A MMR:No T-DaP:Given prenatally Flu: No RSV Vaccine received: No Transfusion:No  Immunizations administered: Immunization History  Administered Date(s) Administered   Influenza,inj,Quad PF,6+ Mos 03/01/2013   PFIZER(Purple Top)SARS-COV-2 Vaccination 04/06/2019, 04/30/2019, 12/21/2019   Tdap 03/01/2013, 03/30/2023    Physical exam  Vitals:   06/03/23 1816 06/03/23 2147 06/04/23 0019 06/04/23 0531  BP: (!) 147/87 135/79 138/88 (!) 144/88  Pulse: 90 97 85 86   Resp: 18 18 16 16   Temp: 98.7 F (37.1 C) 97.8 F (36.6 C) 98.4 F (36.9 C) 98.8 F (37.1 C)  TempSrc: Oral Axillary Oral Oral  SpO2: 97% 98% 97% 97%  Weight:      Height:       General: alert, cooperative, and no distress Lochia: appropriate Uterine Fundus: firm Incision: Dressing is clean, dry, and intact DVT Evaluation: No evidence of DVT seen on physical exam. Labs: Lab Results  Component Value Date   WBC 8.2 06/03/2023   HGB 8.5 (L) 06/03/2023   HCT 27.9 (L) 06/03/2023   MCV 78.6 (L) 06/03/2023   PLT 231 06/03/2023      Latest Ref Rng & Units 06/02/2023    6:29 AM  CMP  Glucose 70 - 99 mg/dL 83   BUN 6 - 20 mg/dL 6   Creatinine 5.36 - 6.44 mg/dL 0.34   Sodium 742 - 595 mmol/L 134   Potassium 3.5 - 5.1 mmol/L 3.8   Chloride 98 - 111 mmol/L 105   CO2 22 - 32 mmol/L 22   Calcium 8.9 - 10.3 mg/dL 8.8   Total Protein 6.5 - 8.1 g/dL 6.9   Total Bilirubin 0.0 - 1.2 mg/dL 0.4   Alkaline Phos 38 - 126 U/L 145   AST 15 - 41 U/L 20   ALT 0 - 44 U/L 12    Edinburgh Score:    06/03/2023   10:02 PM  Edinburgh Postnatal Depression Scale Screening Tool  I have been able to laugh and see the funny side of things. 0  I have looked  forward with enjoyment to things. 0  I have blamed myself unnecessarily when things went wrong. 0  I have been anxious or worried for no good reason. 0  I have felt scared or panicky for no good reason. 0  Things have been getting on top of me. 0  I have been so unhappy that I have had difficulty sleeping. 0  I have felt sad or miserable. 0  I have been so unhappy that I have been crying. 0  The thought of harming myself has occurred to me. 0  Edinburgh Postnatal Depression Scale Total 0      After visit meds:  Allergies as of 06/04/2023   No Known Allergies      Medication List     STOP taking these medications    Accu-Chek Guide Test test strip Generic drug: glucose blood   Accu-Chek Guide w/Device Kit   Accu-Chek Softclix  Lancets lancets   aspirin  81 MG chewable tablet   hydrocortisone  1 % lotion   hydrOXYzine  25 MG tablet Commonly known as: ATARAX    valACYclovir  1000 MG tablet Commonly known as: Valtrex        TAKE these medications    acetaminophen  325 MG tablet Commonly known as: Tylenol  Take 2 tablets (650 mg total) by mouth every 6 (six) hours as needed (for pain scale < 4).   Blood Pressure Kit Devi 1 Device by Does not apply route once a week.   Ferrous Fumarate 324 (106 Fe) MG Tabs tablet Commonly known as: HEMOCYTE - 106 mg FE Take 1 tablet (106 mg of iron total) by mouth every other day. Start taking on: June 05, 2023   furosemide 40 MG tablet Commonly known as: LASIX Take 1 tablet (40 mg total) by mouth daily.   ibuprofen  600 MG tablet Commonly known as: ADVIL  Take 1 tablet (600 mg total) by mouth every 6 (six) hours as needed for mild pain (pain score 1-3) or moderate pain (pain score 4-6).   NIFEdipine 30 MG 24 hr tablet Commonly known as: ADALAT CC Take 1 tablet (30 mg total) by mouth daily.   potassium chloride SA 20 MEQ tablet Commonly known as: KLOR-CON M Take 2 tablets (40 mEq total) by mouth daily.   PrePLUS 27-1 MG Tabs Take 1 tablet by mouth daily.   senna-docusate 8.6-50 MG tablet Commonly known as: Senokot-S Take 2 tablets by mouth at bedtime as needed for mild constipation.         Discharge home in stable condition Infant Feeding: Breast Infant Disposition:home with mother Discharge instruction: per After Visit Summary and Postpartum booklet. Activity: Advance as tolerated. Pelvic rest for 6 weeks.  Diet: routine diet  Anticipated Birth Control: BTL done PP Postpartum Appointment:6 weeks Additional Postpartum F/U: BP check 1 week Future Appointments:No future appointments. Follow up Visit:  Follow-up Information     [facility]. Go in 1 month(s).   Why: routine after baby visit call the office late next week if you haven't heard  about this appointment Contact information: [address] [place]  [address] [phone]                    06/04/2023 Maud Sorenson, MD

## 2023-06-02 NOTE — Anesthesia Preprocedure Evaluation (Signed)
 Anesthesia Evaluation  Patient identified by MRN, date of birth, ID band Patient awake    Reviewed: Allergy & Precautions, Patient's Chart, lab work & pertinent test results  Airway Mallampati: III  TM Distance: >3 FB Neck ROM: Full    Dental   Pulmonary neg pulmonary ROS   Pulmonary exam normal        Cardiovascular hypertension, Normal cardiovascular exam     Neuro/Psych negative neurological ROS     GI/Hepatic negative GI ROS, Neg liver ROS,,,  Endo/Other  diabetes, Gestational  Class 3 obesity  Renal/GU negative Renal ROS     Musculoskeletal   Abdominal   Peds  Hematology  (+) Blood dyscrasia, anemia   Anesthesia Other Findings   Reproductive/Obstetrics (+) Pregnancy                             Anesthesia Physical Anesthesia Plan  ASA: 3  Anesthesia Plan: Epidural   Post-op Pain Management:    Induction:   PONV Risk Score and Plan: 2 and Treatment may vary due to age or medical condition  Airway Management Planned:   Additional Equipment:   Intra-op Plan:   Post-operative Plan:   Informed Consent: I have reviewed the patients History and Physical, chart, labs and discussed the procedure including the risks, benefits and alternatives for the proposed anesthesia with the patient or authorized representative who has indicated his/her understanding and acceptance.       Plan Discussed with:   Anesthesia Plan Comments:        Anesthesia Quick Evaluation

## 2023-06-03 ENCOUNTER — Encounter (HOSPITAL_COMMUNITY)
Admission: AD | Disposition: A | Discharge status: Home / Self Care | Payer: Self-pay | Source: Home / Self Care | Attending: Obstetrics & Gynecology

## 2023-06-03 ENCOUNTER — Inpatient Hospital Stay (HOSPITAL_COMMUNITY): Admitting: Anesthesiology

## 2023-06-03 ENCOUNTER — Encounter (HOSPITAL_COMMUNITY): Payer: Self-pay | Admitting: Obstetrics & Gynecology

## 2023-06-03 DIAGNOSIS — I10 Essential (primary) hypertension: Secondary | ICD-10-CM | POA: Diagnosis not present

## 2023-06-03 DIAGNOSIS — E119 Type 2 diabetes mellitus without complications: Secondary | ICD-10-CM | POA: Diagnosis not present

## 2023-06-03 DIAGNOSIS — Z302 Encounter for sterilization: Secondary | ICD-10-CM

## 2023-06-03 HISTORY — PX: TUBAL LIGATION: SHX77

## 2023-06-03 LAB — CBC
HCT: 27.9 % — ABNORMAL LOW (ref 36.0–46.0)
Hemoglobin: 8.5 g/dL — ABNORMAL LOW (ref 12.0–15.0)
MCH: 23.9 pg — ABNORMAL LOW (ref 26.0–34.0)
MCHC: 30.5 g/dL (ref 30.0–36.0)
MCV: 78.6 fL — ABNORMAL LOW (ref 80.0–100.0)
Platelets: 231 10*3/uL (ref 150–400)
RBC: 3.55 MIL/uL — ABNORMAL LOW (ref 3.87–5.11)
RDW: 16 % — ABNORMAL HIGH (ref 11.5–15.5)
WBC: 8.2 10*3/uL (ref 4.0–10.5)
nRBC: 0.2 % (ref 0.0–0.2)

## 2023-06-03 LAB — GLUCOSE, CAPILLARY: Glucose-Capillary: 71 mg/dL (ref 70–99)

## 2023-06-03 SURGERY — LIGATION, FALLOPIAN TUBE, POSTPARTUM
Anesthesia: Epidural | Laterality: Bilateral

## 2023-06-03 MED ORDER — BUPIVACAINE HCL (PF) 0.25 % IJ SOLN
INTRAMUSCULAR | Status: AC
  Filled 2023-06-03: qty 30

## 2023-06-03 MED ORDER — MIDAZOLAM HCL 2 MG/2ML IJ SOLN
INTRAMUSCULAR | Status: AC
  Filled 2023-06-03: qty 2

## 2023-06-03 MED ORDER — FUROSEMIDE 20 MG PO TABS
20.0000 mg | ORAL_TABLET | Freq: Every day | ORAL | Status: DC
  Administered 2023-06-03: 20 mg via ORAL
  Filled 2023-06-03: qty 1

## 2023-06-03 MED ORDER — SENNOSIDES-DOCUSATE SODIUM 8.6-50 MG PO TABS: 2.0000 | ORAL_TABLET | Freq: Every evening | ORAL | Status: DC | PRN

## 2023-06-03 MED ORDER — FENTANYL CITRATE (PF) 100 MCG/2ML IJ SOLN
INTRAMUSCULAR | Status: AC
  Filled 2023-06-03: qty 2

## 2023-06-03 MED ORDER — PROPOFOL 10 MG/ML IV BOLUS
INTRAVENOUS | Status: DC | PRN
  Administered 2023-06-03: 200 mg via INTRAVENOUS

## 2023-06-03 MED ORDER — FENTANYL CITRATE (PF) 100 MCG/2ML IJ SOLN
INTRAMUSCULAR | Status: DC | PRN
  Administered 2023-06-03: 100 ug via EPIDURAL

## 2023-06-03 MED ORDER — BUPIVACAINE HCL (PF) 0.25 % IJ SOLN
INTRAMUSCULAR | Status: DC | PRN
Start: 2023-06-03 — End: 2023-06-03
  Administered 2023-06-03: 30 mL

## 2023-06-03 MED ORDER — IBUPROFEN 600 MG PO TABS
600.0000 mg | ORAL_TABLET | Freq: Four times a day (QID) | ORAL | Status: DC
  Administered 2023-06-03 – 2023-06-04 (×5): 600 mg via ORAL
  Filled 2023-06-03 (×5): qty 1

## 2023-06-03 MED ORDER — SUCCINYLCHOLINE CHLORIDE 200 MG/10ML IV SOSY
PREFILLED_SYRINGE | INTRAVENOUS | Status: DC | PRN
  Administered 2023-06-03: 140 mg via INTRAVENOUS

## 2023-06-03 MED ORDER — DEXMEDETOMIDINE HCL IN NACL 80 MCG/20ML IV SOLN
INTRAVENOUS | Status: DC | PRN
Start: 2023-06-03 — End: 2023-06-03
  Administered 2023-06-03: 12 ug via INTRAVENOUS

## 2023-06-03 MED ORDER — COCONUT OIL OIL: 1.0000 | TOPICAL_OIL | Status: DC | PRN

## 2023-06-03 MED ORDER — FENTANYL CITRATE (PF) 100 MCG/2ML IJ SOLN: 25.0000 ug | INTRAMUSCULAR | Status: DC | PRN

## 2023-06-03 MED ORDER — LIDOCAINE 2% (20 MG/ML) 5 ML SYRINGE
INTRAMUSCULAR | Status: AC
Start: 2023-06-03 — End: ?
  Filled 2023-06-03: qty 5

## 2023-06-03 MED ORDER — DIBUCAINE (PERIANAL) 1 % EX OINT: 1.0000 | TOPICAL_OINTMENT | CUTANEOUS | Status: DC | PRN

## 2023-06-03 MED ORDER — ONDANSETRON HCL 4 MG PO TABS: 4.0000 mg | ORAL_TABLET | ORAL | Status: DC | PRN

## 2023-06-03 MED ORDER — POTASSIUM CHLORIDE CRYS ER 20 MEQ PO TBCR
40.0000 meq | EXTENDED_RELEASE_TABLET | Freq: Every day | ORAL | Status: DC
  Administered 2023-06-04: 40 meq via ORAL
  Filled 2023-06-03: qty 2

## 2023-06-03 MED ORDER — FERROUS FUMARATE 324 (106 FE) MG PO TABS
1.0000 | ORAL_TABLET | ORAL | Status: DC
  Administered 2023-06-03: 106 mg via ORAL
  Filled 2023-06-03: qty 1

## 2023-06-03 MED ORDER — SODIUM CHLORIDE 0.9 % IV SOLN: 12.5000 mg | INTRAVENOUS | Status: DC | PRN

## 2023-06-03 MED ORDER — FENTANYL CITRATE (PF) 250 MCG/5ML IJ SOLN
INTRAMUSCULAR | Status: DC | PRN
  Administered 2023-06-03: 100 ug via INTRAVENOUS

## 2023-06-03 MED ORDER — FAMOTIDINE 20 MG PO TABS
40.0000 mg | ORAL_TABLET | Freq: Once | ORAL | Status: AC
  Administered 2023-06-03: 40 mg via ORAL
  Filled 2023-06-03: qty 2

## 2023-06-03 MED ORDER — NIFEDIPINE ER OSMOTIC RELEASE 30 MG PO TB24
30.0000 mg | ORAL_TABLET | Freq: Every day | ORAL | Status: DC
  Administered 2023-06-03 – 2023-06-04 (×2): 30 mg via ORAL
  Filled 2023-06-03 (×2): qty 1

## 2023-06-03 MED ORDER — FENTANYL CITRATE (PF) 250 MCG/5ML IJ SOLN
INTRAMUSCULAR | Status: AC
  Filled 2023-06-03: qty 5

## 2023-06-03 MED ORDER — DEXAMETHASONE SODIUM PHOSPHATE 10 MG/ML IJ SOLN
INTRAMUSCULAR | Status: AC
  Filled 2023-06-03: qty 1

## 2023-06-03 MED ORDER — LIDOCAINE-EPINEPHRINE (PF) 2 %-1:200000 IJ SOLN
INTRAMUSCULAR | Status: DC | PRN
  Administered 2023-06-03 (×2): 5 mL via EPIDURAL
  Administered 2023-06-03: 7 mL via EPIDURAL
  Administered 2023-06-03: 3 mL via EPIDURAL

## 2023-06-03 MED ORDER — DEXMEDETOMIDINE HCL IN NACL 80 MCG/20ML IV SOLN
INTRAVENOUS | Status: AC
  Filled 2023-06-03: qty 20

## 2023-06-03 MED ORDER — PRENATAL MULTIVITAMIN CH: 1.0000 | ORAL_TABLET | Freq: Every day | ORAL | Status: DC

## 2023-06-03 MED ORDER — OXYCODONE HCL 5 MG PO TABS: 5.0000 mg | ORAL_TABLET | Freq: Three times a day (TID) | ORAL | Status: DC | PRN

## 2023-06-03 MED ORDER — POTASSIUM CHLORIDE CRYS ER 20 MEQ PO TBCR: 20.0000 meq | EXTENDED_RELEASE_TABLET | Freq: Every day | ORAL | Status: DC

## 2023-06-03 MED ORDER — FUROSEMIDE 20 MG PO TABS
40.0000 mg | ORAL_TABLET | Freq: Every day | ORAL | Status: DC
  Administered 2023-06-04: 40 mg via ORAL
  Filled 2023-06-03: qty 2

## 2023-06-03 MED ORDER — LIDOCAINE-EPINEPHRINE (PF) 2 %-1:200000 IJ SOLN
INTRAMUSCULAR | Status: AC
  Filled 2023-06-03: qty 20

## 2023-06-03 MED ORDER — PROPOFOL 10 MG/ML IV BOLUS
INTRAVENOUS | Status: AC
  Filled 2023-06-03: qty 40

## 2023-06-03 MED ORDER — DIPHENHYDRAMINE HCL 25 MG PO CAPS
25.0000 mg | ORAL_CAPSULE | Freq: Four times a day (QID) | ORAL | Status: DC | PRN
Start: 2023-06-03 — End: 2023-06-04

## 2023-06-03 MED ORDER — METOCLOPRAMIDE HCL 10 MG PO TABS
10.0000 mg | ORAL_TABLET | Freq: Once | ORAL | Status: AC
  Administered 2023-06-03: 10 mg via ORAL
  Filled 2023-06-03: qty 1

## 2023-06-03 MED ORDER — SUCCINYLCHOLINE CHLORIDE 200 MG/10ML IV SOSY
PREFILLED_SYRINGE | INTRAVENOUS | Status: AC
Start: 2023-06-03 — End: ?
  Filled 2023-06-03: qty 10

## 2023-06-03 MED ORDER — LIDOCAINE HCL (CARDIAC) PF 100 MG/5ML IV SOSY
PREFILLED_SYRINGE | INTRAVENOUS | Status: DC | PRN
  Administered 2023-06-03: 40 mg via INTRAVENOUS

## 2023-06-03 MED ORDER — WITCH HAZEL-GLYCERIN EX PADS
1.0000 | MEDICATED_PAD | CUTANEOUS | Status: DC | PRN
Start: 2023-06-03 — End: 2023-06-04

## 2023-06-03 MED ORDER — ACETAMINOPHEN 325 MG PO TABS
650.0000 mg | ORAL_TABLET | ORAL | Status: DC | PRN
  Administered 2023-06-03 – 2023-06-04 (×2): 650 mg via ORAL
  Filled 2023-06-03 (×2): qty 2

## 2023-06-03 MED ORDER — MIDAZOLAM HCL 5 MG/5ML IJ SOLN
INTRAMUSCULAR | Status: DC | PRN
  Administered 2023-06-03: 2 mg via INTRAVENOUS

## 2023-06-03 MED ORDER — SIMETHICONE 80 MG PO CHEW
80.0000 mg | CHEWABLE_TABLET | ORAL | Status: DC | PRN
Start: 2023-06-03 — End: 2023-06-04

## 2023-06-03 MED ORDER — LACTATED RINGERS IV SOLN: INTRAVENOUS | Status: DC

## 2023-06-03 MED ORDER — TETANUS-DIPHTH-ACELL PERTUSSIS 5-2.5-18.5 LF-MCG/0.5 IM SUSY: 0.5000 mL | PREFILLED_SYRINGE | Freq: Once | INTRAMUSCULAR | Status: DC

## 2023-06-03 MED ORDER — OXYCODONE HCL 5 MG PO TABS
5.0000 mg | ORAL_TABLET | Freq: Four times a day (QID) | ORAL | Status: DC | PRN
  Administered 2023-06-03: 5 mg via ORAL
  Administered 2023-06-04 (×2): 10 mg via ORAL
  Filled 2023-06-03: qty 2
  Filled 2023-06-03: qty 1
  Filled 2023-06-03: qty 2

## 2023-06-03 MED ORDER — ONDANSETRON HCL 4 MG/2ML IJ SOLN: 4.0000 mg | INTRAMUSCULAR | Status: DC | PRN

## 2023-06-03 MED ORDER — BENZOCAINE-MENTHOL 20-0.5 % EX AERO
1.0000 | INHALATION_SPRAY | CUTANEOUS | Status: DC | PRN
  Administered 2023-06-03: 1 via TOPICAL
  Filled 2023-06-03: qty 56

## 2023-06-03 MED ORDER — ONDANSETRON HCL 4 MG/2ML IJ SOLN
INTRAMUSCULAR | Status: AC
  Filled 2023-06-03: qty 2

## 2023-06-03 MED ORDER — DEXAMETHASONE SODIUM PHOSPHATE 4 MG/ML IJ SOLN
INTRAMUSCULAR | Status: DC | PRN
Start: 2023-06-03 — End: 2023-06-03
  Administered 2023-06-03: 8 mg via INTRAVENOUS

## 2023-06-03 MED ORDER — LIDOCAINE HCL (CARDIAC) PF 100 MG/5ML IV SOSY: PREFILLED_SYRINGE | INTRAVENOUS | Status: DC | PRN

## 2023-06-03 MED ORDER — ONDANSETRON HCL 4 MG/2ML IJ SOLN
INTRAMUSCULAR | Status: DC | PRN
  Administered 2023-06-03: 4 mg via INTRAVENOUS

## 2023-06-03 SURGICAL SUPPLY — 22 items
BLADE SURG 15 STRL LF C SS BP (BLADE) ×2 IMPLANT
CLOTH BEACON ORANGE TIMEOUT ST (SAFETY) ×2 IMPLANT
DERMABOND ADVANCED .7 DNX12 (GAUZE/BANDAGES/DRESSINGS) ×2 IMPLANT
DISSECTOR SURG LIGASURE 21 (MISCELLANEOUS) IMPLANT
DRSG OPSITE POSTOP 3X4 (GAUZE/BANDAGES/DRESSINGS) ×2 IMPLANT
ELECTRODE REM PT RTRN 9FT ADLT (ELECTROSURGICAL) IMPLANT
GLOVE BIOGEL PI IND STRL 7.0 (GLOVE) ×2 IMPLANT
GLOVE BIOGEL PI IND STRL 8 (GLOVE) ×2 IMPLANT
GLOVE ECLIPSE 8.0 STRL XLNG CF (GLOVE) ×2 IMPLANT
GOWN STRL REUS W/TWL LRG LVL3 (GOWN DISPOSABLE) ×4 IMPLANT
NEEDLE HYPO 22GX1.5 SAFETY (NEEDLE) IMPLANT
NS IRRIG 1000ML POUR BTL (IV SOLUTION) ×2 IMPLANT
PACK ABDOMINAL MINOR (CUSTOM PROCEDURE TRAY) ×2 IMPLANT
PENCIL BUTTON HOLSTER BLD 10FT (ELECTRODE) IMPLANT
PROTECTOR NERVE ULNAR (MISCELLANEOUS) ×2 IMPLANT
SPONGE LAP 4X18 RFD (DISPOSABLE) IMPLANT
SUT PLAIN ABS 2-0 CT1 27XMFL (SUTURE) ×4 IMPLANT
SUT VIC AB 0 CT1 27XBRD ANBCTR (SUTURE) ×2 IMPLANT
SUT VIC AB 4-0 KS 27 (SUTURE) ×2 IMPLANT
SYR CONTROL 10ML LL (SYRINGE) IMPLANT
TOWEL OR 17X24 6PK STRL BLUE (TOWEL DISPOSABLE) ×4 IMPLANT
TRAY FOLEY CATH SILVER 14FR (SET/KITS/TRAYS/PACK) ×2 IMPLANT

## 2023-06-03 NOTE — Progress Notes (Signed)
 Patient ID: Diana Jacobs, female   DOB: 04-03-1990, 33 y.o.   MRN: [id number] I have consented the patient for bilateral salpingectomy if possible, but bilateral tubal ligation certainly.  She understands the decrease risk of ovarian cancer with salpingectomy and the limitations of always being able to perform one.  Wendelyn Halter, MD 06/03/2023 2:07 PM

## 2023-06-03 NOTE — Transfer of Care (Signed)
 Immediate Anesthesia Transfer of Care Note  Patient: Diana Jacobs  Procedure(s) Performed: LIGATION, FALLOPIAN TUBE, POSTPARTUM (Bilateral)  Patient Location: PACU  Anesthesia Type:General  Level of Consciousness: awake, alert , and oriented  Airway & Oxygen Therapy: Patient Spontanous Breathing and Patient connected to nasal cannula oxygen  Post-op Assessment: Report given to RN and Post -op Vital signs reviewed and stable  Post vital signs: Reviewed and stable  Last Vitals:  Vitals Value Taken Time  BP 127/76 06/03/23 1615  Temp    Pulse 85 06/03/23 1616  Resp 19 06/03/23 1616  SpO2 97 % 06/03/23 1616  Vitals shown include unfiled device data.  Last Pain:  Vitals:   06/03/23 1036  TempSrc: Oral  PainSc:       Patients Stated Pain Goal: 3 (06/03/23 0900)  Complications: No notable events documented.

## 2023-06-03 NOTE — Anesthesia Preprocedure Evaluation (Signed)
 Anesthesia Evaluation  Patient identified by MRN, date of birth, ID band Patient awake    Reviewed: Allergy & Precautions, NPO status , Patient's Chart, lab work & pertinent test results  Airway Mallampati: III  TM Distance: >3 FB Neck ROM: Full    Dental   Pulmonary neg pulmonary ROS   Pulmonary exam normal        Cardiovascular hypertension (GHTN), Normal cardiovascular exam     Neuro/Psych negative neurological ROS     GI/Hepatic negative GI ROS, Neg liver ROS,,,  Endo/Other  diabetes, Gestational  Class 3 obesity  Renal/GU negative Renal ROS     Musculoskeletal   Abdominal   Peds  Hematology  (+) Blood dyscrasia, anemia   Anesthesia Other Findings   Reproductive/Obstetrics (+) Pregnancy                             Anesthesia Physical Anesthesia Plan  ASA: 3  Anesthesia Plan: Epidural   Post-op Pain Management:    Induction:   PONV Risk Score and Plan: 2 and Treatment may vary due to age or medical condition  Airway Management Planned: Natural Airway and Simple Face Mask  Additional Equipment:   Intra-op Plan:   Post-operative Plan:   Informed Consent: I have reviewed the patients History and Physical, chart, labs and discussed the procedure including the risks, benefits and alternatives for the proposed anesthesia with the patient or authorized representative who has indicated his/her understanding and acceptance.       Plan Discussed with:   Anesthesia Plan Comments:        Anesthesia Quick Evaluation

## 2023-06-03 NOTE — Op Note (Signed)
 Diana Jacobs  06/03/2023  PREOPERATIVE DIAGNOSIS:  Multiparity, undesired fertility  POSTOPERATIVE DIAGNOSIS:  Multiparity, undesired fertility  PROCEDURE:  Postpartum Bilateral Tubal Salpingectomy   ANESTHESIA:  General anesthesia and local analgesia using 0.25% Marcaine   COMPLICATIONS:  None immediate.  ESTIMATED BLOOD LOSS: 21 ml.  TOTAL INTRAVENOUS FLUIDS: 1700 ml.   URINE OUTPUT: 500 ml.  INDICATIONS: 33 y.o. [id number]  with undesired fertility,status post vaginal delivery, desires permanent sterilization.  Other reversible forms of contraception were discussed with patient; she declines all other modalities. Risks of procedure discussed with patient including but not limited to: risk of regret, permanence of method, bleeding, infection, injury to surrounding organs and need for additional procedures.  Failure risk of 0.5-1% with increased risk of ectopic gestation if pregnancy occurs was also discussed with patient.     FINDINGS:  Normal uterus, tubes, and ovaries.  PROCEDURE DETAILS: The patient was taken to the operating room where she was placed under general anesthesia given discomfort despite epidural dosed at spinal level. She was then placed in a supine position and prepped and draped in the usual sterile fashion.  After an adequate timeout was performed, attention was turned to the patient's abdomen where a small transverse skin incision was made under the umbilical fold. The incision was taken down to the layer of fascia using the scalpel, and fascia was incised, and extended bilaterally. The peritoneum was entered in a sharp fashion. The patient was placed in Trendelenburg.  A moist lap pad was used to move omentum and bowel away until the left fallopian tube was identified and grasped with a Babcock clamp, and followed out to the fimbriated end. The right fallopian tube was identified and followed out to the fimbriated end.  Ligasure device was used to cauterize  and cut the mesosalpinx to proximal end of the fallopian tube, removing 6cm of tube. A similar process was carried out on the left side allowing for bilateral tubal sterilization. Good hemostasis was noted overall. The instruments were then removed from the patient's abdomen and the fascial incision was repaired with 0 Vicryl. The subcutaneous tissue was then closed with 2-0 Plain gut. The skin was closed with a 4-0 Vicryl subcuticular stitch. The patient tolerated the procedure well.  Sponge, lap, and needle counts were correct times two.  The patient was then taken to the recovery room awake, extubated and in stable condition.  Melanie Spires MD 06/03/2023 5:54 PM

## 2023-06-03 NOTE — Anesthesia Postprocedure Evaluation (Signed)
 Anesthesia Post Note  Patient: Diana Jacobs  Procedure(s) Performed: AN AD HOC LABOR EPIDURAL     Patient location during evaluation: Mother Baby Anesthesia Type: Epidural Level of consciousness: awake and alert and oriented Pain management: satisfactory to patient Vital Signs Assessment: post-procedure vital signs reviewed and stable Respiratory status: respiratory function stable Cardiovascular status: stable Postop Assessment: no headache, no backache, epidural receding, patient able to bend at knees, no signs of nausea or vomiting, adequate PO intake and able to ambulate Anesthetic complications: no   No notable events documented.  Last Vitals:  Vitals:   06/03/23 0325 06/03/23 0603  BP: 130/82 127/72  Pulse: 88 86  Resp: 17 17  Temp: 36.9 C 36.7 C  SpO2:      Last Pain:  Vitals:   06/03/23 0730  TempSrc:   PainSc: Asleep   Pain Goal:                   Jamond Neels

## 2023-06-03 NOTE — Anesthesia Postprocedure Evaluation (Signed)
 Anesthesia Post Note  Patient: Diana Jacobs  Procedure(s) Performed: LIGATION, FALLOPIAN TUBE, POSTPARTUM (Bilateral)     Patient location during evaluation: PACU Anesthesia Type: General Level of consciousness: awake and alert Pain management: pain level controlled Vital Signs Assessment: post-procedure vital signs reviewed and stable Respiratory status: spontaneous breathing, nonlabored ventilation, respiratory function stable and patient connected to nasal cannula oxygen Cardiovascular status: blood pressure returned to baseline and stable Postop Assessment: no apparent nausea or vomiting Anesthetic complications: no  No notable events documented.  Last Vitals:  Vitals:   06/03/23 1724 06/03/23 1816  BP: (!) 146/95 (!) 147/87  Pulse: 87 90  Resp: 15 18  Temp: 36.7 C 37.1 C  SpO2: 96% 97%    Last Pain:  Vitals:   06/03/23 1821  TempSrc:   PainSc: 10-Worst pain ever   Pain Goal: Patients Stated Pain Goal: 2 (06/03/23 1821)                 Melvenia Stabs

## 2023-06-03 NOTE — Lactation Note (Signed)
 This note was copied from a baby's chart. Lactation Consultation Note  Patient Name: Diana Jacobs [id number] Date: 06/03/2023 Age:35 hours  MOB decided to exclusively formula feed infant.    Maternal Data    Feeding    LATCH Score                    Lactation Tools Discussed/Used    Interventions    Discharge    Consult Status      [name] 06/03/2023, 2:21 AM

## 2023-06-03 NOTE — Anesthesia Procedure Notes (Signed)
 Procedure Name: Intubation Date/Time: 06/03/2023 2:55 PM  Performed by: Johna Myers, CRNAPre-anesthesia Checklist: Patient identified, Patient being monitored, Timeout performed, Emergency Drugs available and Suction available Patient Re-evaluated:Patient Re-evaluated prior to induction Oxygen Delivery Method: Circle System Utilized Preoxygenation: Pre-oxygenation with 100% oxygen Induction Type: IV induction and Rapid sequence Laryngoscope Size: Mac, 3 and Glidescope Grade View: Grade I Tube type: Oral Tube size: 7.0 mm Number of attempts: 1 Airway Equipment and Method: stylet and Video-laryngoscopy Placement Confirmation: ETT inserted through vocal cords under direct vision, positive ETCO2 and breath sounds checked- equal and bilateral Secured at: 23 cm Dental Injury: Teeth and Oropharynx as per pre-operative assessment

## 2023-06-04 LAB — BIRTH TISSUE RECOVERY COLLECTION (PLACENTA DONATION)

## 2023-06-04 MED ORDER — FERROUS FUMARATE 324 (106 FE) MG PO TABS: 1.0000 | ORAL_TABLET | ORAL | 0 refills | Status: AC

## 2023-06-04 MED ORDER — ACETAMINOPHEN 325 MG PO TABS: 650.0000 mg | ORAL_TABLET | Freq: Four times a day (QID) | ORAL | 0 refills | Status: AC | PRN

## 2023-06-04 MED ORDER — NIFEDIPINE ER 30 MG PO TB24
30.0000 mg | ORAL_TABLET | Freq: Every day | ORAL | 0 refills | Status: AC
Start: 2023-06-04 — End: ?

## 2023-06-04 MED ORDER — FUROSEMIDE 40 MG PO TABS
40.0000 mg | ORAL_TABLET | Freq: Every day | ORAL | 0 refills | Status: AC
Start: 2023-06-04 — End: ?

## 2023-06-04 MED ORDER — IBUPROFEN 600 MG PO TABS: 600.0000 mg | ORAL_TABLET | Freq: Four times a day (QID) | ORAL | 0 refills | Status: AC | PRN

## 2023-06-04 MED ORDER — POTASSIUM CHLORIDE CRYS ER 20 MEQ PO TBCR: 40.0000 meq | EXTENDED_RELEASE_TABLET | Freq: Every day | ORAL | 0 refills | Status: AC

## 2023-06-04 MED ORDER — SENNOSIDES-DOCUSATE SODIUM 8.6-50 MG PO TABS: 2.0000 | ORAL_TABLET | Freq: Every evening | ORAL | 0 refills | Status: AC | PRN

## 2023-06-05 ENCOUNTER — Encounter (HOSPITAL_COMMUNITY): Payer: Self-pay | Admitting: Obstetrics & Gynecology

## 2023-06-06 LAB — SURGICAL PATHOLOGY

## 2023-06-08 ENCOUNTER — Other Ambulatory Visit

## 2023-06-08 ENCOUNTER — Inpatient Hospital Stay (HOSPITAL_COMMUNITY)

## 2023-06-08 ENCOUNTER — Inpatient Hospital Stay (HOSPITAL_COMMUNITY): Admission: RE | Admit: 2023-06-08 | Source: Home / Self Care | Admitting: Family Medicine

## 2023-06-08 ENCOUNTER — Ambulatory Visit

## 2023-06-12 ENCOUNTER — Telehealth (HOSPITAL_COMMUNITY): Payer: Self-pay | Admitting: *Deleted

## 2023-06-12 NOTE — Telephone Encounter (Signed)
 06/12/2023  Name: Diana Jacobs [facility] MRN: [id number] DOB: 1990-05-05  Reason for Call:  Transition of Care Hospital Discharge Call  Contact Status: Patient Contact Status: Complete  Language assistant needed: Interpreter Mode: Interpreter Not Needed        Follow-Up Questions: Do You Have Any Concerns About Your Health As You Heal From Delivery?: No Do You Have Any Concerns About Your Infants Health?: No  Edinburgh Postnatal Depression Scale:  In the Past 7 Days:    PHQ2-9 Depression Scale:     Discharge Follow-up: Edinburgh score requires follow up?:  (declines screening today, endorses she is doing well emotionally) Patient was advised of the following resources:: Support Group, Breastfeeding Support Group  Post-discharge interventions: Reviewed Newborn Safe Sleep Practices  Pearlie Bougie, RN 06/12/2023 15:55

## 2023-07-03 ENCOUNTER — Ambulatory Visit: Admitting: Obstetrics

## 2023-07-03 ENCOUNTER — Encounter: Payer: Self-pay | Admitting: Obstetrics

## 2023-07-03 DIAGNOSIS — E66813 Obesity, class 3: Secondary | ICD-10-CM

## 2023-07-03 DIAGNOSIS — Z6841 Body Mass Index (BMI) 40.0 and over, adult: Secondary | ICD-10-CM | POA: Diagnosis not present

## 2023-07-03 NOTE — Progress Notes (Signed)
 Post Partum Visit Note  Diana Jacobs is a 33 y.o. [phone] female who presents for a postpartum visit. She is 4 weeks postpartum following a normal spontaneous vaginal delivery.  I have fully reviewed the prenatal and intrapartum course. The delivery was at 38 gestational weeks.  Anesthesia: epidural. Postpartum course has been good. Baby is doing well. Baby is feeding by bottle - Similac Sensitive RS. Bleeding no bleeding. Bowel function is normal. Bladder function is normal. Patient is not sexually active. Contraception method is tubal ligation. Postpartum depression screening: negative.   The pregnancy intention screening data noted above was reviewed. Potential methods of contraception were discussed. The patient elected to proceed with No data recorded.   Edinburgh Postnatal Depression Scale - 07/03/23 0848       Edinburgh Postnatal Depression Scale:  In the Past 7 Days   I have been able to laugh and see the funny side of things. 0    I have looked forward with enjoyment to things. 0    I have blamed myself unnecessarily when things went wrong. 0    I have been anxious or worried for no good reason. 0    I have felt scared or panicky for no good reason. 0    Things have been getting on top of me. 0    I have been so unhappy that I have had difficulty sleeping. 0    I have felt sad or miserable. 1    I have been so unhappy that I have been crying. 0    The thought of harming myself has occurred to me. 0    Edinburgh Postnatal Depression Scale Total 1          Health Maintenance Due  Topic Date Due   Hepatitis B Vaccines (1 of 3 - 19+ 3-dose series) Never done   HPV VACCINES (1 - 3-dose SCDM series) Never done   COVID-19 Vaccine (4 - 2024-25 season) 09/04/2022    The following portions of the patient's history were reviewed and updated as appropriate: allergies, current medications, past family history, past medical history, past social history, past surgical  history, and problem list.  Review of Systems A comprehensive review of systems was negative.  Objective:  BP (!) 140/90   Pulse 79   Ht 5' 4 (1.626 m)   Wt 266 lb 8 oz (120.9 kg)   LMP 09/02/2022   Breastfeeding No   BMI 45.74 kg/m    General:  alert and no distress   Breasts:  normal  Lungs: clear to auscultation bilaterally  Heart:  regular rate and rhythm, S1, S2 normal, no murmur, click, rub or gallop  Abdomen: soft, non-tender; bowel sounds normal; no masses,  no organomegaly   Wound : none  GU exam:  not indicated       Assessment:   1. Postpartum care following vaginal delivery (Primary) - doing well  2. Class 3 severe obesity due to excess calories without serious comorbidity with body mass index (BMI) of 40.0 to 44.9 in adult    Plan:   Essential components of care per ACOG recommendations:  1.  Mood and well being: Patient with negative depression screening today. Reviewed local resources for support.  - Patient tobacco use? No.   - hx of drug use? No.    2. Infant care and feeding:  -Patient currently breastmilk feeding? No.  -Social determinants of health (SDOH) reviewed in EPIC. No concerns.  3. Sexuality, contraception  and birth spacing - Patient does not want a pregnancy in the next year.  Desired family size is 3 children.  - Reviewed reproductive life planning. Reviewed contraceptive methods based on pt preferences and effectiveness.  Patient desired Female Sterilization today.   - Discussed birth spacing of 18 months  4. Sleep and fatigue -Encouraged family/partner/community support of 4 hrs of uninterrupted sleep to help with mood and fatigue  5. Physical Recovery  - Discussed patients delivery and complications. She describes her labor as good. - Patient had a Vaginal, no problems at delivery. Patient had no laceration. Perineal healing reviewed. Patient expressed understanding - Patient has urinary incontinence? No. - Patient is safe to  resume physical and sexual activity  6.  Health Maintenance - HM due items addressed Yes - Last pap smear  Diagnosis  Date Value Ref Range Status  05/25/2021   Final   - Negative for Intraepithelial Lesions or Malignancy (NILM)  05/25/2021 - Benign reactive/reparative changes  Final   Pap smear not done at today's visit.  -Breast Cancer screening indicated? No.   7. Chronic Disease/Pregnancy Condition follow up: None   [facility], MD [facility], [facility], [place] 07/03/2023

## 2023-08-14 ENCOUNTER — Other Ambulatory Visit

## 2023-08-14 ENCOUNTER — Ambulatory Visit: Admitting: Obstetrics

## 2023-08-14 DIAGNOSIS — E66813 Obesity, class 3: Secondary | ICD-10-CM

## 2023-08-14 DIAGNOSIS — O99815 Abnormal glucose complicating the puerperium: Secondary | ICD-10-CM

## 2023-08-14 DIAGNOSIS — Z6841 Body Mass Index (BMI) 40.0 and over, adult: Secondary | ICD-10-CM

## 2023-08-14 DIAGNOSIS — Z920 Personal history of contraception: Secondary | ICD-10-CM

## 2023-08-14 MED ORDER — LORATADINE 10 MG PO TABS: 10.0000 mg | ORAL_TABLET | Freq: Every day | ORAL | 11 refills | Status: DC

## 2023-08-14 NOTE — Progress Notes (Signed)
 Subjective:     Diana Jacobs is a 33 y.o. female who presents for a postpartum visit. She is 10 weeks postpartum following a spontaneous vaginal delivery. I have fully reviewed the prenatal and intrapartum course. The delivery was at 38 gestational weeks. Outcome: spontaneous vaginal delivery and bilateral salpingectomy. Anesthesia: epidural. Postpartum course has been normal. Baby's course has been normal. Baby is feeding by bottle. Bleeding no bleeding. Bowel function is normal. Bladder function is normal. Patient is sexually active. Contraception method is bilateral salpingectomy. Postpartum depression screening: negative.  Tobacco, alcohol and substance abuse history reviewed.  Adult immunizations reviewed including TDAP, rubella and varicella.  The following portions of the patient's history were reviewed and updated as appropriate: allergies, current medications, past family history, past medical history, past social history, past surgical history, and problem list.  Review of Systems A comprehensive review of systems was negative.   Objective:    BP (!) 130/92   Pulse 77   Ht 5' 4 (1.626 m)   Wt 271 lb (122.9 kg)   LMP 08/12/2023 (Approximate)   Breastfeeding No   BMI 46.52 kg/m   General:  alert and no distress   Breasts:  inspection negative, no nipple discharge or bleeding, no masses or nodularity palpable  Lungs: clear to auscultation bilaterally  Heart:  regular rate and rhythm, S1, S2 normal, no murmur, click, rub or gallop  Abdomen: soft, non-tender; bowel sounds normal; no masses,  no organomegaly   Vulva:  not evaluated  Vagina: not evaluated  Cervix:  Not evaluated  Corpus: not examined  Adnexa:  not evaluated  Rectal Exam: Not performed.        I have spent a total of 20 minutes of face-to-face time, excluding clinical staff time, reviewing notes and preparing to see patient, ordering tests and/or medications, and counseling the patient.   Assessment:     1. Postpartum care following vaginal delivery (Primary) - doing well  2. Postpartum abnormal glucose tolerance of mother Rx: - Glucose tolerance, 2 hours  3. History of contraception - postpartum bilateral salpingectomy done  4. Class 3 severe obesity due to excess calories without serious comorbidity with body mass index (BMI) of 45.0 to 49.9 in adult - weight reduction with the aid of dietary changes, exercise and behavioral modification recommended   Plan:    1. Contraception: Bilateral Salpingectomy 2. Continue prenatal vitamins 3. Follow up in: 3 months for pap smear or as needed.  2hr GTT for h/o GDM/screening for DM q 3 yrs per ADA recommendations  Healthy lifestyle practices reviewed   CARLIN RONAL CENTERS, MD, FACOG Attending Obstetrician & Gynecologist, [facility] for [facility], [facility], [place] 08/14/2023

## 2023-08-14 NOTE — Progress Notes (Signed)
 Pt presents for pp gtt. Pt has no questions or concerns at this time.

## 2023-08-15 LAB — GLUCOSE TOLERANCE, 2 HOURS
Glucose, 2 hour: 78 mg/dL (ref 70–139)
Glucose, GTT - Fasting: 84 mg/dL (ref 70–99)
